# Patient Record
Sex: Male | Born: 1935 | Race: Black or African American | Hispanic: No | Smoking: Never smoker
Health system: Southern US, Community
[De-identification: ages and names within clinical notes are randomized; demographics above are authoritative.]

## PROBLEM LIST (undated history)

## (undated) DIAGNOSIS — Z973 Presence of spectacles and contact lenses: Secondary | ICD-10-CM

## (undated) DIAGNOSIS — G8929 Other chronic pain: Secondary | ICD-10-CM

## (undated) DIAGNOSIS — E785 Hyperlipidemia, unspecified: Secondary | ICD-10-CM

## (undated) DIAGNOSIS — K625 Hemorrhage of anus and rectum: Secondary | ICD-10-CM

## (undated) DIAGNOSIS — M549 Dorsalgia, unspecified: Secondary | ICD-10-CM

## (undated) DIAGNOSIS — E291 Testicular hypofunction: Secondary | ICD-10-CM

## (undated) DIAGNOSIS — G4733 Obstructive sleep apnea (adult) (pediatric): Secondary | ICD-10-CM

## (undated) DIAGNOSIS — Z9989 Dependence on other enabling machines and devices: Secondary | ICD-10-CM

## (undated) DIAGNOSIS — N529 Male erectile dysfunction, unspecified: Secondary | ICD-10-CM

## (undated) DIAGNOSIS — N183 Chronic kidney disease, stage 3 unspecified: Secondary | ICD-10-CM

## (undated) DIAGNOSIS — K649 Unspecified hemorrhoids: Secondary | ICD-10-CM

## (undated) DIAGNOSIS — M109 Gout, unspecified: Secondary | ICD-10-CM

## (undated) DIAGNOSIS — K219 Gastro-esophageal reflux disease without esophagitis: Secondary | ICD-10-CM

## (undated) DIAGNOSIS — M1990 Unspecified osteoarthritis, unspecified site: Secondary | ICD-10-CM

## (undated) DIAGNOSIS — N401 Enlarged prostate with lower urinary tract symptoms: Secondary | ICD-10-CM

## (undated) DIAGNOSIS — I10 Essential (primary) hypertension: Secondary | ICD-10-CM

## (undated) DIAGNOSIS — K5790 Diverticulosis of intestine, part unspecified, without perforation or abscess without bleeding: Secondary | ICD-10-CM

## (undated) HISTORY — PX: INCISION AND DRAINAGE PERIRECTAL ABSCESS: SHX1804

## (undated) HISTORY — PX: ANAL FISTULOTOMY: SHX6423

## (undated) HISTORY — DX: Male erectile dysfunction, unspecified: N52.9

## (undated) HISTORY — PX: CATARACT EXTRACTION W/ INTRAOCULAR LENS  IMPLANT, BILATERAL: SHX1307

## (undated) HISTORY — PX: TOE SURGERY: SHX1073

## (undated) HISTORY — DX: Testicular hypofunction: E29.1

## (undated) HISTORY — DX: Unspecified osteoarthritis, unspecified site: M19.90

## (undated) HISTORY — DX: Dorsalgia, unspecified: M54.9

## (undated) HISTORY — DX: Hyperlipidemia, unspecified: E78.5

## (undated) HISTORY — PX: OTHER SURGICAL HISTORY: SHX169

## (undated) HISTORY — DX: Other chronic pain: G89.29

## (undated) HISTORY — PX: INGUINAL HERNIA REPAIR: SUR1180

---

## 1981-12-23 HISTORY — PX: TRANSURETHRAL RESECTION OF PROSTATE: SHX73

## 1991-12-24 HISTORY — PX: UVULOPALATOPHARYNGOPLASTY: SHX827

## 1999-07-05 ENCOUNTER — Ambulatory Visit (HOSPITAL_COMMUNITY): Admission: RE | Admit: 1999-07-05 | Discharge: 1999-07-05 | Payer: Self-pay | Admitting: Internal Medicine

## 1999-07-05 ENCOUNTER — Encounter: Payer: Self-pay | Admitting: Internal Medicine

## 2001-04-10 ENCOUNTER — Ambulatory Visit (HOSPITAL_COMMUNITY): Admission: RE | Admit: 2001-04-10 | Discharge: 2001-04-10 | Payer: Self-pay | Admitting: Dentistry

## 2001-04-10 ENCOUNTER — Encounter: Payer: Self-pay | Admitting: Dentistry

## 2001-07-29 ENCOUNTER — Encounter (INDEPENDENT_AMBULATORY_CARE_PROVIDER_SITE_OTHER): Payer: Self-pay | Admitting: *Deleted

## 2001-07-29 ENCOUNTER — Ambulatory Visit (HOSPITAL_COMMUNITY): Admission: RE | Admit: 2001-07-29 | Discharge: 2001-07-30 | Payer: Self-pay | Admitting: Otolaryngology

## 2001-07-29 ENCOUNTER — Encounter: Payer: Self-pay | Admitting: Otolaryngology

## 2001-07-29 HISTORY — PX: NASAL SINUS SURGERY: SHX719

## 2003-02-15 ENCOUNTER — Ambulatory Visit (HOSPITAL_BASED_OUTPATIENT_CLINIC_OR_DEPARTMENT_OTHER): Admission: RE | Admit: 2003-02-15 | Discharge: 2003-02-15 | Payer: Self-pay | Admitting: Internal Medicine

## 2003-03-22 ENCOUNTER — Inpatient Hospital Stay (HOSPITAL_COMMUNITY): Admission: EM | Admit: 2003-03-22 | Discharge: 2003-03-24 | Payer: Self-pay | Admitting: Emergency Medicine

## 2003-03-22 ENCOUNTER — Encounter: Payer: Self-pay | Admitting: General Surgery

## 2003-05-30 ENCOUNTER — Ambulatory Visit (HOSPITAL_BASED_OUTPATIENT_CLINIC_OR_DEPARTMENT_OTHER): Admission: RE | Admit: 2003-05-30 | Discharge: 2003-05-30 | Payer: Self-pay | Admitting: Internal Medicine

## 2003-07-04 ENCOUNTER — Ambulatory Visit (HOSPITAL_COMMUNITY): Admission: RE | Admit: 2003-07-04 | Discharge: 2003-07-04 | Payer: Self-pay | Admitting: General Surgery

## 2003-07-04 ENCOUNTER — Encounter (INDEPENDENT_AMBULATORY_CARE_PROVIDER_SITE_OTHER): Payer: Self-pay | Admitting: Specialist

## 2003-12-14 ENCOUNTER — Encounter: Payer: Self-pay | Admitting: Gastroenterology

## 2004-05-22 ENCOUNTER — Encounter: Admission: RE | Admit: 2004-05-22 | Discharge: 2004-05-22 | Payer: Self-pay | Admitting: Internal Medicine

## 2004-06-12 ENCOUNTER — Ambulatory Visit (HOSPITAL_COMMUNITY): Admission: RE | Admit: 2004-06-12 | Discharge: 2004-06-12 | Payer: Self-pay | Admitting: Internal Medicine

## 2004-08-14 ENCOUNTER — Ambulatory Visit (HOSPITAL_BASED_OUTPATIENT_CLINIC_OR_DEPARTMENT_OTHER): Admission: RE | Admit: 2004-08-14 | Discharge: 2004-08-14 | Payer: Self-pay | Admitting: Surgery

## 2004-08-14 ENCOUNTER — Ambulatory Visit (HOSPITAL_COMMUNITY): Admission: RE | Admit: 2004-08-14 | Discharge: 2004-08-14 | Payer: Self-pay | Admitting: Surgery

## 2004-08-17 ENCOUNTER — Encounter: Admission: RE | Admit: 2004-08-17 | Discharge: 2004-08-17 | Payer: Self-pay | Admitting: Internal Medicine

## 2004-09-27 ENCOUNTER — Ambulatory Visit (HOSPITAL_COMMUNITY): Admission: RE | Admit: 2004-09-27 | Discharge: 2004-09-27 | Payer: Self-pay | Admitting: Internal Medicine

## 2006-09-03 ENCOUNTER — Emergency Department (HOSPITAL_COMMUNITY): Admission: EM | Admit: 2006-09-03 | Discharge: 2006-09-04 | Payer: Self-pay | Admitting: Emergency Medicine

## 2007-01-17 ENCOUNTER — Emergency Department (HOSPITAL_COMMUNITY): Admission: EM | Admit: 2007-01-17 | Discharge: 2007-01-18 | Payer: Self-pay | Admitting: Emergency Medicine

## 2008-04-04 ENCOUNTER — Ambulatory Visit: Payer: Self-pay | Admitting: Gastroenterology

## 2008-05-05 DIAGNOSIS — I10 Essential (primary) hypertension: Secondary | ICD-10-CM | POA: Insufficient documentation

## 2008-05-05 DIAGNOSIS — Z9889 Other specified postprocedural states: Secondary | ICD-10-CM | POA: Insufficient documentation

## 2008-05-05 DIAGNOSIS — M129 Arthropathy, unspecified: Secondary | ICD-10-CM | POA: Insufficient documentation

## 2008-05-05 DIAGNOSIS — K5900 Constipation, unspecified: Secondary | ICD-10-CM | POA: Insufficient documentation

## 2008-05-05 DIAGNOSIS — G4730 Sleep apnea, unspecified: Secondary | ICD-10-CM | POA: Insufficient documentation

## 2008-05-09 ENCOUNTER — Ambulatory Visit: Payer: Self-pay | Admitting: Gastroenterology

## 2008-05-09 DIAGNOSIS — R12 Heartburn: Secondary | ICD-10-CM

## 2008-05-09 DIAGNOSIS — R142 Eructation: Secondary | ICD-10-CM

## 2008-05-09 DIAGNOSIS — R141 Gas pain: Secondary | ICD-10-CM

## 2008-05-09 DIAGNOSIS — R143 Flatulence: Secondary | ICD-10-CM

## 2010-01-16 ENCOUNTER — Ambulatory Visit (HOSPITAL_BASED_OUTPATIENT_CLINIC_OR_DEPARTMENT_OTHER): Admission: RE | Admit: 2010-01-16 | Discharge: 2010-01-16 | Payer: Self-pay | Admitting: Orthopedic Surgery

## 2011-05-07 NOTE — Letter (Signed)
April 04, 2008    Mr. Marvin West   RE:  Marvin West, Marvin West  MRN:  [id number]  /  DOB:  06/04/1934   Dear Mr. Kyllonen:   It is my pleasure to have treated you recently as a new patient in my  office.  I appreciate your confidence and the opportunity to participate  in your care.   Since I do have a busy inpatient endoscopy schedule and office schedule,  my office hours vary weekly.  I am, however, available for emergency  calls every day through my office.  If I cannot promptly meet an urgent  office appointment, another one of our gastroenterologists will be able  to assist you.   My well-trained staff are prepared to help you at all times.  For  emergencies after office hours, a physician from our gastroenterology  section is always available through my 24-hour answering service.   While you are under my care, I encourage discussion of your questions  and concerns, and I will be happy to return your calls as soon as I am  available.   Once again, I welcome you as a new patient and I look forward to a happy  and healthy relationship.    Sincerely,      Barbette Hair. Arlyce Dice, MD,FACG  Electronically Signed   RDK/MedQ  DD: 04/04/2008  DT: 04/04/2008  Job #: [id number]

## 2011-05-07 NOTE — Assessment & Plan Note (Signed)
[facility]                         GASTROENTEROLOGY OFFICE NOTE   NAME:West, Marvin SKEENS                        MRN:          [id number]  DATE:04/04/2008                            DOB:          Jun 18, 1934    PROBLEM:  Abdominal bloating and constipation.   Marvin West is a 77 year old African American man here for evaluation of  abdominal bloating.  Marvin West suffers from mild chronic constipation.  Marvin West has  significant bloating daily which is only relieved after a good bowel  movement.  Marvin West has taken various over-the-counter remedies for his  constipation.  This has been on going problem for years.  If Marvin West gets  severely constipated then Marvin West develops pyrosis.  Marvin West apparently has had  colonoscopy within the last 3-4 years, though I have no documentation of  this.  Marvin West rarely sees blood in the toilet after straining.  Marvin West is status  post hemorrhoidectomy and fissurectomy in 2004, 2005.   PAST MEDICAL HISTORY:  1. Pertinent for hypertension.  2. Arthritis.  3. Sleep apnea.  4. Status post herniorrhaphy.   FAMILY HISTORY:  Noncontributory.   MEDICATIONS:  Include hydrochlorothiazide, Avapro, nabumetone, Caduet,  fexofenadine, metoprolol, and steroid nasal spray.   Marvin West is allergic to SULFA.   Marvin West does not smoke cigarettes.  Marvin West has about a drink a week.  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   REVIEW OF SYSTEMS:  Positive for joint pains and arthritis.  Otherwise,  other systems are negative.   PHYSICAL EXAMINATION:  Marvin West is a healthy-appearing male.  Pulse 64.  Blood pressure 104/70.  Weight 188.  HEENT:  EOMI.  PERRLA.  Sclerae are anicteric.  Conjunctivae are pink.  NECK:  Supple without thyromegaly, adenopathy or carotid bruits.  CHEST:  Clear to auscultation and percussion without adventitious  sounds.  CARDIAC:  Regular rhythm; normal S1 S2.  There are no murmurs, gallops  or rubs.  ABDOMEN:  Marvin West has mildly tympanotic and protuberant abdomen.  There are  no abdominal masses or organomegaly.  EXTREMITIES:  Full range of motion.  No cyanosis, clubbing or edema.  RECTAL:  Deferred.  MUSCULOSKELETAL:  There are no deformities.  NEUROLOGIC:  Marvin West is alert and without focal abnormalities.   IMPRESSION:  Abdominal bloating.  This is probably related to  constipation.  Marvin West defines constipation is having an incomplete bowel  movement, although Marvin West does move daily.  Structural lesion in  the colon  is much less likely in view of his colonoscopy if, in fact, Marvin West has had  one the last 2-3 years.   RECOMMENDATION:  1. Fiber supplementation.  2. Amitiza 24 mcg twice a day as needed.     Barbette Hair. Arlyce Dice, MD,FACG  Electronically Signed    RDK/MedQ  DD: 04/04/2008  DT: 04/04/2008  Job #: [id number]   cc:   Thora Lance, M.D.

## 2011-05-07 NOTE — Letter (Signed)
April 04, 2008    Thora Lance, M.D.  [address]  [place], [place] [address]   RE:  Marvin West, Marvin West  MRN:  [id number]  /  DOB:  Nov 14, 1934   Dear Dr. Valentina Lucks:   Upon your kind referral, I had the pleasure of evaluating your patient  and I am pleased to offer my findings.  I saw Marvin West in the office  today.  Enclosed is a copy of my progress note that details my findings  and recommendations.   Thank you for the opportunity to participate in your patient's care.    Sincerely,      Barbette Hair. Arlyce Dice, MD,FACG  Electronically Signed    RDK/MedQ  DD: 04/04/2008  DT: 04/04/2008  Job #: [id number]

## 2011-05-10 NOTE — Op Note (Signed)
NAME:  Marvin West, Marvin West                             ACCOUNT NO.:  [id number]   MEDICAL RECORD NO.:  [id number]                   PATIENT TYPE:  INP   LOCATION:  5727                                 FACILITY:  [facility]   PHYSICIAN:  Gabrielle Dare. Janee Morn, M.D.             DATE OF BIRTH:  01-07-1934   DATE OF PROCEDURE:  03/22/2003  DATE OF DISCHARGE:                                 OPERATIVE REPORT   PREOPERATIVE DIAGNOSIS:  Perirectal abscess.   POSTOPERATIVE DIAGNOSES:  1. Perirectal abscess.  2. Internal hemorrhoids.   PROCEDURE:  Examination under anesthesia and I & D of perirectal abscess.   SURGEON:  Gabrielle Dare. Janee Morn, M.D.   ANESTHESIA:  General.   HISTORY OF PRESENT ILLNESS:  The patient is a 77 year old African-American  male who presented to our urgent office and saw Dr. Jerelene Redden with a 5-6  day history of perirectal swelling and pain, which has been increasing.  He  was diagnosed there with a perirectal abscess and referred to myself for  incision and drainage in the operating room.   PROCEDURE IN DETAIL:  The patient was brought to the operating room. He had  received intravenous antibiotics.  General anesthesia was administered.  He  was placed in the lithotomy position, and his perirectal area was prepped  and draped in a sterile fashion.  Initially, a digital rectal exam revealed  no abnormalities.  On anoscopy, he was noted to have some small internal  hemorrhoids that were not significantly inflamed.  No internal fistula  opening was noted.  Local anesthetic in the form of 0.5% Marcaine with  epinephrine was injected into the abscess area, and no effluent returned  from any portion of the rectal mucosa.  The anoscope was removed, and  subsequently, the most fluctuant area of this perirectal abscess was incised  sharply.  At this point, a 2 cm incision was made.  Copious purulent  material was returned, which was sent for culture.  Using blunt dissection,  the  abscess cavity was opened completely.  It did track around it in a  horseshoe abscess fashion across the midline, only for a very small length,  approximately 0.5 cm, going over to the left side.  All cavities were  connected.  The area was copiously irrigated with saline.  Meticulous  hemostasis was obtained with Bovie cautery.  The area was again injected  with 0.5% Marcaine with epinephrine for postoperative pain relief and then  packed with Iodoform packing and gauze. A sterile dressing was then applied.  Instrument, sponge, and needle counts were correct.  The patient was taken  to the recovery room in stable condition.  Gabrielle Dare Janee Morn, M.D.    BET/MEDQ  D:  03/22/2003  T:  03/23/2003  Job:  [id number]

## 2011-05-10 NOTE — Op Note (Signed)
[place]  Patient:    Marvin West, Marvin West                          MRN: [id number] Adm. Date:  [id number] Attending:  Waldon Merl CC:         Thora Lance, M.D., [facility] at [facility], [address], [place] [address]   Operative Report  PREOPERATIVE DIAGNOSIS:  Right ethmoid and maxillary sinusitis with nasal and maxillary and ethmoid polyposis, rule out inverted papilloma, rule out _____ rule out malignancy.  POSTOPERATIVE DIAGNOSIS:  Right ethmoid and maxillary sinusitis with nasal polyposis and no evidence of malignancy or inverted papilloma on frozen section.  PROCEDURES:  Functional endoscopic sinus surgery, right ethmoidectomy, and a right maxillary sinus ostial enlargement with antrostomy, with polypectomy, with partial removal of medial sinus wall and reduction of turbinates.  SURGEON:  Keturah Barre, M.D.  ANESTHESIA:  General with Marvin West, M.D.  DESCRIPTION OF PROCEDURE:  Patient placed in a supine position.  Under general anesthesia, the nose was anesthetized using 1% Xylocaine with epinephrine and topical cocaine 200 mg.  The polyp was extremely large, pushing the middle turbinate and the septum to the left, and this was in direct contact and had eroded the medial wall of the maxillary sinus near the natural ostium of the maxillary sinus and up into the ethmoid sinus.  This polypoid debris was removed, and this was also removed into the inferior ethmoid sinus near the ethmoid bulla, and considerable tissue was sent for frozen section.  Our dissection of the ethmoid was continued using the 0 degree scope and the 70 degree scope with the upbiting and straight Blakesley-Wildes, and the ethmoid sinus _____ was taken down so we could see into the frontal sinus, and we could see posteriorly and superiorly.  All tissues left behind were completely normal, and during this  procedure and during the time we were using the scope, we had analyzed and evaluated our margins so we know exactly where we can remove this if we have a malignancy or inverted papilloma result.  Once we had completed the ethmoid plane of resection of this polypoid debris, we also saw a considerable of purulent drainage coming from the maxillary sinus, suctioned it, and cultured it for fungal, anaerobic, and aerobic.  Once we suctioned the sinus, then we could work our way down to this natural ostium in the maxillary sinus where the considerable portion of the bony maxillary sinus medial wall was eroded, and this was taken along with the soft tissue, as this was all severely diseased, thickened, and irregular-appearing polypoid tissue.  Before we got the frozen section, I felt that this most likely was inverted papilloma but when the frozen section was called back, it was found to be inflammatory polyposis only.  We therefore removed the remainder of the nasal and maxillary sinus polyps.  We did an antrostomy on the sinus.  We irrigated the maxillary sinus gently and then suctioned it out clear.  As we looked into the maxillary sinus mucous membrane, it really looked quite reasonable at its more inferior and lateral wall, though there was considerable thickening as it was sitting right in contact with purulent material.  Once this was cleared and the natural ostium was made approximately 10 times larger than its normal size because of the extensive bony erosion and the tissues that were so irreversibly changed  that were removed with the specimen, then we packed using Gelfoam.  We also used iodoform packing in the natural ostium as well as a Merocel pack.  The patient was to be kept overnight for observation and did extremely well during the anesthesia.  The follow-up will be tomorrow, and then he will be followed next week and then in two weeks, four weeks, six weeks, three months, six  months, and a year. DD:  07/29/01 TD:  07/29/01 Job: [id number]

## 2011-05-10 NOTE — Op Note (Signed)
NAME:  Marvin West, Marvin West                           ACCOUNT NO.:  [id number]   MEDICAL RECORD NO.:  [id number]                   PATIENT TYPE:  AMB   LOCATION:  DAY                                  FACILITY:  [facility]   PHYSICIAN:  Gabrielle Dare. Janee Morn, M.D.             DATE OF BIRTH:  12/02/1934   DATE OF PROCEDURE:  07/04/2003  DATE OF DISCHARGE:                                 OPERATIVE REPORT   PREOPERATIVE DIAGNOSES:  1. Anal fistula.  2. Anal skin tags.  3. Internal hemorrhoids.   POSTOPERATIVE DIAGNOSES:  1. Anal fistula.  2. Anal skin tags.  3. Internal hemorrhoids.   PROCEDURES:  1. Endorectal ultrasound.  2. Examination under anesthesia.  3. Anal fistulotomy.  4. Banding of internal hemorrhoids.  5. Excision of anal skin tags.   SURGEONS:  1. Gabrielle Dare. Janee Morn, M.D.  2. Marcial Pacas E. Earlene Plater, M.D.   ANESTHESIA:  General.   HISTORY OF PRESENT ILLNESS:  The patient is a 77 year old, African-American  male, whom I initially met when he presented with a very large, horseshoe  perirectal abscess which was drained operatively when he was in the hospital  for a couple of days.  On follow-up, once the inflammation cooled down, we  were able to find an anal fistula as the likely cause.  He initially just  wanted to do some local wound care and not have it treated operatively, but  it has persisted and is bothering him.  He is also troubled by some anal  skin tags from hemorrhoids in the past as well as one internal hemorrhoid.  He presents today for anorectal ultrasound examination under anesthesia,  anal fistulotomy, treatment of his internal hemorrhoid, and excision of his  anal skin tags.   PROCEDURE IN DETAIL:  Informed consent was obtained.  He received  intravenous antibiotics and was taken to the operating room.  General  anesthesia was administered.  He was placed in lithotomy and first Timothy  E. Earlene Plater, M.D. proceeded with indirect ultrasound.  There was a small tract  visualized posteriorly, likely the fistula tract.  There was no surrounding  collection of fluid or other evidence of abscess.  No other abnormalities  were noted.  That will be dictated by Dr. Earlene Plater under a separate note.  Subsequently, the area was prepped and draped in a sterile fashion.  He was  noted to have a large amount of anal skin tags, especially around the left  side from the anterior to posterior.  Two large flaps were noted.  Subsequently, I made endoscopic exam.  Laterally and anteriorly, things  looked pretty normal.  Posteriorly, he had one internal hemorrhoid which was  pretty large.  He also had the opening of his anal fistula.  The fistula  also had an opening posterolaterally with some granulation tissue heaped up  around there.  A probe was inserted through this area and  exited into the  rectum at the opening internally.  We then proceeded to fillet open this  fistula with the Bovie cautery.  Once that was accomplished, the very heaped  up granulation tissue was excised, and the whole fistula tract was  cauterized to fulgurate it nicely and obtained good hemostasis.  Once that  was accomplished, we directed our attention to the internal hemorrhoid.  This was grasped with forceps and the hemorrhoid banding tool was used to  apply one rubber band and hemorrhoid was nicely secured and was about 1 cm  in size.  The band was in good position around its base.  Subsequently, I  directed my attention to excision of the anal skin tags.  This was done  sharply, and there were two large tags, starting anteriorly along the left  side and then extending posteriorly.  Both of these were removed and sent to  pathology.  Hemostasis was obtained with Bovie cautery and then the anoderm  was reapproximated with a series of interrupted 3-0 Vicryl sutures.  The  area was copiously irrigated.  Meticulous hemostasis was ensured, and two  Gelfoam sponges were inserted for postoperative  dressing.  Please note that  0.5% Marcaine mixed with 1% lidocaine with epinephrine was noted for local  anesthesia, and the area was infiltrated before the initial anoscopy.  Sterile dressings were applied.  Sponge, needle, and instrument counts were  correct, and he was taken to the recovery room in stable condition.                                               Gabrielle Dare Janee Morn, M.D.    BET/MEDQ  D:  07/04/2003  T:  07/04/2003  Job:  [id number]

## 2011-05-10 NOTE — Op Note (Signed)
   NAME:  Marvin West, Marvin West                           ACCOUNT NO.:  [id number]   MEDICAL RECORD NO.:  [id number]                   PATIENT TYPE:  AMB   LOCATION:  DAY                                  [facility]ACILITY[facility]   PHYSICIAN:  Timothy E. Earlene Plater, M.D.              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  2. Hemorrhoids.   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  2. Hemorrhoids.   PROCEDURE:  Anorectal ultrasonography.   SURGEON:  Timothy E. Earlene Plater, M.D.   ASSISTANT:  Gabrielle Dare. Janee Morn, M.D.   ANESTHESIA:  General.   INDICATIONS FOR PROCEDURE:  See the details on Mr. Schubring from Dr. Carollee Massed  report.  He is known to have a remote perirectal abscess and a current  fistula-in-ano as well hemorrhoids.  I have been asked to accomplish a  rectal ultrasonography.   DESCRIPTION OF PROCEDURE:  The patient is asleep in lithotomy position.  The  perianal area is carefully inspected.  Gentle digital exam of the rectum.  To the right posterior of the posterior midline, approximately 1.5 cm  outside the anal verge, is an obvious fistula-in-ano.  Digital exam reveals  no intrarectal abnormalities.  The solid anal probe model 2102 BNK  ultrasonography is lubricated and inserted gently to the anus, and multiple  images are made.  I think that a right posterior to the posterior midline  fistula is demonstrated.  There is no apparent accumulation or abscess  cavity.  This is an irregular, hyperechoic serpiginous apparent fistula.  There are no other abnormalities noted on this exam.                                               Timothy E. Earlene Plater, M.D.    TED/MEDQ  D:  07/04/2003  T:  07/04/2003  Job:  [id number]

## 2011-05-10 NOTE — Op Note (Signed)
NAME:  Marvin West, Marvin West                           ACCOUNT NO.:  [id number]   MEDICAL RECORD NO.:  [id number]                   PATIENT TYPE:  AMB   LOCATION:  DSC                                  FACILITY:  [facility]   PHYSICIAN:  Abigail Miyamoto, M.D.              DATE OF BIRTH:  1934/10/16   DATE OF PROCEDURE:  08/14/2004  DATE OF DISCHARGE:                                 OPERATIVE REPORT   PREOPERATIVE DIAGNOSIS:  Right inguinal hernia.   POSTOPERATIVE DIAGNOSIS:  Right inguinal hernia.   PROCEDURE:  Right inguinal hernia repair with mesh.   SURGEON:  Abigail Miyamoto, M.D.   ANESTHESIA:  Marcaine 0.25% and LMA.   ESTIMATED BLOOD LOSS:  Minimal.   INDICATIONS FOR PROCEDURE:  Marvin West is a 77 year old gentleman who  presents with a right inguinal hernia.  Preoperatively he was found to have  a creatinine of 2.3. I discussed this with his primary care physician, Dr.  Thora Lance, and the patient does have some chronic renal insufficiency.  At this point, we discussed this with anesthesia as well and all agreed that  we could proceed with hernia repair.   FINDINGS:  The patient was found to have a moderate sized indirect inguinal  hernia.   DESCRIPTION OF PROCEDURE:  The patient was brought to the operating room and  identified as Marvin West.  He is placed supine on the operating table and  anesthesia was induced.  His abdomen was then prepped and draped in the  usual sterile fashion.  Marcaine 0.5% was then used to perform the  ilioinguinal nerve block as well as anesthetize the skin and the right  groin.  Next, a longitudinal incision was made in the right groin with #15  blade.  Incision was carried down through Scarpa's fascia with the  electrocautery. The external oblique fascia was then identified and opened  with cautery, then further with the Metzenbaum scissors toward the internal  and external rings.  The testicular cord and its structures were then easily  identified and dissected free and controlled with a Penrose drain.  The  inguinal floor was found to be strong and intact.  The patient had a  moderate sized indirect hernia sac in the cord structures.  The hernia sac  was identified and controlled with hemostats and then separated from the  surrounding cord structures with blunt dissection as well as with the  electrocautery.  The sac was opened up and no contents were found to be  contained from the peritoneal cavity.  The base of the hernia sac was then  dissected free and closed with a 2-0 silk suture. The redundant sac was then  excised with electrocautery.  The preperitoneal space was then dissected out  with finger dissection.  A piece of large Prolene mesh from the [employer]  Prolene Hernia System was brought onto the field.  The preperitoneal portion  was then placed at the internal ring and opened up at the preperitoneal  space.  The overlay portion was then opened up into the groin.  The mesh was  then sewn in place into the right inguinal floor by first sewing it to the  pubic tubercle with a 2-0 Vicryl suture.  It was then sewn to the  transversalis fascia with 2-0 Vicryl sutures and the shelving edge of the  inguinal ligament with 2-0 Vicryl sutures.  A slit was then cut in the mesh  and it was incorporated around the cord structures with the 2-0 Vicryl as  well.  Good coverage of the inguinal floor appeared to be achieved.  The  external oblique fascia was then closed over this with a running 2-0 Vicryl  suture as well.  Scarpa's fascia was closed with interrupted 3-0 Vicryl  sutures and skin was closed with running 3-0 Vicryl suture as well.  Steri-  Strips, gauze and tape were then applied.  The patient tolerated the procedure well.  All sponge, needle and instrument  counts were correct at the end of the procedure. The patient was then  extubated in the operating room and taken in a stable condition to the  recovery  room.                                               Abigail Miyamoto, M.D.    DB/MEDQ  D:  08/14/2004  T:  08/14/2004  Job:  [id number]

## 2011-05-10 NOTE — H&P (Signed)
NAME:  Marvin West, Marvin West                             ACCOUNT NO.:  [id number]   MEDICAL RECORD NO.:  [id number]                   PATIENT TYPE:  INP   LOCATION:  1844                                 FACILITY:  [facility]   PHYSICIAN:  Gabrielle Dare. Janee Morn, M.D.             DATE OF BIRTH:  11/04/1934   DATE OF ADMISSION:  03/22/2003  DATE OF DISCHARGE:                                HISTORY & PHYSICAL   CHIEF COMPLAINT:  Perirectal abscess.   HISTORY OF PRESENT ILLNESS:  The patient is a 77 year old African American  male who developed some pain and swelling in his perianal area on the right  side approximately five to six days ago.  He has increasing pain and size of  the tender area.  He was evaluated in our urgent office at [facility] by Dr. Gita Kudo, referred for operative intervention.  The  patient has not noted any blood per rectum and he denies any bowel  complaints.  He just complains of pain in the area and he has not had any  drainage.   PAST MEDICAL HISTORY:  This includes hypertension and hypercholesterolemia.   PAST SURGICAL HISTORY:  This includes multiple chest wall mass.   MEDICATIONS:  Lipitor, Allegra, Labetalol, hydrochlorothiazide, Norvasc, and  aspirin.   SOCIAL HISTORY:  He does not smoke or drink alcohol.  He is married.   ALLERGIES:  SULFA.   REVIEW OF SYSTEMS:  In general, no complaints.  CARDIOVASCULAR:  No  complaints.  PULMONARY:  No complaints.  GI:  No complaints, please refer to  the history of present illness.  MUSCULOSKELETAL:  He has no complaints.  The rest of the review of systems is negative.   PHYSICAL EXAMINATION:  GENERAL APPEARANCE:  He is awake, alert and in mild  discomfort.  VITAL SIGNS:  Temperature 98.2, blood pressure 155/78, pulse 116,  respiratory rate 20.  HEENT:  His extraocular muscles are intact.  Pupils are equal and reactive.  NECK:  Supple.  CHEST:  Clear to auscultation bilaterally.  CARDIOVASCULAR:   Regular rate and rhythm.  ABDOMEN:  Soft, nontender and nondistended.  RECTAL:  The patient is refusing internal rectal examination secondary to  his pain.  He does have a very tender, warm fluctuant area in his right  perianal region.  There is no active discharge and rectal examination is  deferred until the operating room.  This area is about 5 cm in size.   LABORATORY DATA:  The laboratory studies reviewed show his white blood cell  count is 16,700, hemoglobin 13, platelets 213.  A CMP and urinalysis are  pending.   ASSESSMENT:  Perirectal abscess.    PLAN:  Take him to the operating room for an emergent incision and drainage.  We will give him intravenous antibiotics and plan on admitting him at least  over night.  The procedure risks and  benefits were discussed with the  patient and he is eager to proceed.                                               Gabrielle Dare Janee Morn, M.D.    BET/MEDQ  D:  03/22/2003  T:  03/22/2003  Job:  [id number]

## 2011-05-10 NOTE — Discharge Summary (Signed)
   NAME:  Marvin West, Marvin West                             ACCOUNT NO.:  [id number]   MEDICAL RECORD NO.:  [id number]                   PATIENT TYPE:  INP   LOCATION:  5727                                 FACILITY:  [facility]   PHYSICIAN:  Gabrielle Dare. Janee Morn, M.D.             DATE OF BIRTH:  May 05, 1934   DATE OF ADMISSION:  03/22/2003  DATE OF DISCHARGE:  03/24/2003                                 DISCHARGE SUMMARY   DISCHARGE DIAGNOSIS:  Status post incision and drainage of perirectal  abscess.   HISTORY OF PRESENT ILLNESS:  The patient is a 77 year old African-American  male who had increasing pain and swelling for five to six days in his  perirectal area.  He was evaluated by Dr. Gita Kudo in our urgent  office and he referred him to myself at [facility] for an urgent operative  incision and drainage of this perirectal abscess.   HOSPITAL COURSE:  The patient was admitted to hospital through the emergency  department and brought to the operating where he underwent examination under  anesthesia and incision of drainage of a perirectal abscess.  He tolerated  procedure well.  Postoperatively, his packing was changed and he remained  hemodynamically stable.  He is initially had a fever of 102 the night of  surgery. He gradually defervesced. He was maintained on intravenous Cipro  and Flagyl.  He continued to tolerate his diet.  He is placed on his home  medications by Dr. Valentina Lucks who was kind enough to come by and see the  patient while he was here and otherwise had some initial difficulty passing  his urine, but that resolved as well.  Home health services was consulted  and they set up home dressing changes for packing of the wound and he was  switched to p.o. antibiotics, remained afebrile and is discharged home in  stable condition.   DISCHARGE DIET:  As tolerated.   DISCHARGE ACTIVITY:  As tolerated with home  health for wound care, packing  his small wound opening daily.  The  patient is also going to take sitz bath  daily prior to the daily change.   FOLLOW UP:  The patient will follow up with me in the office next Wednesday  morning.                                               Gabrielle Dare Janee Morn, M.D.    BET/MEDQ  D:  03/24/2003  T:  03/25/2003  Job:  [id number]

## 2011-05-10 NOTE — H&P (Signed)
[place]  Patient:    Marvin West, Marvin West                          MRN: [id number] Adm. Date:  [id number] Attending:  Waldon Merl CC:         Thora Lance, M.D.   History and Physical  This patient is a 77 year old male who enters with a history of sinusitis problems.  He has been treated with antibiotics on several occasions for the sinus difficulties and a CAT scan was done after he did not resolve this very well and he had continuing sinus purulent drainage.  It showed soft tissue opacification of the right maxillary antrum with soft tissue extending through the medial antral wall.  They also showed involvement in the ethmoid sinus inferiorly and some slight changes even in the right frontal sinus.  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 He has had a history of deviated nasal septum and obstructive sleep apnea.  He has also had a history of allergic rhinitis and smokes tobacco occasionally. ALLERGIES:  SULFA.  MEDICATIONS: 1. Avapro 300 mg. 2. Lipitor 20 mg h.s. 3. Labetalol 200 mg b.i.d. 4. Hydrochlorothiazide 25 mg q.a.m. 5. Allegra 60 mg. 6. Androgen 1%. 7. ______ 0.5% q.a.m.  PAST MEDICAL HISTORY:  Blood pressure elevation.  He has had no evidence of any myocardial infarction.  His EKG was normal sinus rhythm.  His blood work shows a hemoglobin of 11.9 and a hematocrit of 35.4 with normal platelets and a normal electrolyte status.  PAST SURGICAL HISTORY:  Prostate surgery in 1980, surgery for sleep apnea in 1993, toe surgery right and left in 1995.  He had a chest lumpectomy done in 1970s which was benign.  REVIEW OF SYSTEMS:  His respiratory status is really quite excellent. UROLOGIC:  Excellent.  ENDOCRINE:  Stable.  PHYSICAL EXAMINATION  VITAL SIGNS:  Blood pressure 130/70, heart rate 88, weight  195 pounds, height 70 inches.  GENERAL:  He is a well-nourished, well-developed black male in no acute distress.  HEENT:  Ears are clear.  Tympanic membranes look excellent.  His nose shows swelling on the right side along the medial wall of the maxillary sinus and lateral nose.  His inferior turbinate appears to be in good condition and the middle turbinate appears to be quite medially pushed within the nose because of this fullness.  His left nose looks totally normal.  The septum is slightly pushed to the left also.  Nasopharynx is clear.  Oral cavity is also clear. No ulceration or mass.  True cords, false cords, epiglottis, face, and tongue are clear.  There is no evidence of any cranial nerve abnormalities.  True cord mobility is excellent.  EOMs are normal.  Tongue mobility, gag reflex, shoulder strength are all within normal limits.  NECK:  Free of any thyromegaly, cervical adenopathy, or mass.  CHEST:  Clear.  No rales, rhonchi, or wheezes.  CARDIOVASCULAR:  No ______ or gallops.  ABDOMEN:  Free of organomegaly, tenderness, or mass.  EXTREMITIES:  Unremarkable except for his previous toe surgery.  DIAGNOSES: 1. 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  Rule out malignancy.  Rule out    ______ mycosis, right side. 2. Hypertension. 3. Chronic constipation. 4. Throat tightness  symptom. 5. Sinusitis. DD:  07/29/01 TD:  07/29/01 Job: [id number]

## 2011-08-20 ENCOUNTER — Ambulatory Visit (INDEPENDENT_AMBULATORY_CARE_PROVIDER_SITE_OTHER): Payer: Medicare Other | Admitting: Psychology

## 2011-08-20 DIAGNOSIS — F4321 Adjustment disorder with depressed mood: Secondary | ICD-10-CM

## 2011-09-03 ENCOUNTER — Ambulatory Visit (INDEPENDENT_AMBULATORY_CARE_PROVIDER_SITE_OTHER): Payer: Medicare Other | Admitting: Psychology

## 2011-09-03 DIAGNOSIS — F4321 Adjustment disorder with depressed mood: Secondary | ICD-10-CM

## 2011-09-12 ENCOUNTER — Ambulatory Visit (INDEPENDENT_AMBULATORY_CARE_PROVIDER_SITE_OTHER): Payer: Medicare Other | Admitting: Psychology

## 2011-09-12 DIAGNOSIS — F4321 Adjustment disorder with depressed mood: Secondary | ICD-10-CM

## 2011-09-19 ENCOUNTER — Ambulatory Visit (INDEPENDENT_AMBULATORY_CARE_PROVIDER_SITE_OTHER): Payer: Medicare Other | Admitting: Psychology

## 2011-09-19 DIAGNOSIS — F4321 Adjustment disorder with depressed mood: Secondary | ICD-10-CM

## 2011-09-26 ENCOUNTER — Ambulatory Visit (INDEPENDENT_AMBULATORY_CARE_PROVIDER_SITE_OTHER): Payer: Medicare Other | Admitting: Psychology

## 2011-09-26 DIAGNOSIS — F4323 Adjustment disorder with mixed anxiety and depressed mood: Secondary | ICD-10-CM

## 2011-10-15 ENCOUNTER — Ambulatory Visit (INDEPENDENT_AMBULATORY_CARE_PROVIDER_SITE_OTHER): Payer: Medicare Other | Admitting: Psychology

## 2011-10-15 DIAGNOSIS — F4323 Adjustment disorder with mixed anxiety and depressed mood: Secondary | ICD-10-CM

## 2011-10-17 ENCOUNTER — Ambulatory Visit: Payer: Medicare Other | Admitting: Psychology

## 2011-10-22 ENCOUNTER — Ambulatory Visit (INDEPENDENT_AMBULATORY_CARE_PROVIDER_SITE_OTHER): Payer: Medicare Other | Admitting: Psychology

## 2011-10-22 DIAGNOSIS — F4323 Adjustment disorder with mixed anxiety and depressed mood: Secondary | ICD-10-CM

## 2011-10-31 ENCOUNTER — Ambulatory Visit (INDEPENDENT_AMBULATORY_CARE_PROVIDER_SITE_OTHER): Payer: Medicare Other | Admitting: Psychology

## 2011-10-31 DIAGNOSIS — F4323 Adjustment disorder with mixed anxiety and depressed mood: Secondary | ICD-10-CM

## 2011-11-07 ENCOUNTER — Ambulatory Visit: Payer: Medicare Other | Admitting: Psychology

## 2013-06-03 ENCOUNTER — Other Ambulatory Visit: Payer: Self-pay | Admitting: Gastroenterology

## 2013-07-06 ENCOUNTER — Encounter (HOSPITAL_COMMUNITY): Payer: Self-pay | Admitting: Pharmacy Technician

## 2013-07-07 ENCOUNTER — Encounter (HOSPITAL_COMMUNITY): Payer: Self-pay | Admitting: *Deleted

## 2013-07-27 ENCOUNTER — Ambulatory Visit (HOSPITAL_COMMUNITY): Payer: Medicare Other | Admitting: Anesthesiology

## 2013-07-27 ENCOUNTER — Encounter (HOSPITAL_COMMUNITY)
Admission: RE | Disposition: A | Discharge status: Home / Self Care | Payer: Self-pay | Source: Ambulatory Visit | Attending: Gastroenterology

## 2013-07-27 ENCOUNTER — Encounter (HOSPITAL_COMMUNITY): Payer: Self-pay | Admitting: *Deleted

## 2013-07-27 ENCOUNTER — Encounter (HOSPITAL_COMMUNITY): Payer: Self-pay | Admitting: Anesthesiology

## 2013-07-27 ENCOUNTER — Ambulatory Visit (HOSPITAL_COMMUNITY)
Admission: RE | Admit: 2013-07-27 | Discharge: 2013-07-27 | Disposition: A | Discharge status: Home / Self Care | Payer: Medicare Other | Source: Ambulatory Visit | Attending: Gastroenterology | Admitting: Gastroenterology

## 2013-07-27 DIAGNOSIS — G4733 Obstructive sleep apnea (adult) (pediatric): Secondary | ICD-10-CM | POA: Insufficient documentation

## 2013-07-27 DIAGNOSIS — K219 Gastro-esophageal reflux disease without esophagitis: Secondary | ICD-10-CM | POA: Insufficient documentation

## 2013-07-27 DIAGNOSIS — E785 Hyperlipidemia, unspecified: Secondary | ICD-10-CM | POA: Insufficient documentation

## 2013-07-27 DIAGNOSIS — N189 Chronic kidney disease, unspecified: Secondary | ICD-10-CM | POA: Insufficient documentation

## 2013-07-27 DIAGNOSIS — K5730 Diverticulosis of large intestine without perforation or abscess without bleeding: Secondary | ICD-10-CM | POA: Insufficient documentation

## 2013-07-27 DIAGNOSIS — I129 Hypertensive chronic kidney disease with stage 1 through stage 4 chronic kidney disease, or unspecified chronic kidney disease: Secondary | ICD-10-CM | POA: Insufficient documentation

## 2013-07-27 DIAGNOSIS — Z1211 Encounter for screening for malignant neoplasm of colon: Secondary | ICD-10-CM | POA: Insufficient documentation

## 2013-07-27 HISTORY — DX: Essential (primary) hypertension: I10

## 2013-07-27 HISTORY — PX: COLONOSCOPY WITH PROPOFOL: SHX5780

## 2013-07-27 HISTORY — DX: Unspecified osteoarthritis, unspecified site: M19.90

## 2013-07-27 HISTORY — DX: Gastro-esophageal reflux disease without esophagitis: K21.9

## 2013-07-27 SURGERY — COLONOSCOPY WITH PROPOFOL
Anesthesia: Monitor Anesthesia Care

## 2013-07-27 MED ORDER — PROPOFOL INFUSION 10 MG/ML OPTIME
INTRAVENOUS | Status: DC | PRN
  Administered 2013-07-27: 100 ug/kg/min via INTRAVENOUS

## 2013-07-27 MED ORDER — KETAMINE HCL 10 MG/ML IJ SOLN
INTRAMUSCULAR | Status: DC | PRN
  Administered 2013-07-27: 10 mg via INTRAVENOUS

## 2013-07-27 MED ORDER — LACTATED RINGERS IV SOLN
INTRAVENOUS | Status: DC
  Administered 2013-07-27: 1000 mL via INTRAVENOUS

## 2013-07-27 MED ORDER — ONDANSETRON HCL 4 MG/2ML IJ SOLN
INTRAMUSCULAR | Status: DC | PRN
  Administered 2013-07-27 (×2): 2 mg via INTRAVENOUS

## 2013-07-27 MED ORDER — MIDAZOLAM HCL 5 MG/5ML IJ SOLN
INTRAMUSCULAR | Status: DC | PRN
  Administered 2013-07-27 (×2): 0.5 mg via INTRAVENOUS

## 2013-07-27 MED ORDER — FENTANYL CITRATE 0.05 MG/ML IJ SOLN
INTRAMUSCULAR | Status: DC | PRN
  Administered 2013-07-27: 25 ug via INTRAVENOUS
  Administered 2013-07-27: 50 ug via INTRAVENOUS

## 2013-07-27 MED ORDER — SODIUM CHLORIDE 0.9 % IV SOLN: INTRAVENOUS | Status: DC

## 2013-07-27 MED ORDER — LACTATED RINGERS IV SOLN
INTRAVENOUS | Status: DC | PRN
  Administered 2013-07-27: 08:00:00 via INTRAVENOUS

## 2013-07-27 SURGICAL SUPPLY — 22 items

## 2013-07-27 NOTE — Transfer of Care (Signed)
Immediate Anesthesia Transfer of Care Note  Patient: Marvin West  Procedure(s) Performed: Procedure(s): COLONOSCOPY WITH PROPOFOL (N/A)  Patient Location: PACU  Anesthesia Type:MAC combined with regional for post-op pain  Level of Consciousness: awake, alert  and oriented  Airway & Oxygen Therapy: Patient Spontanous Breathing and Patient connected to nasal cannula oxygen  Post-op Assessment: Report given to PACU RN and Post -op Vital signs reviewed and stable  Post vital signs: Reviewed and stable  Complications: No apparent anesthesia complications

## 2013-07-27 NOTE — H&P (Signed)
  Procedure: Repeat screening colonoscopy  History: The patient is a 79 year old male born Jan 29, 1934. He underwent a normal screening colonoscopy on 12/14/2003.   The patient is scheduled to undergo a repeat screening colonoscopy today.  Past medical history: Hypertension. Obstructive sleep apnea. Benign prostatic hypertrophy. Degenerative joint disease. Stage III chronic kidney disease. Allergic rhinitis. Chronic back pain. Hyperlipidemia. Glaucoma. Universal colonic diverticulosis. Vasectomy. TURP. Perirectal abscess drainage. Right inguinal herniorrhaphy  Medication allergies: Sulfa. Lisinopril.  Exam: The patient is alert and lying comfortably on the endoscopy stretcher. Cardiac exam reveals a regular rhythm. Abdomen is soft and nontender to palpation. Lungs are clear to auscultation.  Plan: Proceed with repeat screening colonoscopy.

## 2013-07-27 NOTE — Anesthesia Postprocedure Evaluation (Signed)
  Anesthesia Post-op Note  Patient: Marvin West  Procedure(s) Performed: Procedure(s) (LRB): COLONOSCOPY WITH PROPOFOL (N/A)  Patient Location: PACU  Anesthesia Type: MAC  Level of Consciousness: awake and alert   Airway and Oxygen Therapy: Patient Spontanous Breathing  Post-op Pain: mild  Post-op Assessment: Post-op Vital signs reviewed, Patient's Cardiovascular Status Stable, Respiratory Function Stable, Patent Airway and No signs of Nausea or vomiting  Last Vitals:  Filed Vitals:   07/27/13 0950  BP: 161/68  Temp:   Resp: 17    Post-op Vital Signs: stable   Complications: No apparent anesthesia complications

## 2013-07-27 NOTE — Anesthesia Preprocedure Evaluation (Signed)
Anesthesia Evaluation  Patient identified by MRN, date of birth, ID band Patient awake    Reviewed: Allergy & Precautions, H&P , NPO status , Patient's Chart, lab work & pertinent test results  Airway Mallampati: II TM Distance: >3 FB Neck ROM: Full    Dental no notable dental hx.    Pulmonary sleep apnea ,  breath sounds clear to auscultation  Pulmonary exam normal       Cardiovascular Exercise Tolerance: Good hypertension, Pt. on medications and Pt. on home beta blockers negative cardio ROS  + dysrhythmias Rhythm:Regular Rate:Normal     Neuro/Psych negative neurological ROS  negative psych ROS   GI/Hepatic Neg liver ROS, GERD-  Medicated,  Endo/Other  negative endocrine ROS  Renal/GU negative Renal ROS  negative genitourinary   Musculoskeletal negative musculoskeletal ROS (+)   Abdominal   Peds negative pediatric ROS (+)  Hematology negative hematology ROS (+)   Anesthesia Other Findings   Reproductive/Obstetrics negative OB ROS                           Anesthesia Physical Anesthesia Plan  ASA: III  Anesthesia Plan: MAC   Post-op Pain Management:    Induction: Intravenous  Airway Management Planned:   Additional Equipment:   Intra-op Plan:   Post-operative Plan:   Informed Consent: I have reviewed the patients History and Physical, chart, labs and discussed the procedure including the risks, benefits and alternatives for the proposed anesthesia with the patient or authorized representative who has indicated his/her understanding and acceptance.   Dental advisory given  Plan Discussed with: CRNA  Anesthesia Plan Comments:         Anesthesia Quick Evaluation

## 2013-07-27 NOTE — Op Note (Signed)
Procedure: Screening colonoscopy  Endoscopist: Danise Edge  Premedication: Propofol administered by anesthesia  Procedure: The patient was placed in the left lateral decubitus position. Anal inspection was normal. The Pentax pediatric colonoscope was introduced into the rectum and advanced to the cecum. A normal-appearing ileocecal valve and appendiceal orifice were identified. Colonic preparation for the exam today was good.  Universal colonic diverticulosis was present.  Rectum. Normal. Retroflexed view of the distal rectum normal.  Sigmoid colon and descending colon. Normal.  Splenic flexure. Normal.  Transverse colon. Normal.  Hepatic flexure. Normal.  Ascending colon. Normal.  Cecum and ileocecal valve. Normal.  Assessment: Normal screening proctocolonoscopy to the cecum.

## 2013-07-28 ENCOUNTER — Encounter (HOSPITAL_COMMUNITY): Payer: Self-pay | Admitting: Gastroenterology

## 2013-10-07 ENCOUNTER — Telehealth: Payer: Self-pay | Admitting: *Deleted

## 2013-10-07 MED ORDER — COLCHICINE 0.6 MG PO TABS: 0.6000 mg | ORAL_TABLET | Freq: Every day | ORAL | Status: DC | PRN

## 2013-10-07 NOTE — Telephone Encounter (Signed)
Refill request Colcrys 0.6mg  electronically prescribed.

## 2013-11-03 ENCOUNTER — Other Ambulatory Visit: Payer: Self-pay | Admitting: Dermatology

## 2014-10-31 ENCOUNTER — Telehealth: Payer: Self-pay | Admitting: *Deleted

## 2014-10-31 NOTE — Telephone Encounter (Signed)
Pt called and stated he is living in [place] right now and is wanting to know if you would write him a rx for Naot shoes. Pt [id number]

## 2014-11-04 NOTE — Telephone Encounter (Signed)
no

## 2014-11-07 ENCOUNTER — Telehealth: Payer: Self-pay | Admitting: *Deleted

## 2014-11-07 NOTE — Telephone Encounter (Signed)
Called and spoke with pt letting him know per dr regal : no, he would not write for a rx for naot shoes. Pt understood.

## 2015-12-28 DIAGNOSIS — L74510 Primary focal hyperhidrosis, axilla: Secondary | ICD-10-CM | POA: Diagnosis not present

## 2015-12-28 DIAGNOSIS — L738 Other specified follicular disorders: Secondary | ICD-10-CM | POA: Diagnosis not present

## 2015-12-28 DIAGNOSIS — L750 Bromhidrosis: Secondary | ICD-10-CM | POA: Diagnosis not present

## 2016-01-01 DIAGNOSIS — E291 Testicular hypofunction: Secondary | ICD-10-CM | POA: Diagnosis not present

## 2016-02-06 DIAGNOSIS — E291 Testicular hypofunction: Secondary | ICD-10-CM | POA: Diagnosis not present

## 2016-02-06 DIAGNOSIS — E7800 Pure hypercholesterolemia, unspecified: Secondary | ICD-10-CM | POA: Diagnosis not present

## 2016-02-06 DIAGNOSIS — M109 Gout, unspecified: Secondary | ICD-10-CM | POA: Diagnosis not present

## 2016-02-06 DIAGNOSIS — N183 Chronic kidney disease, stage 3 (moderate): Secondary | ICD-10-CM | POA: Diagnosis not present

## 2016-02-06 DIAGNOSIS — I10 Essential (primary) hypertension: Secondary | ICD-10-CM | POA: Diagnosis not present

## 2016-03-05 DIAGNOSIS — E291 Testicular hypofunction: Secondary | ICD-10-CM | POA: Diagnosis not present

## 2016-05-24 DIAGNOSIS — G4730 Sleep apnea, unspecified: Secondary | ICD-10-CM | POA: Diagnosis not present

## 2016-05-24 DIAGNOSIS — I10 Essential (primary) hypertension: Secondary | ICD-10-CM | POA: Diagnosis not present

## 2016-05-24 DIAGNOSIS — E291 Testicular hypofunction: Secondary | ICD-10-CM | POA: Diagnosis not present

## 2016-05-24 DIAGNOSIS — Z0001 Encounter for general adult medical examination with abnormal findings: Secondary | ICD-10-CM | POA: Diagnosis not present

## 2016-05-29 DIAGNOSIS — Z0001 Encounter for general adult medical examination with abnormal findings: Secondary | ICD-10-CM | POA: Diagnosis not present

## 2016-05-29 DIAGNOSIS — E291 Testicular hypofunction: Secondary | ICD-10-CM | POA: Diagnosis not present

## 2016-05-29 DIAGNOSIS — R51 Headache: Secondary | ICD-10-CM | POA: Diagnosis not present

## 2016-05-29 DIAGNOSIS — G4762 Sleep related leg cramps: Secondary | ICD-10-CM | POA: Diagnosis not present

## 2016-05-29 DIAGNOSIS — R7303 Prediabetes: Secondary | ICD-10-CM | POA: Diagnosis not present

## 2016-05-29 DIAGNOSIS — Z1329 Encounter for screening for other suspected endocrine disorder: Secondary | ICD-10-CM | POA: Diagnosis not present

## 2016-05-29 DIAGNOSIS — E782 Mixed hyperlipidemia: Secondary | ICD-10-CM | POA: Diagnosis not present

## 2016-05-29 DIAGNOSIS — M2550 Pain in unspecified joint: Secondary | ICD-10-CM | POA: Diagnosis not present

## 2016-05-29 DIAGNOSIS — E559 Vitamin D deficiency, unspecified: Secondary | ICD-10-CM | POA: Diagnosis not present

## 2016-05-29 DIAGNOSIS — E538 Deficiency of other specified B group vitamins: Secondary | ICD-10-CM | POA: Diagnosis not present

## 2016-05-29 DIAGNOSIS — R944 Abnormal results of kidney function studies: Secondary | ICD-10-CM | POA: Diagnosis not present

## 2016-05-29 DIAGNOSIS — R972 Elevated prostate specific antigen [PSA]: Secondary | ICD-10-CM | POA: Diagnosis not present

## 2016-05-29 DIAGNOSIS — N181 Chronic kidney disease, stage 1: Secondary | ICD-10-CM | POA: Diagnosis not present

## 2016-06-10 DIAGNOSIS — R972 Elevated prostate specific antigen [PSA]: Secondary | ICD-10-CM | POA: Diagnosis not present

## 2016-06-10 DIAGNOSIS — N181 Chronic kidney disease, stage 1: Secondary | ICD-10-CM | POA: Diagnosis not present

## 2016-06-10 DIAGNOSIS — R7303 Prediabetes: Secondary | ICD-10-CM | POA: Diagnosis not present

## 2016-06-10 DIAGNOSIS — E782 Mixed hyperlipidemia: Secondary | ICD-10-CM | POA: Diagnosis not present

## 2016-06-10 DIAGNOSIS — E291 Testicular hypofunction: Secondary | ICD-10-CM | POA: Diagnosis not present

## 2016-06-10 DIAGNOSIS — R944 Abnormal results of kidney function studies: Secondary | ICD-10-CM | POA: Diagnosis not present

## 2016-06-10 DIAGNOSIS — I10 Essential (primary) hypertension: Secondary | ICD-10-CM | POA: Diagnosis not present

## 2016-06-13 DIAGNOSIS — M62830 Muscle spasm of back: Secondary | ICD-10-CM | POA: Diagnosis not present

## 2016-06-13 DIAGNOSIS — R531 Weakness: Secondary | ICD-10-CM | POA: Diagnosis not present

## 2016-06-13 DIAGNOSIS — M545 Low back pain: Secondary | ICD-10-CM | POA: Diagnosis not present

## 2016-06-18 DIAGNOSIS — M545 Low back pain: Secondary | ICD-10-CM | POA: Diagnosis not present

## 2016-06-18 DIAGNOSIS — M62830 Muscle spasm of back: Secondary | ICD-10-CM | POA: Diagnosis not present

## 2016-06-18 DIAGNOSIS — R531 Weakness: Secondary | ICD-10-CM | POA: Diagnosis not present

## 2016-06-20 DIAGNOSIS — M62830 Muscle spasm of back: Secondary | ICD-10-CM | POA: Diagnosis not present

## 2016-06-20 DIAGNOSIS — R531 Weakness: Secondary | ICD-10-CM | POA: Diagnosis not present

## 2016-06-20 DIAGNOSIS — M545 Low back pain: Secondary | ICD-10-CM | POA: Diagnosis not present

## 2016-06-27 DIAGNOSIS — E291 Testicular hypofunction: Secondary | ICD-10-CM | POA: Diagnosis not present

## 2016-06-27 DIAGNOSIS — B351 Tinea unguium: Secondary | ICD-10-CM | POA: Diagnosis not present

## 2016-06-27 DIAGNOSIS — M545 Low back pain: Secondary | ICD-10-CM | POA: Diagnosis not present

## 2016-06-27 DIAGNOSIS — R531 Weakness: Secondary | ICD-10-CM | POA: Diagnosis not present

## 2016-06-27 DIAGNOSIS — M62830 Muscle spasm of back: Secondary | ICD-10-CM | POA: Diagnosis not present

## 2016-06-27 DIAGNOSIS — B356 Tinea cruris: Secondary | ICD-10-CM | POA: Diagnosis not present

## 2016-07-02 DIAGNOSIS — M62830 Muscle spasm of back: Secondary | ICD-10-CM | POA: Diagnosis not present

## 2016-07-02 DIAGNOSIS — R531 Weakness: Secondary | ICD-10-CM | POA: Diagnosis not present

## 2016-07-02 DIAGNOSIS — M545 Low back pain: Secondary | ICD-10-CM | POA: Diagnosis not present

## 2016-07-09 DIAGNOSIS — R531 Weakness: Secondary | ICD-10-CM | POA: Diagnosis not present

## 2016-07-09 DIAGNOSIS — B356 Tinea cruris: Secondary | ICD-10-CM | POA: Diagnosis not present

## 2016-07-09 DIAGNOSIS — D485 Neoplasm of uncertain behavior of skin: Secondary | ICD-10-CM | POA: Diagnosis not present

## 2016-07-09 DIAGNOSIS — L821 Other seborrheic keratosis: Secondary | ICD-10-CM | POA: Diagnosis not present

## 2016-07-09 DIAGNOSIS — M62830 Muscle spasm of back: Secondary | ICD-10-CM | POA: Diagnosis not present

## 2016-07-09 DIAGNOSIS — M545 Low back pain: Secondary | ICD-10-CM | POA: Diagnosis not present

## 2016-07-11 DIAGNOSIS — R531 Weakness: Secondary | ICD-10-CM | POA: Diagnosis not present

## 2016-07-11 DIAGNOSIS — M545 Low back pain: Secondary | ICD-10-CM | POA: Diagnosis not present

## 2016-07-11 DIAGNOSIS — M62830 Muscle spasm of back: Secondary | ICD-10-CM | POA: Diagnosis not present

## 2016-07-16 DIAGNOSIS — R531 Weakness: Secondary | ICD-10-CM | POA: Diagnosis not present

## 2016-07-16 DIAGNOSIS — M62830 Muscle spasm of back: Secondary | ICD-10-CM | POA: Diagnosis not present

## 2016-07-16 DIAGNOSIS — M545 Low back pain: Secondary | ICD-10-CM | POA: Diagnosis not present

## 2016-07-18 DIAGNOSIS — M62830 Muscle spasm of back: Secondary | ICD-10-CM | POA: Diagnosis not present

## 2016-07-18 DIAGNOSIS — R531 Weakness: Secondary | ICD-10-CM | POA: Diagnosis not present

## 2016-07-18 DIAGNOSIS — M545 Low back pain: Secondary | ICD-10-CM | POA: Diagnosis not present

## 2016-07-25 DIAGNOSIS — M545 Low back pain: Secondary | ICD-10-CM | POA: Diagnosis not present

## 2016-07-25 DIAGNOSIS — M62830 Muscle spasm of back: Secondary | ICD-10-CM | POA: Diagnosis not present

## 2016-07-25 DIAGNOSIS — R531 Weakness: Secondary | ICD-10-CM | POA: Diagnosis not present

## 2016-07-30 DIAGNOSIS — M62830 Muscle spasm of back: Secondary | ICD-10-CM | POA: Diagnosis not present

## 2016-07-30 DIAGNOSIS — R531 Weakness: Secondary | ICD-10-CM | POA: Diagnosis not present

## 2016-07-30 DIAGNOSIS — M545 Low back pain: Secondary | ICD-10-CM | POA: Diagnosis not present

## 2016-08-01 DIAGNOSIS — M62830 Muscle spasm of back: Secondary | ICD-10-CM | POA: Diagnosis not present

## 2016-08-01 DIAGNOSIS — M19042 Primary osteoarthritis, left hand: Secondary | ICD-10-CM | POA: Diagnosis not present

## 2016-08-01 DIAGNOSIS — Z202 Contact with and (suspected) exposure to infections with a predominantly sexual mode of transmission: Secondary | ICD-10-CM | POA: Diagnosis not present

## 2016-08-01 DIAGNOSIS — R531 Weakness: Secondary | ICD-10-CM | POA: Diagnosis not present

## 2016-08-01 DIAGNOSIS — E291 Testicular hypofunction: Secondary | ICD-10-CM | POA: Diagnosis not present

## 2016-08-01 DIAGNOSIS — M19041 Primary osteoarthritis, right hand: Secondary | ICD-10-CM | POA: Diagnosis not present

## 2016-08-01 DIAGNOSIS — R5383 Other fatigue: Secondary | ICD-10-CM | POA: Diagnosis not present

## 2016-08-01 DIAGNOSIS — M545 Low back pain: Secondary | ICD-10-CM | POA: Diagnosis not present

## 2016-08-01 DIAGNOSIS — R51 Headache: Secondary | ICD-10-CM | POA: Diagnosis not present

## 2016-08-01 DIAGNOSIS — Z7251 High risk heterosexual behavior: Secondary | ICD-10-CM | POA: Diagnosis not present

## 2016-08-06 DIAGNOSIS — M62830 Muscle spasm of back: Secondary | ICD-10-CM | POA: Diagnosis not present

## 2016-08-06 DIAGNOSIS — R531 Weakness: Secondary | ICD-10-CM | POA: Diagnosis not present

## 2016-08-06 DIAGNOSIS — M545 Low back pain: Secondary | ICD-10-CM | POA: Diagnosis not present

## 2016-08-07 DIAGNOSIS — Z961 Presence of intraocular lens: Secondary | ICD-10-CM | POA: Diagnosis not present

## 2016-08-07 DIAGNOSIS — H16223 Keratoconjunctivitis sicca, not specified as Sjogren's, bilateral: Secondary | ICD-10-CM | POA: Diagnosis not present

## 2016-08-13 DIAGNOSIS — M62830 Muscle spasm of back: Secondary | ICD-10-CM | POA: Diagnosis not present

## 2016-08-13 DIAGNOSIS — R531 Weakness: Secondary | ICD-10-CM | POA: Diagnosis not present

## 2016-08-13 DIAGNOSIS — M545 Low back pain: Secondary | ICD-10-CM | POA: Diagnosis not present

## 2016-08-15 DIAGNOSIS — I10 Essential (primary) hypertension: Secondary | ICD-10-CM | POA: Diagnosis not present

## 2016-08-15 DIAGNOSIS — E782 Mixed hyperlipidemia: Secondary | ICD-10-CM | POA: Diagnosis not present

## 2016-09-03 DIAGNOSIS — M62830 Muscle spasm of back: Secondary | ICD-10-CM | POA: Diagnosis not present

## 2016-09-03 DIAGNOSIS — R531 Weakness: Secondary | ICD-10-CM | POA: Diagnosis not present

## 2016-09-03 DIAGNOSIS — M545 Low back pain: Secondary | ICD-10-CM | POA: Diagnosis not present

## 2016-09-04 DIAGNOSIS — T22219A Burn of second degree of unspecified forearm, initial encounter: Secondary | ICD-10-CM | POA: Diagnosis not present

## 2016-09-04 DIAGNOSIS — N3281 Overactive bladder: Secondary | ICD-10-CM | POA: Diagnosis not present

## 2016-09-04 DIAGNOSIS — L259 Unspecified contact dermatitis, unspecified cause: Secondary | ICD-10-CM | POA: Diagnosis not present

## 2016-09-05 DIAGNOSIS — M62830 Muscle spasm of back: Secondary | ICD-10-CM | POA: Diagnosis not present

## 2016-09-05 DIAGNOSIS — M545 Low back pain: Secondary | ICD-10-CM | POA: Diagnosis not present

## 2016-09-05 DIAGNOSIS — R531 Weakness: Secondary | ICD-10-CM | POA: Diagnosis not present

## 2016-09-10 DIAGNOSIS — M545 Low back pain: Secondary | ICD-10-CM | POA: Diagnosis not present

## 2016-09-10 DIAGNOSIS — M62830 Muscle spasm of back: Secondary | ICD-10-CM | POA: Diagnosis not present

## 2016-09-10 DIAGNOSIS — R531 Weakness: Secondary | ICD-10-CM | POA: Diagnosis not present

## 2016-09-17 DIAGNOSIS — R531 Weakness: Secondary | ICD-10-CM | POA: Diagnosis not present

## 2016-09-17 DIAGNOSIS — M62830 Muscle spasm of back: Secondary | ICD-10-CM | POA: Diagnosis not present

## 2016-09-17 DIAGNOSIS — M545 Low back pain: Secondary | ICD-10-CM | POA: Diagnosis not present

## 2016-10-22 DIAGNOSIS — Z23 Encounter for immunization: Secondary | ICD-10-CM | POA: Diagnosis not present

## 2016-10-31 DIAGNOSIS — M545 Low back pain: Secondary | ICD-10-CM | POA: Diagnosis not present

## 2016-10-31 DIAGNOSIS — Z5181 Encounter for therapeutic drug level monitoring: Secondary | ICD-10-CM | POA: Diagnosis not present

## 2016-10-31 DIAGNOSIS — E291 Testicular hypofunction: Secondary | ICD-10-CM | POA: Diagnosis not present

## 2017-01-21 DIAGNOSIS — M545 Low back pain: Secondary | ICD-10-CM | POA: Diagnosis not present

## 2017-01-21 DIAGNOSIS — R21 Rash and other nonspecific skin eruption: Secondary | ICD-10-CM | POA: Diagnosis not present

## 2017-01-21 DIAGNOSIS — E291 Testicular hypofunction: Secondary | ICD-10-CM | POA: Diagnosis not present

## 2017-01-21 DIAGNOSIS — K921 Melena: Secondary | ICD-10-CM | POA: Diagnosis not present

## 2017-02-03 DIAGNOSIS — M545 Low back pain: Secondary | ICD-10-CM | POA: Diagnosis not present

## 2017-02-05 DIAGNOSIS — L299 Pruritus, unspecified: Secondary | ICD-10-CM | POA: Diagnosis not present

## 2017-02-05 DIAGNOSIS — L235 Allergic contact dermatitis due to other chemical products: Secondary | ICD-10-CM | POA: Diagnosis not present

## 2017-02-12 DIAGNOSIS — I10 Essential (primary) hypertension: Secondary | ICD-10-CM | POA: Diagnosis not present

## 2017-02-12 DIAGNOSIS — M545 Low back pain: Secondary | ICD-10-CM | POA: Diagnosis not present

## 2017-02-12 DIAGNOSIS — K921 Melena: Secondary | ICD-10-CM | POA: Diagnosis not present

## 2017-02-12 DIAGNOSIS — R0602 Shortness of breath: Secondary | ICD-10-CM | POA: Diagnosis not present

## 2017-02-18 DIAGNOSIS — K625 Hemorrhage of anus and rectum: Secondary | ICD-10-CM | POA: Diagnosis not present

## 2017-02-18 DIAGNOSIS — R194 Change in bowel habit: Secondary | ICD-10-CM | POA: Diagnosis not present

## 2017-02-18 DIAGNOSIS — K6289 Other specified diseases of anus and rectum: Secondary | ICD-10-CM | POA: Diagnosis not present

## 2017-02-18 DIAGNOSIS — K921 Melena: Secondary | ICD-10-CM | POA: Diagnosis not present

## 2017-02-18 DIAGNOSIS — R12 Heartburn: Secondary | ICD-10-CM | POA: Diagnosis not present

## 2017-02-19 DIAGNOSIS — M5126 Other intervertebral disc displacement, lumbar region: Secondary | ICD-10-CM | POA: Diagnosis not present

## 2017-02-19 DIAGNOSIS — M5416 Radiculopathy, lumbar region: Secondary | ICD-10-CM | POA: Diagnosis not present

## 2017-02-19 DIAGNOSIS — M961 Postlaminectomy syndrome, not elsewhere classified: Secondary | ICD-10-CM | POA: Diagnosis not present

## 2017-02-19 DIAGNOSIS — M47896 Other spondylosis, lumbar region: Secondary | ICD-10-CM | POA: Diagnosis not present

## 2017-02-24 DIAGNOSIS — M5416 Radiculopathy, lumbar region: Secondary | ICD-10-CM | POA: Diagnosis not present

## 2017-02-26 DIAGNOSIS — M1990 Unspecified osteoarthritis, unspecified site: Secondary | ICD-10-CM | POA: Diagnosis not present

## 2017-02-26 DIAGNOSIS — G4730 Sleep apnea, unspecified: Secondary | ICD-10-CM | POA: Diagnosis not present

## 2017-02-26 DIAGNOSIS — M47896 Other spondylosis, lumbar region: Secondary | ICD-10-CM | POA: Diagnosis not present

## 2017-02-26 DIAGNOSIS — I10 Essential (primary) hypertension: Secondary | ICD-10-CM | POA: Diagnosis not present

## 2017-02-26 DIAGNOSIS — M47816 Spondylosis without myelopathy or radiculopathy, lumbar region: Secondary | ICD-10-CM | POA: Diagnosis not present

## 2017-02-27 DIAGNOSIS — M47896 Other spondylosis, lumbar region: Secondary | ICD-10-CM | POA: Diagnosis not present

## 2017-02-27 DIAGNOSIS — M545 Low back pain: Secondary | ICD-10-CM | POA: Diagnosis not present

## 2017-02-27 DIAGNOSIS — M961 Postlaminectomy syndrome, not elsewhere classified: Secondary | ICD-10-CM | POA: Diagnosis not present

## 2017-02-27 DIAGNOSIS — M5126 Other intervertebral disc displacement, lumbar region: Secondary | ICD-10-CM | POA: Diagnosis not present

## 2017-03-11 DIAGNOSIS — L249 Irritant contact dermatitis, unspecified cause: Secondary | ICD-10-CM | POA: Diagnosis not present

## 2017-03-28 DIAGNOSIS — R5383 Other fatigue: Secondary | ICD-10-CM | POA: Diagnosis not present

## 2017-03-28 DIAGNOSIS — E291 Testicular hypofunction: Secondary | ICD-10-CM | POA: Diagnosis not present

## 2017-03-28 DIAGNOSIS — R51 Headache: Secondary | ICD-10-CM | POA: Diagnosis not present

## 2017-03-28 DIAGNOSIS — M2550 Pain in unspecified joint: Secondary | ICD-10-CM | POA: Diagnosis not present

## 2017-03-31 DIAGNOSIS — I10 Essential (primary) hypertension: Secondary | ICD-10-CM | POA: Diagnosis not present

## 2017-03-31 DIAGNOSIS — K921 Melena: Secondary | ICD-10-CM | POA: Diagnosis not present

## 2017-03-31 DIAGNOSIS — M545 Low back pain: Secondary | ICD-10-CM | POA: Diagnosis not present

## 2017-03-31 DIAGNOSIS — R55 Syncope and collapse: Secondary | ICD-10-CM | POA: Diagnosis not present

## 2017-04-01 DIAGNOSIS — L249 Irritant contact dermatitis, unspecified cause: Secondary | ICD-10-CM | POA: Diagnosis not present

## 2017-04-04 DIAGNOSIS — R55 Syncope and collapse: Secondary | ICD-10-CM | POA: Diagnosis not present

## 2017-04-04 DIAGNOSIS — R002 Palpitations: Secondary | ICD-10-CM | POA: Diagnosis not present

## 2017-05-01 DIAGNOSIS — E7800 Pure hypercholesterolemia, unspecified: Secondary | ICD-10-CM | POA: Diagnosis not present

## 2017-05-01 DIAGNOSIS — R7989 Other specified abnormal findings of blood chemistry: Secondary | ICD-10-CM | POA: Diagnosis not present

## 2017-05-01 DIAGNOSIS — I10 Essential (primary) hypertension: Secondary | ICD-10-CM | POA: Diagnosis not present

## 2017-05-01 DIAGNOSIS — Z125 Encounter for screening for malignant neoplasm of prostate: Secondary | ICD-10-CM | POA: Diagnosis not present

## 2017-05-01 DIAGNOSIS — R634 Abnormal weight loss: Secondary | ICD-10-CM | POA: Diagnosis not present

## 2017-05-08 DIAGNOSIS — K921 Melena: Secondary | ICD-10-CM | POA: Diagnosis not present

## 2017-05-12 DIAGNOSIS — H40013 Open angle with borderline findings, low risk, bilateral: Secondary | ICD-10-CM | POA: Diagnosis not present

## 2017-05-12 DIAGNOSIS — H00011 Hordeolum externum right upper eyelid: Secondary | ICD-10-CM | POA: Diagnosis not present

## 2017-05-15 DIAGNOSIS — I10 Essential (primary) hypertension: Secondary | ICD-10-CM | POA: Diagnosis not present

## 2017-05-15 DIAGNOSIS — R7989 Other specified abnormal findings of blood chemistry: Secondary | ICD-10-CM | POA: Diagnosis not present

## 2017-05-15 DIAGNOSIS — R634 Abnormal weight loss: Secondary | ICD-10-CM | POA: Diagnosis not present

## 2017-06-04 DIAGNOSIS — R7989 Other specified abnormal findings of blood chemistry: Secondary | ICD-10-CM | POA: Diagnosis not present

## 2017-06-04 DIAGNOSIS — Z0000 Encounter for general adult medical examination without abnormal findings: Secondary | ICD-10-CM | POA: Diagnosis not present

## 2017-06-04 DIAGNOSIS — Z23 Encounter for immunization: Secondary | ICD-10-CM | POA: Diagnosis not present

## 2017-06-17 DIAGNOSIS — R7989 Other specified abnormal findings of blood chemistry: Secondary | ICD-10-CM | POA: Diagnosis not present

## 2017-06-17 DIAGNOSIS — N529 Male erectile dysfunction, unspecified: Secondary | ICD-10-CM | POA: Diagnosis not present

## 2017-07-04 DIAGNOSIS — N401 Enlarged prostate with lower urinary tract symptoms: Secondary | ICD-10-CM | POA: Diagnosis not present

## 2017-07-04 DIAGNOSIS — N529 Male erectile dysfunction, unspecified: Secondary | ICD-10-CM | POA: Diagnosis not present

## 2017-07-04 DIAGNOSIS — R7989 Other specified abnormal findings of blood chemistry: Secondary | ICD-10-CM | POA: Diagnosis not present

## 2017-08-01 DIAGNOSIS — N400 Enlarged prostate without lower urinary tract symptoms: Secondary | ICD-10-CM | POA: Diagnosis not present

## 2017-08-01 DIAGNOSIS — E291 Testicular hypofunction: Secondary | ICD-10-CM | POA: Diagnosis not present

## 2017-08-01 DIAGNOSIS — N183 Chronic kidney disease, stage 3 (moderate): Secondary | ICD-10-CM | POA: Diagnosis not present

## 2017-08-01 DIAGNOSIS — I129 Hypertensive chronic kidney disease with stage 1 through stage 4 chronic kidney disease, or unspecified chronic kidney disease: Secondary | ICD-10-CM | POA: Diagnosis not present

## 2017-08-13 DIAGNOSIS — K921 Melena: Secondary | ICD-10-CM | POA: Diagnosis not present

## 2017-08-15 DIAGNOSIS — E291 Testicular hypofunction: Secondary | ICD-10-CM | POA: Diagnosis not present

## 2017-09-15 DIAGNOSIS — L74510 Primary focal hyperhidrosis, axilla: Secondary | ICD-10-CM | POA: Diagnosis not present

## 2017-09-15 DIAGNOSIS — S161XXA Strain of muscle, fascia and tendon at neck level, initial encounter: Secondary | ICD-10-CM | POA: Diagnosis not present

## 2017-09-16 ENCOUNTER — Other Ambulatory Visit: Payer: Self-pay | Admitting: General Surgery

## 2017-09-16 DIAGNOSIS — K642 Third degree hemorrhoids: Secondary | ICD-10-CM | POA: Diagnosis not present

## 2017-09-16 NOTE — H&P (Signed)
History of Present Illness Leighton Ruff MD; 1/82/9937 9:16 AM) The patient is a 83 year old male who presents with hemorrhoids. 83 year old male with previous hemorrhoid procedure in 2004 for who presents to the office for evaluation of rectal bleeding. He is had 2 colonoscopies in the past 5 years which showed no signs of malignancy. He reports intermittent bleeding especially with irregular bowel habits. He notes blood in his stool as well as bright red bleeding on his toilet paper.   Past Surgical History Malachy Moan, [place]; 09/16/2017 8:52 AM) Breast Mass; Local Excision Bilateral. Colon Polyp Removal - Open Foot Surgery Bilateral. Oral Surgery Spinal Surgery - Lower Back TURP  Diagnostic Studies History Malachy Moan, RMA; 09/16/2017 8:52 AM) Colonoscopy 1-5 years ago  Allergies Malachy Moan, RMA; 09/16/2017 8:53 AM) No Known Drug Allergies 09/16/2017  Medication History Malachy Moan, RMA; 09/16/2017 8:55 AM) Allopurinol (300MG  Tablet, Oral) Active. Amlodipine-Atorvastatin (5-40MG  Tablet, Oral) Active. Clindamycin Phosphate (1% Gel, External) Active. HydroCHLOROthiazide (25MG  Tablet, Oral) Active. Irbesartan (300MG  Tablet, Oral) Active. Pennsaid (2% Solution, Transdermal) Active. Metoprolol Succinate ER (100MG  Tablet ER 24HR, Oral) Active. Tamsulosin HCl (0.4MG  Capsule, Oral) Active. Cialis (20MG  Tablet, Oral) Active. Medications Reconciled  Social History Malachy Moan, [place]; 09/16/2017 8:52 AM) Alcohol use Occasional alcohol use. Caffeine use Coffee, Tea. Illicit drug use Prefer to discuss with provider. Tobacco use Current some day smoker.  Family History Malachy Moan, [place]; 09/16/2017 8:52 AM) First Degree Relatives No pertinent family history  Other Problems Malachy Moan, RMA; 09/16/2017 8:52 AM) Arthritis Back Pain Gastroesophageal Reflux Disease High blood pressure     Review of Systems Malachy Moan RMA; 09/16/2017 8:52 AM) General Not Present- Appetite Loss, Chills, Fatigue, Fever, Night Sweats, Weight Gain and Weight Loss. Skin Not Present- Change in Wart/Mole, Dryness, Hives, Jaundice, New Lesions, Non-Healing Wounds, Rash and Ulcer. HEENT Present- Wears glasses/contact lenses. Not Present- Earache, Hearing Loss, Hoarseness, Nose Bleed, Oral Ulcers, Ringing in the Ears, Seasonal Allergies, Sinus Pain, Sore Throat, Visual Disturbances and Yellow Eyes. Breast Not Present- Breast Mass, Breast Pain, Nipple Discharge and Skin Changes. Cardiovascular Not Present- Chest Pain, Difficulty Breathing Lying Down, Leg Cramps, Palpitations, Rapid Heart Rate, Shortness of Breath and Swelling of Extremities. Gastrointestinal Present- Bloody Stool and Hemorrhoids. Not Present- Abdominal Pain, Bloating, Change in Bowel Habits, Chronic diarrhea, Constipation, Difficulty Swallowing, Excessive gas, Gets full quickly at meals, Indigestion, Nausea, Rectal Pain and Vomiting. Male Genitourinary Present- Frequency and Nocturia. Not Present- Blood in Urine, Change in Urinary Stream, Impotence, Painful Urination, Urgency and Urine Leakage. Musculoskeletal Present- Back Pain and Joint Stiffness. Not Present- Joint Pain, Muscle Pain, Muscle Weakness and Swelling of Extremities. Neurological Not Present- Decreased Memory, Fainting, Headaches, Numbness, Seizures, Tingling, Tremor, Trouble walking and Weakness. Psychiatric Present- Frequent crying. Not Present- Anxiety, Bipolar, Change in Sleep Pattern, Depression and Fearful. Endocrine Not Present- Cold Intolerance, Excessive Hunger, Hair Changes, Heat Intolerance, Hot flashes and New Diabetes. Hematology Not Present- Blood Thinners, Easy Bruising, Excessive bleeding, Gland problems, HIV and Persistent Infections.  Vitals Malachy Moan RMA; 09/16/2017 8:55 AM) 09/16/2017 8:55 AM Weight: 172 lb Height: 69.5in Body Surface Area: 1.95 m Body Mass Index: 25.04  kg/m  Temp.: 98.58F  Pulse: 54 (Regular)  BP: 150/86 (Sitting, Left Arm, Standard)      Physical Exam Leighton Ruff MD; 1/69/6789 9:17 AM)  General Mental Status-Alert. General Appearance-Not in acute distress. Build & Nutrition-Well nourished. Posture-Normal posture. Gait-Normal.  Head and Neck Head-normocephalic, atraumatic with no lesions or palpable masses. Trachea-midline.  Chest and Lung  Exam Chest and lung exam reveals -on auscultation, normal breath sounds, no adventitious sounds and normal vocal resonance.  Cardiovascular Cardiovascular examination reveals -normal heart sounds, regular rate and rhythm with no murmurs, femoral artery auscultation bilaterally reveals normal pulses, no bruits, no thrills and no digital clubbing, cyanosis, edema, increased warmth or tenderness.  Abdomen Inspection Inspection of the abdomen reveals - No Hernias. Palpation/Percussion Palpation and Percussion of the abdomen reveal - Soft, Non Tender, No Rigidity (guarding), No hepatosplenomegaly and No Palpable abdominal masses.  Rectal Anorectal Exam External - skin tag. Internal - normal sphincter tone.  Neurologic Neurologic evaluation reveals -alert and oriented x 3 with no impairment of recent or remote memory, normal attention span and ability to concentrate, normal sensation and normal coordination.  Musculoskeletal Normal Exam - Bilateral-Upper Extremity Strength Normal and Lower Extremity Strength Normal.   Results Leighton Ruff MD; 0/38/8828 9:18 AM) Procedures  Name Value Date ANOSCOPY, DIAGNOSTIC (00349) [ Hemorrhoids ] Procedure Other: Procedure: Anoscopy Surgeon: Marcello Moores After the risks and benefits were explained, verbal consent was obtained for above procedure. A medical assistant chaperone was present thoroughout the entire procedure. Anesthesia: none Diagnosis: Rectal bleeding Findings: Grade 3 right posterior and left lateral  internal hemorrhoids with significant inflammation  Performed: 09/16/2017 9:17 AM    Assessment & Plan Leighton Ruff MD; 1/79/1505 9:20 AM)  PROLAPSED INTERNAL HEMORRHOIDS, GRADE 3 (W97.9) Impression: 83 year old male who presents to the office with rectal bleeding. On exam he has 2 rather large grade 3 internal hemorrhoids. I have recommended hemorrhoidectomy. I believe he understands that this has a good chance of resolving his symptoms but will wake wire several weeks of recovery and postoperative pain.

## 2017-09-29 ENCOUNTER — Encounter (HOSPITAL_BASED_OUTPATIENT_CLINIC_OR_DEPARTMENT_OTHER): Payer: Self-pay | Admitting: *Deleted

## 2017-09-29 NOTE — Progress Notes (Addendum)
NPO AFTER MN.  ARRIVE AT 0900.  NEEDS ISTAT 8 AND EKG.  PT DOES NOT HAVE HIS MED. LIST WITH HIM RIGHT NOW.  PT ASKED THAT I CALL BACK TOMORROW (TUESDAY 09-30-2017) AT 1200 AND GIVE MED. INSTRUCTIONS.  ADDENDUM:  SPOKE W/ PT TODAY , 09-30-2017 AT 1600, VIA PHONE.  COMPLETED MEDICATION LIST AND UPDATED IN EPIC.  PT VERBALIZED UNDERSTANDING IF HE TAKES METOPROLOL AND CADUET IN THE MORNINGS NORMALLY TAKES ONLY THESE TWO AM DOS W/ SIPS OF WATER. PT WAS NOT AT HOME BUT HAD HIS MED. LIST WITH HIM AND STATED HE TAKES HIS MEDS. WHEN HE TAKES THEM, NO CERTAIN TIME OF DAY.

## 2017-09-30 ENCOUNTER — Ambulatory Visit
Admission: RE | Admit: 2017-09-30 | Discharge: 2017-09-30 | Disposition: A | Discharge status: Home / Self Care | Payer: Medicare Other | Source: Ambulatory Visit | Attending: Internal Medicine | Admitting: Internal Medicine

## 2017-09-30 ENCOUNTER — Other Ambulatory Visit: Payer: Self-pay | Admitting: Internal Medicine

## 2017-09-30 DIAGNOSIS — Z23 Encounter for immunization: Secondary | ICD-10-CM | POA: Diagnosis not present

## 2017-09-30 DIAGNOSIS — M542 Cervicalgia: Secondary | ICD-10-CM

## 2017-09-30 DIAGNOSIS — E291 Testicular hypofunction: Secondary | ICD-10-CM | POA: Diagnosis not present

## 2017-09-30 DIAGNOSIS — M47812 Spondylosis without myelopathy or radiculopathy, cervical region: Secondary | ICD-10-CM | POA: Diagnosis not present

## 2017-10-02 ENCOUNTER — Ambulatory Visit (HOSPITAL_BASED_OUTPATIENT_CLINIC_OR_DEPARTMENT_OTHER): Payer: Medicare Other | Admitting: Anesthesiology

## 2017-10-02 ENCOUNTER — Encounter (HOSPITAL_BASED_OUTPATIENT_CLINIC_OR_DEPARTMENT_OTHER)
Admission: RE | Disposition: A | Discharge status: Home / Self Care | Payer: Self-pay | Source: Ambulatory Visit | Attending: General Surgery

## 2017-10-02 ENCOUNTER — Encounter (HOSPITAL_BASED_OUTPATIENT_CLINIC_OR_DEPARTMENT_OTHER): Payer: Self-pay

## 2017-10-02 ENCOUNTER — Ambulatory Visit (HOSPITAL_BASED_OUTPATIENT_CLINIC_OR_DEPARTMENT_OTHER)
Admission: RE | Admit: 2017-10-02 | Discharge: 2017-10-02 | Disposition: A | Discharge status: Home / Self Care | Payer: Medicare Other | Source: Ambulatory Visit | Attending: General Surgery | Admitting: General Surgery

## 2017-10-02 DIAGNOSIS — K5900 Constipation, unspecified: Secondary | ICD-10-CM | POA: Diagnosis not present

## 2017-10-02 DIAGNOSIS — F17200 Nicotine dependence, unspecified, uncomplicated: Secondary | ICD-10-CM | POA: Insufficient documentation

## 2017-10-02 DIAGNOSIS — K642 Third degree hemorrhoids: Secondary | ICD-10-CM | POA: Insufficient documentation

## 2017-10-02 DIAGNOSIS — K649 Unspecified hemorrhoids: Secondary | ICD-10-CM | POA: Diagnosis not present

## 2017-10-02 DIAGNOSIS — R12 Heartburn: Secondary | ICD-10-CM | POA: Diagnosis not present

## 2017-10-02 DIAGNOSIS — I10 Essential (primary) hypertension: Secondary | ICD-10-CM | POA: Insufficient documentation

## 2017-10-02 DIAGNOSIS — Z79899 Other long term (current) drug therapy: Secondary | ICD-10-CM | POA: Insufficient documentation

## 2017-10-02 HISTORY — DX: Gout, unspecified: M10.9

## 2017-10-02 HISTORY — DX: Chronic kidney disease, stage 3 (moderate): N18.3

## 2017-10-02 HISTORY — DX: Diverticulosis of intestine, part unspecified, without perforation or abscess without bleeding: K57.90

## 2017-10-02 HISTORY — DX: Presence of spectacles and contact lenses: Z97.3

## 2017-10-02 HISTORY — DX: Chronic kidney disease, stage 3 unspecified: N18.30

## 2017-10-02 HISTORY — PX: HEMORRHOID SURGERY: SHX153

## 2017-10-02 HISTORY — DX: Dependence on other enabling machines and devices: Z99.89

## 2017-10-02 HISTORY — DX: Hemorrhage of anus and rectum: K62.5

## 2017-10-02 HISTORY — DX: Benign prostatic hyperplasia with lower urinary tract symptoms: N40.1

## 2017-10-02 HISTORY — DX: Obstructive sleep apnea (adult) (pediatric): G47.33

## 2017-10-02 HISTORY — DX: Unspecified hemorrhoids: K64.9

## 2017-10-02 LAB — POCT I-STAT, CHEM 8
BUN: 24 mg/dL — AB (ref 6–20)
CALCIUM ION: 1.22 mmol/L (ref 1.15–1.40)
CHLORIDE: 103 mmol/L (ref 101–111)
CREATININE: 1.5 mg/dL — AB (ref 0.61–1.24)
GLUCOSE: 101 mg/dL — AB (ref 65–99)
HCT: 42 % (ref 39.0–52.0)
Hemoglobin: 14.3 g/dL (ref 13.0–17.0)
Potassium: 4.3 mmol/L (ref 3.5–5.1)
Sodium: 139 mmol/L (ref 135–145)
TCO2: 25 mmol/L (ref 22–32)

## 2017-10-02 SURGERY — HEMORRHOIDECTOMY
Anesthesia: Monitor Anesthesia Care | Site: Rectum

## 2017-10-02 MED ORDER — TAMSULOSIN HCL 0.4 MG PO CAPS
0.4000 mg | ORAL_CAPSULE | Freq: Once | ORAL | Status: AC
  Administered 2017-10-02: 0.4 mg via ORAL
  Filled 2017-10-02: qty 1

## 2017-10-02 MED ORDER — DIAZEPAM 5 MG PO TABS: 5.0000 mg | ORAL_TABLET | Freq: Three times a day (TID) | ORAL | 0 refills | Status: AC | PRN

## 2017-10-02 MED ORDER — BUPIVACAINE LIPOSOME 1.3 % IJ SUSP
INTRAMUSCULAR | Status: DC | PRN
  Administered 2017-10-02: 20 mL

## 2017-10-02 MED ORDER — OXYCODONE HCL 5 MG PO TABS
5.0000 mg | ORAL_TABLET | Freq: Once | ORAL | Status: DC | PRN
  Filled 2017-10-02: qty 1

## 2017-10-02 MED ORDER — 0.9 % SODIUM CHLORIDE (POUR BTL) OPTIME
TOPICAL | Status: DC | PRN
  Administered 2017-10-02: 500 mL

## 2017-10-02 MED ORDER — PROPOFOL 500 MG/50ML IV EMUL
INTRAVENOUS | Status: DC | PRN
  Administered 2017-10-02: 50 ug/kg/min via INTRAVENOUS

## 2017-10-02 MED ORDER — ONDANSETRON HCL 4 MG/2ML IJ SOLN
4.0000 mg | Freq: Once | INTRAMUSCULAR | Status: DC | PRN
  Filled 2017-10-02: qty 2

## 2017-10-02 MED ORDER — PROPOFOL 500 MG/50ML IV EMUL
INTRAVENOUS | Status: AC
  Filled 2017-10-02: qty 50

## 2017-10-02 MED ORDER — SODIUM CHLORIDE 0.9 % IV SOLN
INTRAVENOUS | Status: DC
  Administered 2017-10-02: 08:00:00 via INTRAVENOUS
  Filled 2017-10-02: qty 1000

## 2017-10-02 MED ORDER — FENTANYL CITRATE (PF) 100 MCG/2ML IJ SOLN
25.0000 ug | INTRAMUSCULAR | Status: DC | PRN
  Filled 2017-10-02: qty 1

## 2017-10-02 MED ORDER — FENTANYL CITRATE (PF) 100 MCG/2ML IJ SOLN
INTRAMUSCULAR | Status: AC
  Filled 2017-10-02: qty 2

## 2017-10-02 MED ORDER — LIDOCAINE 2% (20 MG/ML) 5 ML SYRINGE
INTRAMUSCULAR | Status: DC | PRN
  Administered 2017-10-02: 50 mg via INTRAVENOUS

## 2017-10-02 MED ORDER — OXYCODONE HCL 5 MG/5ML PO SOLN
5.0000 mg | Freq: Once | ORAL | Status: DC | PRN
  Filled 2017-10-02: qty 5

## 2017-10-02 MED ORDER — TAMSULOSIN HCL 0.4 MG PO CAPS: 0.4000 mg | ORAL_CAPSULE | Freq: Every day | ORAL | 0 refills | Status: DC

## 2017-10-02 MED ORDER — PROPOFOL 10 MG/ML IV BOLUS
INTRAVENOUS | Status: DC | PRN
  Administered 2017-10-02: 40 mg via INTRAVENOUS

## 2017-10-02 MED ORDER — LIDOCAINE 2% (20 MG/ML) 5 ML SYRINGE
INTRAMUSCULAR | Status: AC
  Filled 2017-10-02: qty 10

## 2017-10-02 MED ORDER — TAMSULOSIN HCL 0.4 MG PO CAPS
ORAL_CAPSULE | ORAL | Status: AC
  Filled 2017-10-02: qty 1

## 2017-10-02 MED ORDER — BUPIVACAINE-EPINEPHRINE 0.5% -1:200000 IJ SOLN
INTRAMUSCULAR | Status: DC | PRN
Start: 2017-10-02 — End: 2017-10-02
  Administered 2017-10-02: 30 mL

## 2017-10-02 MED ORDER — LACTATED RINGERS IV SOLN
INTRAVENOUS | Status: DC | PRN
  Administered 2017-10-02: 09:00:00 via INTRAVENOUS

## 2017-10-02 MED ORDER — OXYCODONE HCL 5 MG PO TABS: 5.0000 mg | ORAL_TABLET | ORAL | 0 refills | Status: DC | PRN

## 2017-10-02 MED ORDER — LIDOCAINE 5 % EX OINT
TOPICAL_OINTMENT | CUTANEOUS | Status: DC | PRN
  Administered 2017-10-02: 1

## 2017-10-02 SURGICAL SUPPLY — 40 items
BLADE HEX COATED 2.75 (ELECTRODE) ×3 IMPLANT
BLADE SURG 10 STRL SS (BLADE) ×3 IMPLANT
BRIEF STRETCH FOR OB PAD LRG (UNDERPADS AND DIAPERS) IMPLANT
COVER BACK TABLE 60X90IN (DRAPES) ×3 IMPLANT
COVER MAYO STAND STRL (DRAPES) ×3 IMPLANT
DRAPE LAPAROTOMY 100X72 PEDS (DRAPES) ×3 IMPLANT
DRAPE UTILITY XL STRL (DRAPES) ×3 IMPLANT
ELECT BLADE 6.5 .24CM SHAFT (ELECTRODE) IMPLANT
ELECT REM PT RETURN 9FT ADLT (ELECTROSURGICAL) ×3
ELECTRODE REM PT RTRN 9FT ADLT (ELECTROSURGICAL) ×1 IMPLANT
GAUZE SPONGE 4X4 12PLY STRL LF (GAUZE/BANDAGES/DRESSINGS) ×2 IMPLANT
GAUZE SPONGE 4X4 16PLY XRAY LF (GAUZE/BANDAGES/DRESSINGS) IMPLANT
GLOVE BIO SURGEON STRL SZ 6.5 (GLOVE) ×2 IMPLANT
GLOVE BIO SURGEONS STRL SZ 6.5 (GLOVE) ×1
GLOVE INDICATOR 7.0 STRL GRN (GLOVE) ×3 IMPLANT
GOWN SPEC L3 XXLG W/TWL (GOWN DISPOSABLE) ×6 IMPLANT
KIT RM TURNOVER CYSTO AR (KITS) ×3 IMPLANT
NEEDLE HYPO 22GX1.5 SAFETY (NEEDLE) ×3 IMPLANT
NS IRRIG 500ML POUR BTL (IV SOLUTION) ×3 IMPLANT
PACK BASIN DAY SURGERY FS (CUSTOM PROCEDURE TRAY) ×3 IMPLANT
PAD ABD 8X10 STRL (GAUZE/BANDAGES/DRESSINGS) ×3 IMPLANT
PAD ARMBOARD 7.5X6 YLW CONV (MISCELLANEOUS) IMPLANT
PENCIL BUTTON HOLSTER BLD 10FT (ELECTRODE) ×3 IMPLANT
SPONGE GAUZE 4X4 12PLY (GAUZE/BANDAGES/DRESSINGS) ×3 IMPLANT
SPONGE SURGIFOAM ABS GEL 100 (HEMOSTASIS) IMPLANT
SPONGE SURGIFOAM ABS GEL 12-7 (HEMOSTASIS) IMPLANT
SUT CHROMIC 2 0 SH (SUTURE) IMPLANT
SUT CHROMIC 3 0 SH 27 (SUTURE) IMPLANT
SUT PROLENE 2 0 BLUE (SUTURE) IMPLANT
SUT VIC AB 2-0 SH 27 (SUTURE)
SUT VIC AB 2-0 SH 27XBRD (SUTURE) IMPLANT
SUT VIC AB 4-0 P-3 18XBRD (SUTURE) IMPLANT
SUT VIC AB 4-0 P3 18 (SUTURE)
SUT VIC AB 4-0 SH 18 (SUTURE) IMPLANT
SYR CONTROL 10ML LL (SYRINGE) ×3 IMPLANT
TRAY DSU PREP LF (CUSTOM PROCEDURE TRAY) ×3 IMPLANT
TUBE CONNECTING 12'X1/4 (SUCTIONS) ×1
TUBE CONNECTING 12X1/4 (SUCTIONS) ×2 IMPLANT
WATER STERILE IRR 500ML POUR (IV SOLUTION) IMPLANT
YANKAUER SUCT BULB TIP NO VENT (SUCTIONS) ×3 IMPLANT

## 2017-10-02 NOTE — Anesthesia Preprocedure Evaluation (Signed)

## 2017-10-02 NOTE — Transfer of Care (Signed)
Immediate Anesthesia Transfer of Care Note  Patient: Marvin West  Procedure(s) Performed: HEMORRHOIDECTOMY (N/A )  Patient Location: PACU  Anesthesia Type:MAC  Level of Consciousness: awake, alert  and oriented  Airway & Oxygen Therapy: Patient Spontanous Breathing and Patient connected to nasal cannula oxygen  Post-op Assessment: Report given to RN  Post vital signs: Reviewed and stable  Last Vitals: 148/85, 58, 12, 100% Vitals:   10/02/17 0708  BP: (!) 144/85  Pulse: 66  Resp: 16  Temp: (!) 36.2 C  SpO2: 99%    Last Pain:  Vitals:   10/02/17 0708  TempSrc: Oral      Patients Stated Pain Goal: 5 (22/44/97 5300)  Complications: No apparent anesthesia complications

## 2017-10-02 NOTE — Discharge Instructions (Addendum)
ANORECTAL SURGERY: POST OP INSTRUCTIONS 1. Take your usually prescribed home medications unless otherwise directed. 2. DIET: During the first few hours after surgery sip on some liquids until you are able to urinate.  It is normal to not urinate for several hours after this surgery.  If you feel uncomfortable, please contact the office for instructions.  After you are able to urinate,you may eat, if you feel like it.  Follow a light bland diet the first 24 hours after arrival home, such as soup, liquids, crackers, etc.  Be sure to include lots of fluids daily (6-8 glasses).  Avoid fast food or heavy meals, as your are more likely to get nauseated.  Eat a low fat diet the next few days after surgery.  Limit caffeine intake to 1-2 servings a day.  Make sure there is someone at home with you for the first 48 hrs after surgery.  3. PAIN CONTROL: a. Pain is best controlled by a usual combination of several different methods TOGETHER: i. Muscle relaxation 1.  Soak in a warm bath (or Sitz bath) three times a day and after bowel movements.  Continue to do this until all pain is resolved. 2. Take the muscle relaxer (Valium) every 8 hours as needed for the first 2 days after surgery.  Do not take at the same time as your pain medications.  Do not take your Robaxin while taking this medication. ii. Over the counter pain medication: Tylenol or Ibuprofen are ok to take per package instructions. iii. Prescription pain medication b. Most patients will experience some swelling and discomfort in the anus/rectal area and incisions.  Heat such as warm towels, sitz baths, warm baths, etc to help relax tight/sore spots and speed recovery.  Some people prefer to use ice, especially in the first couple days after surgery, as it may decrease the pain and swelling, or alternate between ice & heat.  Experiment to what works for you.  Swelling and bruising can take several weeks to resolve.  Pain can take even longer to completely  resolve. c. It is helpful to take an over-the-counter pain medication regularly for the first few weeks.  Choose one of the following that works best for you: i. Naproxen (Aleve, etc)  Two 220mg  tabs twice a day ii. Ibuprofen (Advil, etc) Three 200mg  tabs four times a day (every meal & bedtime) d. A  prescription for pain medication (such as percocet, oxycodone, hydrocodone, etc) should be given to you upon discharge.  Take your pain medication as prescribed.  i. If you are having problems/concerns with the prescription medicine (does not control pain, nausea, vomiting, rash, itching, etc), please call us [phone] to see if we need to switch you to a different pain medicine that will work better for you and/or control your side effect better. ii. If you need a refill on your pain medication, please contact your pharmacy.  They will contact our office to request authorization. Prescriptions will not be filled after 5 pm or on week-ends. 4. KEEP YOUR BOWELS REGULAR and AVOID CONSTIPATION a. The goal is one to two soft bowel movements a day.  You should at least have a bowel movement every other day. b. Avoid getting constipated.  Between the surgery and the pain medications, it is common to experience some constipation. This can be very painful after rectal surgery.  Increasing fluid intake and taking a fiber supplement (such as Metamucil, Citrucel, FiberCon, etc) 1-2 times a day regularly will usually help  prevent this problem from occurring.  A stool softener like colace is also recommended.  This can be purchased over the counter at your pharmacy.  You can take it up to 3 times a day.  If you do not have a bowel movement after 24 hrs since your surgery, take one does of milk of magnesia.  If you still haven't had a bowel movement 8-12 hours after that dose, take another dose.  If you don't have a bowel movement 48 hrs after surgery, purchase a Fleets enema from the drug store and administer gently  per package instructions.  If you still are having trouble with your bowel movements after that, please call the office for further instructions. c. If you develop diarrhea or have many loose bowel movements, simplify your diet to bland foods & liquids for a few days.  Stop any stool softeners and decrease your fiber supplement.  Switching to mild anti-diarrheal medications (Kayopectate, Pepto Bismol) can help.  If this worsens or does not improve, please call us.  5. Wound Care a. Remove your bandages before your first bowel movement or 8 hours after surgery.     b. Remove any wound packing material at this tim,e as well.  You do not need to repack the wound unless instructed otherwise.  Wear an absorbent pad or soft cotton gauze in your underwear to catch any drainage and help keep the area clean. You should change this every 2-3 hours while awake. c. Keep the area clean and dry.  Bathe / shower every day, especially after bowel movements.  Keep the area clean by showering / bathing over the incision / wound.   It is okay to soak an open wound to help wash it.  Wet wipes or showers / gentle washing after bowel movements is often less traumatic than regular toilet paper. d. Dennis Bast may have some styrofoam-like soft packing in the rectum which will come out with the first bowel movement.  e. You will often notice bleeding with bowel movements.  This should slow down by the end of the first week of surgery f. Expect some drainage.  This should slow down, too, by the end of the first week of surgery.  Wear an absorbent pad or soft cotton gauze in your underwear until the drainage stops. g. Do Not sit on a rubber or pillow ring.  This can make you symptoms worse.  You may sit on a soft pillow if needed.  6. ACTIVITIES as tolerated:   a. You may resume regular (light) daily activities beginning the next day--such as daily self-care, walking, climbing stairs--gradually increasing activities as tolerated.  If you  can walk 30 minutes without difficulty, it is safe to try more intense activity such as jogging, treadmill, bicycling, low-impact aerobics, swimming, etc. b. Save the most intensive and strenuous activity for last such as sit-ups, heavy lifting, contact sports, etc  Refrain from any heavy lifting or straining until you are off narcotics for pain control.   c. You may drive when you are no longer taking prescription pain medication, you can comfortably sit for long periods of time, and you can safely maneuver your car and apply brakes. d. Dennis Bast may have sexual intercourse when it is comfortable.  7. FOLLOW UP in our office a. Please call [facility] at [phone] to set up an appointment to see your surgeon in the office for a follow-up appointment approximately 3-4 weeks after your surgery. b. Make sure that you call for  this appointment the day you arrive home to insure a convenient appointment time. 10. IF YOU HAVE DISABILITY OR FAMILY LEAVE FORMS, BRING THEM TO THE OFFICE FOR PROCESSING.  DO NOT GIVE THEM TO YOUR DOCTOR.     WHEN TO CALL us [phone]: 1. Poor pain control 2. Reactions / problems with new medications (rash/itching, nausea, etc)  3. Fever over 101.5 F (38.5 C) 4. Inability to urinate 5. Nausea and/or vomiting 6. Worsening swelling or bruising 7. Continued bleeding from incision. 8. Increased pain, redness, or drainage from the incision  The clinic staff is available to answer your questions during regular business hours (8:30am-5pm).  Please dont hesitate to call and ask to speak to one of our nurses for clinical concerns.   A surgeon from [facility] is always on call at the hospitals   If you have a medical emergency, go to the nearest emergency room or call 911.    [facility], [place], [place], [place], [place]  [address] ? MAIN: [phone] ? TOLL FREE: [phone] ? FAX [phone]  [phone] [email or web address]  Information for Discharge Teaching: EXPAREL (bupivacaine liposome injectable suspension)   Your surgeon gave you EXPAREL(bupivacaine) in your surgical incision to help control your pain after surgery.   EXPAREL is a local anesthetic that provides pain relief by numbing the tissue around the surgical site.  EXPAREL is designed to release pain medication over time and can control pain for up to 72 hours.  Depending on how you respond to EXPAREL, you may require less pain medication during your recovery.  Possible side effects:  Temporary loss of sensation or ability to move in the area where bupivacaine was injected.  Nausea, vomiting, constipation  Rarely, numbness and tingling in your mouth or lips, lightheadedness, or anxiety may occur.  Call your doctor right away if you think you may be experiencing any of these sensations, or if you have other questions regarding possible side effects.  Follow all other discharge instructions given to you by your surgeon or nurse. Eat a healthy diet and drink plenty of water or other fluids.  If you return to the hospital for any reason within 96 hours following the administration of EXPAREL, please inform your health care providers. Post Anesthesia Home Care Instructions  Activity: Get plenty of rest for the remainder of the day. A responsible individual must stay with you for 24 hours following the procedure.  For the next 24 hours, DO NOT: -Drive a car -Paediatric nurse -Drink alcoholic beverages -Take any medication unless instructed by your physician -Make any legal decisions or sign important papers.  Meals: Start with liquid foods such as gelatin or soup. Progress to regular foods as tolerated. Avoid greasy, spicy, heavy foods. If nausea and/or vomiting occur, drink only clear liquids until the nausea and/or vomiting subsides. Call your physician if vomiting continues.  Special  Instructions/Symptoms: Your throat may feel dry or sore from the anesthesia or the breathing tube placed in your throat during surgery. If this causes discomfort, gargle with warm salt water. The discomfort should disappear within 24 hours.  If you had a scopolamine patch placed behind your ear for the management of post- operative nausea and/or vomiting:  1. The medication in the patch is effective for 72 hours, after which it should be removed.  Wrap patch in a tissue and discard in the trash. Wash hands thoroughly with soap and water. 2. You may remove the patch earlier than  72 hours if you experience unpleasant side effects which may include dry mouth, dizziness or visual disturbances. 3. Avoid touching the patch. Wash your hands with soap and water after contact with the patch.

## 2017-10-02 NOTE — Anesthesia Procedure Notes (Signed)
Procedure Name: MAC Date/Time: 10/02/2017 9:14 AM Performed by: Bethena Roys T Pre-anesthesia Checklist: Patient identified, Timeout performed, Emergency Drugs available, Suction available and Patient being monitored Patient Re-evaluated:Patient Re-evaluated prior to induction Oxygen Delivery Method: Nasal cannula Placement Confirmation: positive ETCO2

## 2017-10-02 NOTE — Anesthesia Postprocedure Evaluation (Signed)
Anesthesia Post Note  Patient: Marvin West  Procedure(s) Performed: HEMORRHOIDECTOMY (N/A Rectum)     Patient location during evaluation: PACU Anesthesia Type: MAC Level of consciousness: awake, awake and alert and oriented Pain management: pain level controlled Vital Signs Assessment: post-procedure vital signs reviewed and stable Respiratory status: spontaneous breathing, nonlabored ventilation and respiratory function stable Cardiovascular status: blood pressure returned to baseline Anesthetic complications: no    Last Vitals:  Vitals:   10/02/17 1030 10/02/17 1045  BP: (!) 165/88 (!) 163/88  Pulse: 64 (!) 58  Resp: 15 15  Temp:    SpO2: 100% 99%    Last Pain:  Vitals:   10/02/17 0708  TempSrc: Oral                 Zeb Rawl COKER

## 2017-10-02 NOTE — Op Note (Addendum)
10/02/2017  9:49 AM  PATIENT:  Marvin West  83 y.o. male  Patient Care Team: Lavone Orn, MD as PCP - General (Internal Medicine)  PRE-OPERATIVE DIAGNOSIS:  GRADE 3 INTERNAL HEMORRHOIDS   POST-OPERATIVE DIAGNOSIS:  GRADE 3 INTERNAL HEMORRHOIDS  PROCEDURE:  Procedure(s): 2 COLUMN HEMORRHOIDECTOMY    Surgeon(s): Leighton Ruff, MD  ASSISTANT: none   ANESTHESIA:   local and MAC  SPECIMEN:  Source of Specimen:  R posterior and L lateral internal hemorrhoids  DISPOSITION OF SPECIMEN:  PATHOLOGY  COUNTS:  YES  PLAN OF CARE: Discharge to home after PACU  PATIENT DISPOSITION:  PACU - hemodynamically stable.  INDICATION: 83 y.o. M with rectal bleeding.  It was noted to have grade 3 hemorrhoids. He elected to undergo resection.   OR FINDINGS: grade 3 left lateral and right posterior internal hemorrhoids.  DESCRIPTION: the patient was identified in the preoperative holding area and taken to the OR where they were laid on the operating room table.  MAC anesthesia was induced without difficulty. The patient was then positioned in prone jackknife position with buttocks gently taped apart.  The patient was then prepped and draped in usual sterile fashion.  SCDs were noted to be in place prior to the initiation of anesthesia. A surgical timeout was performed indicating the correct patient, procedure, positioning and need for preoperative antibiotics.  A rectal block was performed using Marcaine with epinephrine mixed with Experel.    I began with a digital rectal exam.  There were no firm masses.  I then placed a Hill-Ferguson anoscope into the anal canal and evaluated this completely.  The patient had a grade 3 right posterior and left lateral internal hemorrhoid. There was a small external component on both. I began with the right posterior hemorrhoid. This was elevated using an Allis clamp and an incision was made in the skin using Metzenbaum scissors. Dissection was carried down to  the sphincter complex and all hemorrhoid tissue was removed above the complex listening the sphincter intact below.  Once this was complete, the specimen was sent to pathology and they mucosa was reapproximated using a running 2-0 chromic suture. Distal to the dentate line was then closed using a running 3-0 chromic suture. Hemostasis was good. I then turned my attention to the left lateral internal hemorrhoid. This was also excised in a similar fashion. It was closed with a 2-0 and 3-0 chromic suture.  Additional local anesthesia was applied subcutaneously. Lidocaine ointment was placed on top of the wounds. Hemostasis was good. A dressing was applied. The patient was then awakened from anesthesia and sent to the post anesthesia care unit in stable condition. All counts were correct per operating room staff.  Search of the [employer] showed no matching patient.

## 2017-10-02 NOTE — Anesthesia Preprocedure Evaluation (Signed)
Anesthesia Evaluation  Patient identified by MRN, date of birth, ID band Patient awake    Reviewed: Allergy & Precautions, NPO status , Patient's Chart, lab work & pertinent test results  Airway Mallampati: II  TM Distance: >3 FB Neck ROM: Full    Dental  (+) Teeth Intact, Dental Advisory Given   Pulmonary    breath sounds clear to auscultation       Cardiovascular hypertension,  Rhythm:Regular Rate:Normal     Neuro/Psych    GI/Hepatic   Endo/Other    Renal/GU      Musculoskeletal   Abdominal   Peds  Hematology   Anesthesia Other Findings   Reproductive/Obstetrics                             Anesthesia Physical Anesthesia Plan  ASA: III  Anesthesia Plan: MAC   Post-op Pain Management:    Induction:   PONV Risk Score and Plan: 1 and Ondansetron and Dexamethasone  Airway Management Planned: Natural Airway and Simple Face Mask  Additional Equipment:   Intra-op Plan:   Post-operative Plan:   Informed Consent: I have reviewed the patients History and Physical, chart, labs and discussed the procedure including the risks, benefits and alternatives for the proposed anesthesia with the patient or authorized representative who has indicated his/her understanding and acceptance.   Dental advisory given  Plan Discussed with: CRNA and Anesthesiologist  Anesthesia Plan Comments:         Anesthesia Quick Evaluation

## 2017-10-02 NOTE — H&P (View-Only) (Signed)
History of Present Illness Marvin Ruff MD; 4/31/5400 9:16 AM) The patient is a 83 year old male who presents with hemorrhoids. 83 year old male with previous hemorrhoid procedure in 2004 for who presents to the office for evaluation of rectal bleeding. He is had 2 colonoscopies in the past 5 years which showed no signs of malignancy. He reports intermittent bleeding especially with irregular bowel habits. He notes blood in his stool as well as bright red bleeding on his toilet paper.   Past Surgical History Malachy Moan, [place]; 09/16/2017 8:52 AM) Breast Mass; Local Excision Bilateral. Colon Polyp Removal - Open Foot Surgery Bilateral. Oral Surgery Spinal Surgery - Lower Back TURP  Diagnostic Studies History Malachy Moan, RMA; 09/16/2017 8:52 AM) Colonoscopy 1-5 years ago  Allergies Malachy Moan, RMA; 09/16/2017 8:53 AM) No Known Drug Allergies 09/16/2017  Medication History Malachy Moan, RMA; 09/16/2017 8:55 AM) Allopurinol (300MG  Tablet, Oral) Active. Amlodipine-Atorvastatin (5-40MG  Tablet, Oral) Active. Clindamycin Phosphate (1% Gel, External) Active. HydroCHLOROthiazide (25MG  Tablet, Oral) Active. Irbesartan (300MG  Tablet, Oral) Active. Pennsaid (2% Solution, Transdermal) Active. Metoprolol Succinate ER (100MG  Tablet ER 24HR, Oral) Active. Tamsulosin HCl (0.4MG  Capsule, Oral) Active. Cialis (20MG  Tablet, Oral) Active. Medications Reconciled  Social History Malachy Moan, [place]; 09/16/2017 8:52 AM) Alcohol use Occasional alcohol use. Caffeine use Coffee, Tea. Illicit drug use Prefer to discuss with provider. Tobacco use Current some day smoker.  Family History Malachy Moan, [place]; 09/16/2017 8:52 AM) First Degree Relatives No pertinent family history  Other Problems Malachy Moan, RMA; 09/16/2017 8:52 AM) Arthritis Back Pain Gastroesophageal Reflux Disease High blood pressure     Review of Systems Malachy Moan RMA; 09/16/2017 8:52 AM) General Not Present- Appetite Loss, Chills, Fatigue, Fever, Night Sweats, Weight Gain and Weight Loss. Skin Not Present- Change in Wart/Mole, Dryness, Hives, Jaundice, New Lesions, Non-Healing Wounds, Rash and Ulcer. HEENT Present- Wears glasses/contact lenses. Not Present- Earache, Hearing Loss, Hoarseness, Nose Bleed, Oral Ulcers, Ringing in the Ears, Seasonal Allergies, Sinus Pain, Sore Throat, Visual Disturbances and Yellow Eyes. Breast Not Present- Breast Mass, Breast Pain, Nipple Discharge and Skin Changes. Cardiovascular Not Present- Chest Pain, Difficulty Breathing Lying Down, Leg Cramps, Palpitations, Rapid Heart Rate, Shortness of Breath and Swelling of Extremities. Gastrointestinal Present- Bloody Stool and Hemorrhoids. Not Present- Abdominal Pain, Bloating, Change in Bowel Habits, Chronic diarrhea, Constipation, Difficulty Swallowing, Excessive gas, Gets full quickly at meals, Indigestion, Nausea, Rectal Pain and Vomiting. Male Genitourinary Present- Frequency and Nocturia. Not Present- Blood in Urine, Change in Urinary Stream, Impotence, Painful Urination, Urgency and Urine Leakage. Musculoskeletal Present- Back Pain and Joint Stiffness. Not Present- Joint Pain, Muscle Pain, Muscle Weakness and Swelling of Extremities. Neurological Not Present- Decreased Memory, Fainting, Headaches, Numbness, Seizures, Tingling, Tremor, Trouble walking and Weakness. Psychiatric Present- Frequent crying. Not Present- Anxiety, Bipolar, Change in Sleep Pattern, Depression and Fearful. Endocrine Not Present- Cold Intolerance, Excessive Hunger, Hair Changes, Heat Intolerance, Hot flashes and New Diabetes. Hematology Not Present- Blood Thinners, Easy Bruising, Excessive bleeding, Gland problems, HIV and Persistent Infections.  Vitals Malachy Moan RMA; 09/16/2017 8:55 AM) 09/16/2017 8:55 AM Weight: 172 lb Height: 69.5in Body Surface Area: 1.95 m Body Mass Index: 25.04  kg/m  Temp.: 98.87F  Pulse: 54 (Regular)  BP: 150/86 (Sitting, Left Arm, Standard)      Physical Exam Marvin Ruff MD; 8/67/6195 9:17 AM)  General Mental Status-Alert. General Appearance-Not in acute distress. Build & Nutrition-Well nourished. Posture-Normal posture. Gait-Normal.  Head and Neck Head-normocephalic, atraumatic with no lesions or palpable masses. Trachea-midline.  Chest and Lung  Exam Chest and lung exam reveals -on auscultation, normal breath sounds, no adventitious sounds and normal vocal resonance.  Cardiovascular Cardiovascular examination reveals -normal heart sounds, regular rate and rhythm with no murmurs, femoral artery auscultation bilaterally reveals normal pulses, no bruits, no thrills and no digital clubbing, cyanosis, edema, increased warmth or tenderness.  Abdomen Inspection Inspection of the abdomen reveals - No Hernias. Palpation/Percussion Palpation and Percussion of the abdomen reveal - Soft, Non Tender, No Rigidity (guarding), No hepatosplenomegaly and No Palpable abdominal masses.  Rectal Anorectal Exam External - skin tag. Internal - normal sphincter tone.  Neurologic Neurologic evaluation reveals -alert and oriented x 3 with no impairment of recent or remote memory, normal attention span and ability to concentrate, normal sensation and normal coordination.  Musculoskeletal Normal Exam - Bilateral-Upper Extremity Strength Normal and Lower Extremity Strength Normal.   Results Marvin Ruff MD; 1/58/3094 9:18 AM) Procedures  Name Value Date ANOSCOPY, DIAGNOSTIC (07680) [ Hemorrhoids ] Procedure Other: Procedure: Anoscopy Surgeon: Marcello Moores After the risks and benefits were explained, verbal consent was obtained for above procedure. A medical assistant chaperone was present thoroughout the entire procedure. Anesthesia: none Diagnosis: Rectal bleeding Findings: Grade 3 right posterior and left lateral  internal hemorrhoids with significant inflammation  Performed: 09/16/2017 9:17 AM    Assessment & Plan Marvin Ruff MD; 8/81/1031 9:20 AM)  PROLAPSED INTERNAL HEMORRHOIDS, GRADE 3 (R94.5) Impression: 83 year old male who presents to the office with rectal bleeding. On exam he has 2 rather large grade 3 internal hemorrhoids. I have recommended hemorrhoidectomy. I believe he understands that this has a good chance of resolving his symptoms but will wake wire several weeks of recovery and postoperative pain.

## 2017-10-02 NOTE — Interval H&P Note (Signed)
History and Physical Interval Note:  10/02/2017 7:29 AM  Marvin West  has presented today for surgery, with the diagnosis of RECTAL BLEEDING   The various methods of treatment have been discussed with the patient and family. After consideration of risks, benefits and other options for treatment, the patient has consented to  Procedure(s): HEMORRHOIDECTOMY (N/A) as a surgical intervention .  The patient's history has been reviewed, patient examined, no change in status, stable for surgery.  I have reviewed the patient's chart and labs.  Questions were answered to the patient's satisfaction.     Rosario Adie, MD  [facility]

## 2017-10-03 ENCOUNTER — Encounter (HOSPITAL_BASED_OUTPATIENT_CLINIC_OR_DEPARTMENT_OTHER): Payer: Self-pay | Admitting: General Surgery

## 2017-10-08 DIAGNOSIS — L74510 Primary focal hyperhidrosis, axilla: Secondary | ICD-10-CM | POA: Diagnosis not present

## 2017-10-08 DIAGNOSIS — Z85828 Personal history of other malignant neoplasm of skin: Secondary | ICD-10-CM | POA: Diagnosis not present

## 2017-10-13 ENCOUNTER — Ambulatory Visit: Payer: Medicare Other | Attending: Internal Medicine | Admitting: Physical Therapy

## 2017-10-23 ENCOUNTER — Ambulatory Visit: Payer: Medicare Other

## 2017-10-27 ENCOUNTER — Ambulatory Visit: Payer: Medicare Other | Attending: Internal Medicine

## 2017-10-27 DIAGNOSIS — R293 Abnormal posture: Secondary | ICD-10-CM | POA: Diagnosis not present

## 2017-10-27 DIAGNOSIS — M436 Torticollis: Secondary | ICD-10-CM | POA: Diagnosis not present

## 2017-10-27 DIAGNOSIS — R252 Cramp and spasm: Secondary | ICD-10-CM

## 2017-10-27 DIAGNOSIS — M542 Cervicalgia: Secondary | ICD-10-CM | POA: Diagnosis not present

## 2017-10-27 DIAGNOSIS — E291 Testicular hypofunction: Secondary | ICD-10-CM | POA: Diagnosis not present

## 2017-10-27 NOTE — Patient Instructions (Signed)
Posture ed with cervical /capital flexion , trap stretch and scapula retraction .  3-4x/day 2-3 reps 10-20 sec hold

## 2017-10-27 NOTE — Therapy (Addendum)
[place], [place], [address] Phone: [phone]   Fax:  [phone]  Physical Therapy Evaluation  Patient Details  Name: KORDAE BUONOCORE MRN: [id number] Date of Birth: 01/12/1934 Referring Provider: Lavone Orn, MD   Encounter Date: 10/27/2017  PT End of Session - 10/27/17 1329    Visit Number  1    Number of Visits  12    Date for PT Re-Evaluation  12/05/17    Authorization Type  MCR    PT Start Time  0115    PT Stop Time  0200    PT Time Calculation (min)  45 min    Activity Tolerance  Patient tolerated treatment well;Patient limited by pain    Behavior During Therapy  [facility] for tasks assessed/performed       Past Medical History:  Diagnosis Date  . Arthritis   . Benign localized prostatic hyperplasia with lower urinary tract symptoms (LUTS)   . Chronic back pain   . CKD (chronic kidney disease), stage III ([place])   . Diverticulosis   . DJD (degenerative joint disease)   . ED (erectile dysfunction)   . GERD (gastroesophageal reflux disease)   . Gout    per last attack over a year 2016 approx.  . Hemorrhoids   . Hyperlipidemia   . Hypertension   . Hypogonadism in male   . OSA on CPAP   . Rectal bleeding   . Wears glasses     Past Surgical History:  Procedure Laterality Date  . ANAL FISTULOTOMY  07-04-2003     [facility]   and Internal Hemorrhoid Banding  . CATARACT EXTRACTION W/ INTRAOCULAR LENS  IMPLANT, BILATERAL  2013 approx.  . INCISION AND DRAINAGE PERIRECTAL ABSCESS  03-22-2003     [facility]  . INGUINAL HERNIA REPAIR Right 08-14-2004    dr Ninfa Linden [facility]  . LUMBAR DISECTOMY  03/ 2015        [facility])  . NASAL SINUS SURGERY  07/29/2001  . THUMB ARTHROTOMY/  REMOVAL MULTIPLE LOOSE BODIES/ SYNVECTOMY/  RESECTION MUCOID CYST Left 01-16-2010    dr sypher  [facility]  . TOE SURGERY Bilateral   . TRANSURETHRAL RESECTION OF PROSTATE  1983  . UVULOPALATOPHARYNGOPLASTY  1993    There were no vitals filed  for this visit.   Subjective Assessment - 10/27/17 1321    Subjective  He reports pain in neck and shoulder also stiffness.   Liftied 10 pounds at gym bilateral and began to have neck and shoulder pain.     Limitations  Lifting moving  head ,   pain with lifting boxes ,   Turning with driving.    moving  head ,   pain with lifting boxes ,   Turning with driving.    How long can you sit comfortably?  NA    How long can you stand comfortably?  NA    How long can you walk comfortably?  NA    Diagnostic tests  xrays;    Patient Stated Goals  Decreasee pain and stiffness    Currently in Pain?  Yes    Pain Location  Neck    Pain Orientation  Right;Left;Posterior    Pain Descriptors / Indicators  Tightness;Sore    Pain Type  Chronic pain    Pain Radiating Towards  to base of skull, to traps    Pain Onset  More than a month ago    Pain Frequency  Constant  Aggravating Factors   looking up and otation    Pain Relieving Factors  rest    Multiple Pain Sites  No         [facility] PT Assessment - 10/27/17 0001      Assessment   Medical Diagnosis  cervical pain and stiffness    Referring Provider  Lavone Orn, MD    Onset Date/Surgical Date  -- 4-5 months   4-5 months   Hand Dominance  Right    Next MD Visit  As needed    Prior Therapy  no      Precautions   Precautions  None      Restrictions   Weight Bearing Restrictions  No      Balance Screen   Has the patient fallen in the past 6 months  No      Prior Function   Level of Independence  Independent      Cognition   Overall Cognitive Status  Within Functional Limits for tasks assessed      Posture/Postural Control   Posture Comments  Forward head.       ROM / Strength   AROM / PROM / Strength  AROM;Strength      AROM   Overall AROM Comments  bilateral shoulder motion limited mostly with IR to 40 degrees    AROM Assessment Site  Cervical    Cervical Flexion  40    Cervical Extension  15    Cervical - Right Side Bend   10    Cervical - Left Side Bend  10    Cervical - Right Rotation  28    Cervical - Left Rotation  20      Strength   Overall Strength Comments  Normal UE strength bilaterally      Ambulation/Gait   Ambulation/Gait  No             Objective measurements completed on examination: See above findings.      [place] Adult PT Treatment/Exercise - 10/27/17 0001      Exercises   Exercises  Neck      Neck Exercises: Seated   Postural Training  cervical flexion with chin tuck and scapula retraction with out elevation and side bend stretch with hand on chair for trap stretch      Neck Exercises: Supine   Capital Flexion  5 reps;5 secs      Modalities   Modalities  Traction;Ultrasound;Moist Heat      Moist Heat Therapy   Number Minutes Moist Heat  12 Minutes    Moist Heat Location  Cervical      Manual Therapy   Manual Therapy  Joint mobilization;Soft tissue mobilization;Passive ROM;Manual Traction             PT Education - 10/27/17 1357    Education provided  Yes    Education Details  POc , HEP    Person(s) Educated  Patient    Methods  Explanation;Demonstration;Tactile cues;Handout    Comprehension  Returned demonstration;Verbalized understanding       PT Short Term Goals - 10/27/17 1312      PT SHORT TERM GOAL #1   Title  Hew will be independent with initial HEP    Time  2    Period  Weeks    Status  New      PT SHORT TERM GOAL #2   Title  He will report pain decreased 25% or more in neck    Time  3    Period  Weeks    Status  New      PT SHORT TERM GOAL #3   Title  Cervical rotation will incr to  bilaterally to 45 degrees    Time  3    Period  Weeks    Status  New        PT Long Term Goals - 10/27/17 1313      PT LONG TERM GOAL #1   Title  He will be independent with all HEP issued    Time  6    Period  Weeks    Status  New      PT LONG TERM GOAL #2   Title  He will report pain decreased 75% or more with   rotation to look for traffic     Time  6    Period  Weeks    Status  New      PT LONG TERM GOAL #3   Title  He will improve  to 50 degrees cervical rotation to allow normal looking to side and behind     Time  6    Period  Weeks    Status  New      PT LONG TERM GOAL #4   Title  He will be able to get out of bed without pain    Time  6    Period  Weeks    Status  New      G-Code :    Clinical judgement       PT other     Initial    CL    Goal    CK       Plan - 10/27/17 1329    Clinical Impression Statement  chronic neck and trap pain from lifting too heavy a weight.   Xray shows spondylosis. Spasm noted,  Stiffness noted. pain limiting moveing neck and activity in gym    Clinical Presentation  Stable    Clinical Decision Making  Low    Rehab Potential  Good    PT Frequency  2x / week    PT Duration  6 weeks    PT Treatment/Interventions  Manual techniques;Dry needling;Moist Heat;Ultrasound;Traction;Passive range of motion;Patient/family education    PT Next Visit Plan  Modalities, manual, ROM,   traction possibel    PT Home Exercise Plan  postureal exer    Consulted and Agree with Plan of Care  Patient       Patient will benefit from skilled therapeutic intervention in order to improve the following deficits and impairments:  Decreased range of motion, Decreased activity tolerance, Postural dysfunction, Increased muscle spasms, Pain  Visit Diagnosis: Cervicalgia  Abnormal posture  Stiffness of cervical spine  Cramp and spasm     Problem List Patient Active Problem List   Diagnosis Date Noted  . HEARTBURN 05/09/2008  . ABDOMINAL BLOATING 05/09/2008  . HYPERTENSION 05/05/2008  . CONSTIPATION 05/05/2008  . ARTHRITIS 05/05/2008  . SLEEP APNEA 05/05/2008  . HERNIORRHAPHY, HX OF 05/05/2008    Darrel Hoover  PT 10/27/2017, 2:02 PM  [facility] [address] [place], [place], [address] Phone: [phone]   Fax:   [phone]  Name: RAEL YO MRN: [id number] Date of Birth: July 19, 1934

## 2017-10-29 ENCOUNTER — Encounter: Payer: 59 | Admitting: Physical Therapy

## 2017-10-29 ENCOUNTER — Ambulatory Visit: Payer: Medicare Other | Admitting: Physical Therapy

## 2017-10-29 DIAGNOSIS — R252 Cramp and spasm: Secondary | ICD-10-CM

## 2017-10-29 DIAGNOSIS — M436 Torticollis: Secondary | ICD-10-CM

## 2017-10-29 DIAGNOSIS — M542 Cervicalgia: Secondary | ICD-10-CM

## 2017-10-29 DIAGNOSIS — R293 Abnormal posture: Secondary | ICD-10-CM

## 2017-10-30 ENCOUNTER — Encounter: Payer: Self-pay | Admitting: Physical Therapy

## 2017-10-30 NOTE — Therapy (Signed)
[place], [place], [address] Phone: [phone]   Fax:  [phone]  Physical Therapy Treatment  Patient Details  Name: Marvin West MRN: [id number] Date of Birth: 05-04-1934 Referring Provider: Lavone Orn, MD   Encounter Date: 10/29/2017  PT End of Session - 10/30/17 1534    Visit Number  2    Number of Visits  12    Date for PT Re-Evaluation  12/05/17    Authorization Type  MCR    PT Start Time  1630    PT Stop Time  1723    PT Time Calculation (min)  53 min    Activity Tolerance  Patient tolerated treatment well;Patient limited by pain    Behavior During Therapy  [facility] for tasks assessed/performed       Past Medical History:  Diagnosis Date  . Arthritis   . Benign localized prostatic hyperplasia with lower urinary tract symptoms (LUTS)   . Chronic back pain   . CKD (chronic kidney disease), stage III ([place])   . Diverticulosis   . DJD (degenerative joint disease)   . ED (erectile dysfunction)   . GERD (gastroesophageal reflux disease)   . Gout    per last attack over a year 2016 approx.  . Hemorrhoids   . Hyperlipidemia   . Hypertension   . Hypogonadism in male   . OSA on CPAP   . Rectal bleeding   . Wears glasses     Past Surgical History:  Procedure Laterality Date  . ANAL FISTULOTOMY  07-04-2003     [facility]   and Internal Hemorrhoid Banding  . CATARACT EXTRACTION W/ INTRAOCULAR LENS  IMPLANT, BILATERAL  2013 approx.  . INCISION AND DRAINAGE PERIRECTAL ABSCESS  03-22-2003     [facility]  . INGUINAL HERNIA REPAIR Right 08-14-2004    dr Ninfa Linden [facility]  . LUMBAR DISECTOMY  03/ 2015        [facility])  . NASAL SINUS SURGERY  07/29/2001  . THUMB ARTHROTOMY/  REMOVAL MULTIPLE LOOSE BODIES/ SYNVECTOMY/  RESECTION MUCOID CYST Left 01-16-2010    dr sypher  [facility]  . TOE SURGERY Bilateral   . TRANSURETHRAL RESECTION OF PROSTATE  1983  . UVULOPALATOPHARYNGOPLASTY  1993    There were no vitals filed  for this visit.  Subjective Assessment - 10/30/17 1532    Subjective  Patient reports no real diffirence. He has been mvoing some boxes and is having some lower back pain too.     Limitations  Lifting    Patient Stated Goals  Decreasee pain and stiffness    Currently in Pain?  Yes    Pain Score  6     Pain Location  Neck    Pain Orientation  Right;Left;Posterior    Pain Descriptors / Indicators  Tightness;Sore    Pain Type  Chronic pain    Pain Radiating Towards  to the base of the skull     Pain Onset  More than a month ago    Pain Frequency  Constant    Aggravating Factors   looking up and rotation     Pain Relieving Factors  restr     Multiple Pain Sites  No                      [facility] Adult PT Treatment/Exercise - 10/30/17 0001      Neck Exercises: Standing   Other Standing Exercises  scap retraction red 2x10; shoulder  extension 2x10;       Moist Heat Therapy   Number Minutes Moist Heat  10 Minutes    Moist Heat Location  Cervical      Manual Therapy   Manual Therapy  Joint mobilization;Soft tissue mobilization;Passive ROM;Manual Traction    Soft tissue mobilization  to upper trap and cervical spine muscle tightness     Manual Traction  sub-occipital release; genlt manual traction              PT Education - 10/30/17 1534    Education provided  Yes    Education Details  reviewed exercises     Person(s) Educated  Patient    Methods  Explanation;Demonstration;Tactile cues;Handout    Comprehension  Returned demonstration;Verbal cues required;Verbalized understanding;Need further instruction       PT Short Term Goals - 10/30/17 1539      PT SHORT TERM GOAL #1   Title  He will be independent with initial HEP    Time  2    Period  Weeks    Status  On-going      PT SHORT TERM GOAL #2   Title  He will report pain decreased 25% or more in neck    Baseline  no change     Time  3    Period  Weeks    Status  On-going      PT SHORT TERM GOAL #3    Title  Cervical rotation will incr to  bilaterally to 45 degrees    Time  3    Period  Weeks    Status  On-going        PT Long Term Goals - 10/27/17 1313      PT LONG TERM GOAL #1   Title  He will be independent with all HEP issued    Time  6    Period  Weeks    Status  New      PT LONG TERM GOAL #2   Title  He will report pain decreased 75% or more with   rotation to look for traffic    Time  6    Period  Weeks    Status  New      PT LONG TERM GOAL #3   Title  He will improve  to 50 degrees cervical rotation to allow normal looking to side and behind     Time  6    Period  Weeks    Status  New      PT LONG TERM GOAL #4   Title  He will be able to get out of bed without pain    Time  6    Period  Weeks    Status  New            Plan - 10/30/17 1537    Clinical Impression Statement  Patient tolerated treatment well but he continued to have limitations in rotation. He was encouraged to be aware of his posture when he is lifting boxes and doing activity around his house. He reported improved pain with manual therapy.     Clinical Presentation  Stable    Clinical Decision Making  Low    Rehab Potential  Good    PT Frequency  2x / week    PT Duration  6 weeks    PT Treatment/Interventions  Manual techniques;Dry needling;Moist Heat;Ultrasound;Traction;Passive range of motion;Patient/family education    PT Next Visit Plan  Modalities, manual, ROM,  traction possibel    PT Home Exercise Plan  postureal exer    Consulted and Agree with Plan of Care  Patient       Patient will benefit from skilled therapeutic intervention in order to improve the following deficits and impairments:  Decreased range of motion, Decreased activity tolerance, Postural dysfunction, Increased muscle spasms, Pain  Visit Diagnosis: Cervicalgia  Abnormal posture  Stiffness of cervical spine  Cramp and spasm     Problem List Patient Active Problem List   Diagnosis Date Noted  .  HEARTBURN 05/09/2008  . ABDOMINAL BLOATING 05/09/2008  . HYPERTENSION 05/05/2008  . CONSTIPATION 05/05/2008  . ARTHRITIS 05/05/2008  . SLEEP APNEA 05/05/2008  . HERNIORRHAPHY, HX OF 05/05/2008    Carney Living PT DPT  10/30/2017, 3:41 PM  [facility] [address] [place], [place], [address] Phone: [phone]   Fax:  [phone]  Name: Marvin West MRN: [id number] Date of Birth: Aug 14, 1934

## 2017-11-03 ENCOUNTER — Ambulatory Visit: Payer: Medicare Other

## 2017-11-03 DIAGNOSIS — M542 Cervicalgia: Secondary | ICD-10-CM

## 2017-11-03 DIAGNOSIS — M436 Torticollis: Secondary | ICD-10-CM | POA: Diagnosis not present

## 2017-11-03 DIAGNOSIS — R293 Abnormal posture: Secondary | ICD-10-CM

## 2017-11-03 DIAGNOSIS — R252 Cramp and spasm: Secondary | ICD-10-CM | POA: Diagnosis not present

## 2017-11-03 NOTE — Therapy (Signed)
[place], [place], [address] Phone: [phone]   Fax:  [phone]  Physical Therapy Treatment  Patient Details  Name: Marvin West MRN: [id number] Date of Birth: 02-Mar-1934 Referring Provider: Lavone Orn, MD   Encounter Date: 11/03/2017  PT End of Session - 11/03/17 1413    Visit Number  3    Number of Visits  12    Date for PT Re-Evaluation  12/05/17    Authorization Type  MCR    PT Start Time  0135    PT Stop Time  0230    PT Time Calculation (min)  55 min    Activity Tolerance  Patient tolerated treatment well;Patient limited by pain    Behavior During Therapy  [facility] for tasks assessed/performed       Past Medical History:  Diagnosis Date  . Arthritis   . Benign localized prostatic hyperplasia with lower urinary tract symptoms (LUTS)   . Chronic back pain   . CKD (chronic kidney disease), stage III ([place])   . Diverticulosis   . DJD (degenerative joint disease)   . ED (erectile dysfunction)   . GERD (gastroesophageal reflux disease)   . Gout    per last attack over a year 2016 approx.  . Hemorrhoids   . Hyperlipidemia   . Hypertension   . Hypogonadism in male   . OSA on CPAP   . Rectal bleeding   . Wears glasses     Past Surgical History:  Procedure Laterality Date  . ANAL FISTULOTOMY  07-04-2003     [facility]   and Internal Hemorrhoid Banding  . CATARACT EXTRACTION W/ INTRAOCULAR LENS  IMPLANT, BILATERAL  2013 approx.  . INCISION AND DRAINAGE PERIRECTAL ABSCESS  03-22-2003     [facility]  . INGUINAL HERNIA REPAIR Right 08-14-2004    dr Ninfa Linden [facility]  . LUMBAR DISECTOMY  03/ 2015        [facility])  . NASAL SINUS SURGERY  07/29/2001  . THUMB ARTHROTOMY/  REMOVAL MULTIPLE LOOSE BODIES/ SYNVECTOMY/  RESECTION MUCOID CYST Left 01-16-2010    dr sypher  [facility]  . TOE SURGERY Bilateral   . TRANSURETHRAL RESECTION OF PROSTATE  1983  . UVULOPALATOPHARYNGOPLASTY  1993    There were no vitals filed  for this visit.  Subjective Assessment - 11/03/17 1415    Subjective  No changes so far.  doing HEP.    need to comtinue manual and modalities.   exer    Currently in Pain?  Yes    Pain Score  6     Pain Location  Neck    Pain Orientation  Right;Left    Pain Descriptors / Indicators  Tightness;Sore    Pain Type  Chronic pain    Pain Onset  More than a month ago    Pain Frequency  Constant    Aggravating Factors   look up and rotation                      [facility] Adult PT Treatment/Exercise - 11/03/17 0001      Moist Heat Therapy   Number Minutes Moist Heat  12 Minutes    Moist Heat Location  Cervical      Ultrasound   Ultrasound Location  cervical     Ultrasound Parameters  100% 1MHz 1.3 Wcm2    Ultrasound Goals  Pain      Manual Therapy   Manual therapy comments  PA  Gr 3-4 glides     Soft tissue mobilization  to upper trap and cervical spine muscle tightness     Manual Traction  sub-occipital release; genlt manual traction                PT Short Term Goals - 10/30/17 1539      PT SHORT TERM GOAL #1   Title  He will be independent with initial HEP    Time  2    Period  Weeks    Status  On-going      PT SHORT TERM GOAL #2   Title  He will report pain decreased 25% or more in neck    Baseline  no change     Time  3    Period  Weeks    Status  On-going      PT SHORT TERM GOAL #3   Title  Cervical rotation will incr to  bilaterally to 45 degrees    Time  3    Period  Weeks    Status  On-going        PT Long Term Goals - 10/27/17 1313      PT LONG TERM GOAL #1   Title  He will be independent with all HEP issued    Time  6    Period  Weeks    Status  New      PT LONG TERM GOAL #2   Title  He will report pain decreased 75% or more with   rotation to look for traffic    Time  6    Period  Weeks    Status  New      PT LONG TERM GOAL #3   Title  He will improve  to 50 degrees cervical rotation to allow normal looking to side and  behind     Time  6    Period  Weeks    Status  New      PT LONG TERM GOAL #4   Title  He will be able to get out of bed without pain    Time  6    Period  Weeks    Status  New            Plan - 11/03/17 1413    Clinical Impression Statement  Still stiff and painful. no significnat improvement. today was more tender at mid cervical spine.  He wants to transfer closer to home so he will contact [employer] for scheduling visits    PT Treatment/Interventions  Manual techniques;Dry needling;Moist Heat;Ultrasound;Traction;Passive range of motion;Patient/family education    PT Next Visit Plan  Modalities, manual, ROM,   traction possibel    PT Home Exercise Plan  postural exer, ROM    Consulted and Agree with Plan of Care  Patient       Patient will benefit from skilled therapeutic intervention in order to improve the following deficits and impairments:  Decreased range of motion, Decreased activity tolerance, Postural dysfunction, Increased muscle spasms, Pain  Visit Diagnosis: Cervicalgia  Abnormal posture  Stiffness of cervical spine  Cramp and spasm     Problem List Patient Active Problem List   Diagnosis Date Noted  . HEARTBURN 05/09/2008  . ABDOMINAL BLOATING 05/09/2008  . HYPERTENSION 05/05/2008  . CONSTIPATION 05/05/2008  . ARTHRITIS 05/05/2008  . SLEEP APNEA 05/05/2008  . HERNIORRHAPHY, HX OF 05/05/2008    Darrel Hoover  PT 11/03/2017, 2:22 PM  [facility] [address] [place], [place], [address] Phone: [phone]   Fax:  [phone]  Name: Marvin West MRN: [id number] Date of Birth: 03-31-1934

## 2017-11-06 ENCOUNTER — Ambulatory Visit: Payer: Medicare Other | Admitting: Physical Therapy

## 2017-11-06 ENCOUNTER — Encounter: Payer: Self-pay | Admitting: Physical Therapy

## 2017-11-06 DIAGNOSIS — M542 Cervicalgia: Secondary | ICD-10-CM

## 2017-11-06 DIAGNOSIS — R293 Abnormal posture: Secondary | ICD-10-CM | POA: Diagnosis not present

## 2017-11-06 DIAGNOSIS — M436 Torticollis: Secondary | ICD-10-CM

## 2017-11-06 DIAGNOSIS — R252 Cramp and spasm: Secondary | ICD-10-CM | POA: Diagnosis not present

## 2017-11-06 NOTE — Therapy (Signed)
[place], [place], [address] Phone: [phone]   Fax:  [phone]  Physical Therapy Treatment  Patient Details  Name: Marvin West MRN: [id number] Date of Birth: [birth year] Referring Provider: Lavone Orn, MD   Encounter Date: 11/06/2017  PT End of Session - 11/06/17 1240    Visit Number  4    Number of Visits  12    Date for PT Re-Evaluation  12/05/17    Authorization Type  MCR    PT Start Time  1239    PT Stop Time  1334    PT Time Calculation (min)  55 min       Past Medical History:  Diagnosis Date  . Arthritis   . Benign localized prostatic hyperplasia with lower urinary tract symptoms (LUTS)   . Chronic back pain   . CKD (chronic kidney disease), stage III ([place])   . Diverticulosis   . DJD (degenerative joint disease)   . ED (erectile dysfunction)   . GERD (gastroesophageal reflux disease)   . Gout    per last attack over a year 2016 approx.  . Hemorrhoids   . Hyperlipidemia   . Hypertension   . Hypogonadism in male   . OSA on CPAP   . Rectal bleeding   . Wears glasses     Past Surgical History:  Procedure Laterality Date  . ANAL FISTULOTOMY  07-04-2003     [facility]   and Internal Hemorrhoid Banding  . CATARACT EXTRACTION W/ INTRAOCULAR LENS  IMPLANT, BILATERAL  2013 approx.  . COLONOSCOPY WITH PROPOFOL N/A 07/27/2013   Procedure: COLONOSCOPY WITH PROPOFOL;  Surgeon: Garlan Fair, MD;  Location: [facility];  Service: Endoscopy;  Laterality: N/A;  . HEMORRHOID SURGERY N/A 10/02/2017   Procedure: HEMORRHOIDECTOMY, Anterior and Posterior;  Surgeon: Leighton Ruff, MD;  Location: [facility];  Service: General;  Laterality: N/A;  . INCISION AND DRAINAGE PERIRECTAL ABSCESS  03-22-2003     [facility]  . INGUINAL HERNIA REPAIR Right 08-14-2004    dr Ninfa [facility]  . LUMBAR DISECTOMY  03/ 2015        [facility])  . NASAL SINUS SURGERY  07/29/2001  . THUMB ARTHROTOMY/  REMOVAL  MULTIPLE LOOSE BODIES/ SYNVECTOMY/  RESECTION MUCOID CYST Left 01-16-2010    dr sypher  [facility]  . TOE SURGERY Bilateral   . TRANSURETHRAL RESECTION OF PROSTATE  1983  . UVULOPALATOPHARYNGOPLASTY  1993    There were no vitals filed for this visit.  Subjective Assessment - 11/06/17 1340    Subjective  Pain can be a 10/10 after driving and first waking in the morning.     Currently in Pain?  Yes    Pain Score  6  up to 10/10 with movement     Pain Orientation  Right;Left    Pain Descriptors / Indicators  Tightness stiffness                      [facility] Adult PT Treatment/Exercise - 11/06/17 0001      Neck Exercises: Theraband   Shoulder External Rotation  10 reps yellow, seated    Horizontal ABduction  10 reps yellow, seated       Neck Exercises: Seated   Postural Training  cervical side bend and rotation, flexion and extension 5 sec x 5 each       Neck Exercises: Supine   Other Supine Exercise  cervical flexion 5 seconds holding off  mat x 3       Shoulder Exercises: Pulleys   Flexion  1 minute      Moist Heat Therapy   Number Minutes Moist Heat  15 Minutes    Moist Heat Location  Cervical      Manual Therapy   Manual Therapy  Passive ROM    Soft tissue mobilization  trigger point release x 3 bilateral upper traps     Passive ROM  Side bend and rotation, levator stretch     Manual Traction  cervical with rotation               PT Short Term Goals - 10/30/17 1539      PT SHORT TERM GOAL #1   Title  He will be independent with initial HEP    Time  2    Period  Weeks    Status  On-going      PT SHORT TERM GOAL #2   Title  He will report pain decreased 25% or more in neck    Baseline  no change     Time  3    Period  Weeks    Status  On-going      PT SHORT TERM GOAL #3   Title  Cervical rotation will incr to  bilaterally to 45 degrees    Time  3    Period  Weeks    Status  On-going        PT Long Term Goals - 10/27/17 1313      PT LONG  TERM GOAL #1   Title  He will be independent with all HEP issued    Time  6    Period  Weeks    Status  New      PT LONG TERM GOAL #2   Title  He will report pain decreased 75% or more with   rotation to look for traffic    Time  6    Period  Weeks    Status  New      PT LONG TERM GOAL #3   Title  He will improve  to 50 degrees cervical rotation to allow normal looking to side and behind     Time  6    Period  Weeks    Status  New      PT LONG TERM GOAL #4   Title  He will be able to get out of bed without pain    Time  6    Period  Weeks    Status  New            Plan - 11/06/17 1325    Clinical Impression Statement  Pt with questions about stretches. Discussed posture with stretches and how posture relates  to pain and decreased ROM. Pt performing more of a trunk side bend rather than cervical. Reviewed technique and demonstrates improved side bend, Began cervical AROM in all other planes. While sitting began yellow band scapular exercises with good tolerance. Performed manual trigger point release to bilateral upper traps and PROM for side bend and rotation. Pt demonstrates decreased neck flexor strength and is able to hold in supine for 5 seconds. After HMP, pt reports neck feels better.     PT Next Visit Plan  Modalities, manual, ROM,   traction possible    PT Home Exercise Plan  postural exer, ROM    Consulted and Agree with Plan of Care  Patient  Patient will benefit from skilled therapeutic intervention in order to improve the following deficits and impairments:     Visit Diagnosis: Cervicalgia  Abnormal posture  Stiffness of cervical spine  Cramp and spasm     Problem List Patient Active Problem List   Diagnosis Date Noted  . HEARTBURN 05/09/2008  . ABDOMINAL BLOATING 05/09/2008  . HYPERTENSION 05/05/2008  . CONSTIPATION 05/05/2008  . ARTHRITIS 05/05/2008  . SLEEP APNEA 05/05/2008  . HERNIORRHAPHY, HX OF 05/05/2008    Dorene Ar, PTA 11/06/2017, 1:49 PM  [place], [place], [address] Phone: [phone]   Fax:  [phone]  Name: Marvin West MRN: [id number] Date of Birth: [birth year]

## 2017-11-10 ENCOUNTER — Encounter: Payer: Self-pay | Admitting: Physical Therapy

## 2017-11-10 ENCOUNTER — Ambulatory Visit: Payer: Medicare Other | Admitting: Physical Therapy

## 2017-11-10 DIAGNOSIS — R252 Cramp and spasm: Secondary | ICD-10-CM | POA: Diagnosis not present

## 2017-11-10 DIAGNOSIS — M542 Cervicalgia: Secondary | ICD-10-CM

## 2017-11-10 DIAGNOSIS — R293 Abnormal posture: Secondary | ICD-10-CM | POA: Diagnosis not present

## 2017-11-10 DIAGNOSIS — M436 Torticollis: Secondary | ICD-10-CM | POA: Diagnosis not present

## 2017-11-10 NOTE — Therapy (Signed)
[place], [place], [address] Phone: [phone]   Fax:  [phone]  Physical Therapy Treatment  Patient Details  Name: Marvin West MRN: [id number] Date of Birth: 08/29/1934 Referring Provider: Lavone Orn, MD   Encounter Date: 11/10/2017  PT End of Session - 11/10/17 1304    Visit Number  5    Number of Visits  12    Date for PT Re-Evaluation  12/05/17    Authorization Type  MCR    PT Start Time  1300    PT Stop Time  1400    PT Time Calculation (min)  60 min       Past Medical History:  Diagnosis Date  . Arthritis   . Benign localized prostatic hyperplasia with lower urinary tract symptoms (LUTS)   . Chronic back pain   . CKD (chronic kidney disease), stage III ([place])   . Diverticulosis   . DJD (degenerative joint disease)   . ED (erectile dysfunction)   . GERD (gastroesophageal reflux disease)   . Gout    per last attack over a year 2016 approx.  . Hemorrhoids   . Hyperlipidemia   . Hypertension   . Hypogonadism in male   . OSA on CPAP   . Rectal bleeding   . Wears glasses     Past Surgical History:  Procedure Laterality Date  . ANAL FISTULOTOMY  07-04-2003     [facility]   and Internal Hemorrhoid Banding  . CATARACT EXTRACTION W/ INTRAOCULAR LENS  IMPLANT, BILATERAL  2013 approx.  . COLONOSCOPY WITH PROPOFOL N/A 07/27/2013   Performed by Garlan Fair, MD at [place]  . HEMORRHOIDECTOMY, Anterior and Posterior N/A 10/02/2017   Performed by Leighton Ruff, MD at [facility]  . INCISION AND DRAINAGE PERIRECTAL ABSCESS  03-22-2003     [facility]  . INGUINAL HERNIA REPAIR Right 08-14-2004    dr [facility]infa [facility]  . LUMBAR DISECTOMY  03/ 2015        [facility])  . NASAL SINUS SURGERY  07/29/2001  . THUMB ARTHROTOMY/  REMOVAL MULTIPLE LOOSE BODIES/ SYNVECTOMY/  RESECTION MUCOID CYST Left 01-16-2010    dr sypher  [facility]  . TOE SURGERY Bilateral   . TRANSURETHRAL RESECTION OF  PROSTATE  1983  . UVULOPALATOPHARYNGOPLASTY  1993    There were no vitals filed for this visit.  Subjective Assessment - 11/10/17 1303    Subjective  Still pain with movement but sleeping better,     Currently in Pain?  Yes    Pain Location  Neck    Pain Orientation  Posterior;Right;Left    Aggravating Factors   look up, rotate    Pain Relieving Factors  not moving                      [facility] Adult PT Treatment/Exercise - 11/10/17 0001      Neck Exercises: Seated   Postural Training  cervical side bend and rotation, flexion and extension 5 sec x 5 each     Other Seated Exercise  rotation snags with towel 3 x 15 sec each way       Neck Exercises: Supine   Neck Retraction  10 reps    Other Supine Exercise  cervical flexion hold 20 seconds , yellow band horizontal abduction, ER, pullovers     Other Supine Exercise  cervical rotation with head on small ball, AROM  Manual Therapy   Soft tissue mobilization  cervical paraspinals, SCM    Passive ROM  Side bend, rotation and rotation, levator stretch     Manual Traction  sub-occipital release; genlt manual traction              PT Education - 11/10/17 1354    Education provided  Yes    Education Details  HEP    Person(s) Educated  Patient    Methods  Explanation;Handout    Comprehension  Verbalized understanding       PT Short Term Goals - 10/30/17 1539      PT SHORT TERM GOAL #1   Title  He will be independent with initial HEP    Time  2    Period  Weeks    Status  On-going      PT SHORT TERM GOAL #2   Title  He will report pain decreased 25% or more in neck    Baseline  no change     Time  3    Period  Weeks    Status  On-going      PT SHORT TERM GOAL #3   Title  Cervical rotation will incr to  bilaterally to 45 degrees    Time  3    Period  Weeks    Status  On-going        PT Long Term Goals - 10/27/17 1313      PT LONG TERM GOAL #1   Title  He will be independent with all HEP  issued    Time  6    Period  Weeks    Status  New      PT LONG TERM GOAL #2   Title  He will report pain decreased 75% or more with   rotation to look for traffic    Time  6    Period  Weeks    Status  New      PT LONG TERM GOAL #3   Title  He will improve  to 50 degrees cervical rotation to allow normal looking to side and behind     Time  6    Period  Weeks    Status  New      PT LONG TERM GOAL #4   Title  He will be able to get out of bed without pain    Time  6    Period  Weeks    Status  New            Plan - 11/10/17 1358    Clinical Impression Statement  Pt reports significant improvement in pain with sleeping after trying smaller pillow suggested last visit. He continues with sharp pain with cervical rotation or cervical extension. Performed distraction and passive neck ROM. Instructed pt in self snags for HEP. Pt resistant to suggestion of TPDN due to fear of needles.     PT Treatment/Interventions  Manual techniques;Dry needling;Moist Heat;Ultrasound;Traction;Passive range of motion;Patient/family education    PT Next Visit Plan  Modalities, manual, ROM,   traction possible, review snags , add bands     PT Home Exercise Plan  postural exer, ROM, rotation and extension snags with towel     Consulted and Agree with Plan of Care  Patient       Patient will benefit from skilled therapeutic intervention in order to improve the following deficits and impairments:  Decreased range of motion, Decreased activity tolerance, Postural dysfunction, Increased muscle  spasms, Pain  Visit Diagnosis: Cervicalgia  Abnormal posture  Stiffness of cervical spine  Cramp and spasm     Problem List Patient Active Problem List   Diagnosis Date Noted  . HEARTBURN 05/09/2008  . ABDOMINAL BLOATING 05/09/2008  . HYPERTENSION 05/05/2008  . CONSTIPATION 05/05/2008  . ARTHRITIS 05/05/2008  . SLEEP APNEA 05/05/2008  . HERNIORRHAPHY, HX OF 05/05/2008    Dorene Ar ,  PTA 11/10/2017, 2:05 PM  [facility] [address] [place], [place], [address] Phone: [phone]   Fax:  [phone]  Name: Marvin West MRN: [id number] Date of Birth: 1934-11-23

## 2017-11-11 ENCOUNTER — Ambulatory Visit: Payer: Medicare Other

## 2017-11-11 DIAGNOSIS — M436 Torticollis: Secondary | ICD-10-CM | POA: Diagnosis not present

## 2017-11-11 DIAGNOSIS — M542 Cervicalgia: Secondary | ICD-10-CM | POA: Diagnosis not present

## 2017-11-11 DIAGNOSIS — R293 Abnormal posture: Secondary | ICD-10-CM

## 2017-11-11 DIAGNOSIS — R252 Cramp and spasm: Secondary | ICD-10-CM

## 2017-11-11 NOTE — Therapy (Addendum)
[place], [place], [address] Phone: [phone]   Fax:  [phone]  Physical Therapy Treatment  Patient Details  Name: Marvin West MRN: [id number] Date of Birth: March 04, 1934 Referring Provider: Lavone Orn, MD   Encounter Date: 11/11/2017  PT End of Session - 11/11/17 1256    Visit Number  6    Number of Visits  12    Date for PT Re-Evaluation  12/05/17    Authorization Type  MCR    PT Start Time  1255    PT Stop Time  1403    PT Time Calculation (min)  68 min    Activity Tolerance  Patient tolerated treatment well;Patient limited by pain    Behavior During Therapy  [facility] for tasks assessed/performed       Past Medical History:  Diagnosis Date  . Arthritis   . Benign localized prostatic hyperplasia with lower urinary tract symptoms (LUTS)   . Chronic back pain   . CKD (chronic kidney disease), stage III ([place])   . Diverticulosis   . DJD (degenerative joint disease)   . ED (erectile dysfunction)   . GERD (gastroesophageal reflux disease)   . Gout    per last attack over a year 2016 approx.  . Hemorrhoids   . Hyperlipidemia   . Hypertension   . Hypogonadism in male   . OSA on CPAP   . Rectal bleeding   . Wears glasses     Past Surgical History:  Procedure Laterality Date  . ANAL FISTULOTOMY  07-04-2003     [facility]   and Internal Hemorrhoid Banding  . CATARACT EXTRACTION W/ INTRAOCULAR LENS  IMPLANT, BILATERAL  2013 approx.  . COLONOSCOPY WITH PROPOFOL N/A 07/27/2013   Procedure: COLONOSCOPY WITH PROPOFOL;  Surgeon: Marvin Fair, MD;  Location: [facility];  Service: Endoscopy;  Laterality: N/A;  . HEMORRHOID SURGERY N/A 10/02/2017   Procedure: HEMORRHOIDECTOMY, Anterior and Posterior;  Surgeon: Marvin Ruff, MD;  Location: [facility];  Service: General;  Laterality: N/A;  . INCISION AND DRAINAGE PERIRECTAL ABSCESS  03-22-2003     [facility]  . INGUINAL HERNIA REPAIR Right  08-14-2004    [place] [place]  . LUMBAR DISECTOMY  03/ 2015        [facility])  . NASAL SINUS SURGERY  07/29/2001  . THUMB ARTHROTOMY/  REMOVAL MULTIPLE LOOSE BODIES/ SYNVECTOMY/  RESECTION MUCOID CYST Left 01-16-2010    [facility]  [facility]  . TOE SURGERY Bilateral   . TRANSURETHRAL RESECTION OF PROSTATE  1983  . UVULOPALATOPHARYNGOPLASTY  1993    There were no vitals filed for this visit.  Subjective Assessment - 11/11/17 1300    Subjective  When still no pain feels pain 8/10 with movement.  Useing mew pillow help with sleep    Currently in Pain?  No/denies    Pain Score  8  with movement    Pain Location  Neck    Pain Orientation  Right;Left;Posterior    Pain Descriptors / Indicators  Tightness    Pain Type  Chronic pain    Pain Onset  More than a month ago    Pain Frequency  Intermittent always with movement         [facility] PT Assessment - 11/11/17 0001      AROM   Cervical Flexion  30    Cervical Extension  15    Cervical - Right Side Bend  15    Cervical -  Left Side Bend  12    Cervical - Right Rotation  26    Cervical - Left Rotation  24                  [facility] Adult PT Treatment/Exercise - 11/11/17 0001      Neck Exercises: Seated   Postural Training  cervical retraction independent and with PA sustained glides C1 to c7    Other Seated Exercise  rotation snags with towel 2 x 15 sec each way cued to maintain pressure and place towel on cheek      Neck Exercises: Supine   Neck Retraction  10 reps      Shoulder Exercises: Pulleys   Flexion  1 minute      Moist Heat Therapy   Number Minutes Moist Heat  12 Minutes    Moist Heat Location  Cervical      Traction   Type of Traction  Cervical    Min (lbs)  5    Max (lbs)  14    Hold Time  60    Rest Time  15    Time  12      Manual Therapy   Manual therapy comments  PA glides C1 to C7 central and lateral RT and LT Gr 3-4     Soft tissue mobilization  cervical paraspinals, SCM    Passive ROM  Side bend,  rotation and rotation, levator stretch     Manual Traction  sub-occipital release; manual traction              PT Education - 11/10/17 1354    Education provided  Yes    Education Details  HEP    Person(s) Educated  Patient    Methods  Explanation;Handout    Comprehension  Verbalized understanding       PT Short Term Goals - 11/11/17 1339      PT SHORT TERM GOAL #1   Title  He will be independent with initial HEP    Status  On-going      PT SHORT TERM GOAL #2   Title  He will report pain decreased 25% or more in neck    Status  On-going      PT SHORT TERM GOAL #3   Title  Cervical rotation will incr to  bilaterally to 45 degrees    Status  On-going        PT Long Term Goals - 10/27/17 1313      PT LONG TERM GOAL #1   Title  He will be independent with all HEP issued    Time  6    Period  Weeks    Status  New      PT LONG TERM GOAL #2   Title  He will report pain decreased 75% or more with   rotation to look for traffic    Time  6    Period  Weeks    Status  New      PT LONG TERM GOAL #3   Title  He will improve  to 50 degrees cervical rotation to allow normal looking to side and behind     Time  6    Period  Weeks    Status  New      PT LONG TERM GOAL #4   Title  He will be able to get out of bed without pain    Time  6    Period  Weeks    Status  New            Plan - 11/11/17 1257    Clinical Impression Statement  He is doing bette r with sleep and no pain at rest but report very high pain levels with movement that do not appear better. informed him that as time goes on we need some improvemnt in high pain and incr ROm to justify continued PT    PT Treatment/Interventions  Manual techniques;Dry needling;Moist Heat;Ultrasound;Traction;Passive range of motion;Patient/family education    PT Next Visit Plan  Modalities, manual, ROM,   traction if helpful,  snags , add bands     FOTO    PT Home Exercise Plan  postural exer, ROM, rotation and  extension snags with towel     Consulted and Agree with Plan of Care  Patient       Patient will benefit from skilled therapeutic intervention in order to improve the following deficits and impairments:  Decreased range of motion, Decreased activity tolerance, Postural dysfunction, Increased muscle spasms, Pain  Visit Diagnosis: Cervicalgia  Abnormal posture  Stiffness of cervical spine  Cramp and spasm     Problem List Patient Active Problem List   Diagnosis Date Noted  . HEARTBURN 05/09/2008  . ABDOMINAL BLOATING 05/09/2008  . HYPERTENSION 05/05/2008  . CONSTIPATION 05/05/2008  . ARTHRITIS 05/05/2008  . SLEEP APNEA 05/05/2008  . HERNIORRHAPHY, HX OF 05/05/2008    Darrel Hoover  PT 11/11/2017, 1:51 PM  [facility] [address] [place], [place], [address] Phone: [phone]   Fax:  [phone]  Name: [place] MRN: [id number] Date of Birth: 1934/03/19

## 2017-11-17 ENCOUNTER — Ambulatory Visit: Payer: Medicare Other | Admitting: Physical Therapy

## 2017-11-17 ENCOUNTER — Encounter: Payer: Self-pay | Admitting: Physical Therapy

## 2017-11-17 DIAGNOSIS — R293 Abnormal posture: Secondary | ICD-10-CM

## 2017-11-17 DIAGNOSIS — M436 Torticollis: Secondary | ICD-10-CM | POA: Diagnosis not present

## 2017-11-17 DIAGNOSIS — R252 Cramp and spasm: Secondary | ICD-10-CM

## 2017-11-17 DIAGNOSIS — M542 Cervicalgia: Secondary | ICD-10-CM

## 2017-11-17 NOTE — Therapy (Signed)
[place], [place], [address] Phone: [phone]   Fax:  [phone]  Physical Therapy Treatment  Patient Details  Name: Marvin West MRN: [id number] Date of Birth: 06-25-1934 Referring Provider: Lavone Orn, MD   Encounter Date: 11/17/2017  PT End of Session - 11/17/17 1312    Visit Number  7    Number of Visits  12    Date for PT Re-Evaluation  12/05/17    Authorization Type  MCR    PT Start Time  0107    PT Stop Time  0210    PT Time Calculation (min)  63 min       Past Medical History:  Diagnosis Date  . Arthritis   . Benign localized prostatic hyperplasia with lower urinary tract symptoms (LUTS)   . Chronic back pain   . CKD (chronic kidney disease), stage III ([place])   . Diverticulosis   . DJD (degenerative joint disease)   . ED (erectile dysfunction)   . GERD (gastroesophageal reflux disease)   . Gout    per last attack over a year 2016 approx.  . Hemorrhoids   . Hyperlipidemia   . Hypertension   . Hypogonadism in male   . OSA on CPAP   . Rectal bleeding   . Wears glasses     Past Surgical History:  Procedure Laterality Date  . ANAL FISTULOTOMY  07-04-2003     [facility]   and Internal Hemorrhoid Banding  . CATARACT EXTRACTION W/ INTRAOCULAR LENS  IMPLANT, BILATERAL  2013 approx.  . COLONOSCOPY WITH PROPOFOL N/A 07/27/2013   Procedure: COLONOSCOPY WITH PROPOFOL;  Surgeon: Garlan Fair, MD;  Location: [facility];  Service: Endoscopy;  Laterality: N/A;  . HEMORRHOID SURGERY N/A 10/02/2017   Procedure: HEMORRHOIDECTOMY, Anterior and Posterior;  Surgeon: Leighton Ruff, MD;  Location: [facility];  Service: General;  Laterality: N/A;  . INCISION AND DRAINAGE PERIRECTAL ABSCESS  03-22-2003     [facility]  . INGUINAL HERNIA REPAIR Right 08-14-2004    dr Ninfa Linden [facility]  . LUMBAR DISECTOMY  03/ 2015        [facility])  . NASAL SINUS SURGERY  07/29/2001  . THUMB ARTHROTOMY/  REMOVAL  MULTIPLE LOOSE BODIES/ SYNVECTOMY/  RESECTION MUCOID CYST Left 01-16-2010    dr sypher  [facility]  . TOE SURGERY Bilateral   . TRANSURETHRAL RESECTION OF PROSTATE  1983  . UVULOPALATOPHARYNGOPLASTY  1993    There were no vitals filed for this visit.  Subjective Assessment - 11/17/17 1310    Subjective  I think the neck is getting a little better. It doesnt hurt as much when I turn it.     Currently in Pain?  Yes    Pain Score  6  with movmement.     Pain Location  Neck    Pain Orientation  Left;Right;Posterior    Pain Descriptors / Indicators  Tightness    Pain Type  Chronic pain    Aggravating Factors   look up, rotate     Pain Relieving Factors  not moving, massage          [facility] PT Assessment - 11/17/17 0001      AROM   Cervical Flexion  30    Cervical Extension  15    Cervical - Right Side Bend  12    Cervical - Left Side Bend  14    Cervical - Right Rotation  25    Cervical -  Left Rotation  25                  [facility] Adult PT Treatment/Exercise - 11/17/17 0001      Neck Exercises: Theraband   Shoulder Extension  20 reps;Red    Rows  20 reps;Red      Neck Exercises: Seated   Other Seated Exercise  rotation snags with towel 2 x 15 sec each way cued to maintain pressure and place towel on cheek      Neck Exercises: Supine   Neck Retraction  10 reps      Traction   Min (lbs)  8    Max (lbs)  16    Hold Time  60    Rest Time  10    Time  12      Manual Therapy   Soft tissue mobilization  cervical paraspinals, upper traps     Passive ROM  Side bend, rotation and rotation, levator stretch     Manual Traction  sub-occipital release; manual traction              PT Education - 11/17/17 1357    Education provided  Yes    Education Details  HEP    Person(s) Educated  Patient    Methods  Explanation;Handout    Comprehension  Verbalized understanding       PT Short Term Goals - 11/17/17 1402      PT SHORT TERM GOAL #1   Title  He will be  independent with initial HEP    Time  2    Period  Weeks    Status  On-going      PT SHORT TERM GOAL #2   Title  He will report pain decreased 25% or more in neck    Baseline  sleeping better, no pain at rest     Time  3    Period  Weeks    Status  Achieved      PT SHORT TERM GOAL #3   Title  Cervical rotation will incr to  bilaterally to 45 degrees    Baseline  25-28    Time  3    Period  Weeks    Status  On-going        PT Long Term Goals - 10/27/17 1313      PT LONG TERM GOAL #1   Title  He will be independent with all HEP issued    Time  6    Period  Weeks    Status  New      PT LONG TERM GOAL #2   Title  He will report pain decreased 75% or more with   rotation to look for traffic    Time  6    Period  Weeks    Status  New      PT LONG TERM GOAL #3   Title  He will improve  to 50 degrees cervical rotation to allow normal looking to side and behind     Time  6    Period  Weeks    Status  New      PT LONG TERM GOAL #4   Title  He will be able to get out of bed without pain    Time  6    Period  Weeks    Status  New            Plan - 11/17/17 1357    Clinical  Impression Statement  He reports 25% improvement and 6-7/10 pain with turning head and looking up. STG# 2 met. Continued with neck snags for ROM, scap stab (added to HEP), manual and soft tissue work for ROM and cervical traction. He requires assist from his UE to lift head when moving supine/ sidelying to sit.     PT Next Visit Plan  Modalities try [place] ?, manual, ROM,   traction if helpful,  snags ,review bands     FOTO    PT Home Exercise Plan  postural exer, ROM, rotation and extension snags with towel     Consulted and Agree with Plan of Care  Patient       Patient will benefit from skilled therapeutic intervention in order to improve the following deficits and impairments:  Decreased range of motion, Decreased activity tolerance, Postural dysfunction, Increased muscle spasms, Pain  Visit  Diagnosis: Cervicalgia  Abnormal posture  Stiffness of cervical spine  Cramp and spasm     Problem List Patient Active Problem List   Diagnosis Date Noted  . HEARTBURN 05/09/2008  . ABDOMINAL BLOATING 05/09/2008  . HYPERTENSION 05/05/2008  . CONSTIPATION 05/05/2008  . ARTHRITIS 05/05/2008  . SLEEP APNEA 05/05/2008  . HERNIORRHAPHY, HX OF 05/05/2008    Dorene Ar, PTA 11/17/2017, 2:13 PM  [facility] [address] [place], [place], [address] Phone: [phone]   Fax:  [phone]  Name: COLLIER BOHNET MRN: [id number] Date of Birth: 06-17-1934

## 2017-11-17 NOTE — Patient Instructions (Signed)
  Resistive Band Rowing   With resistive band anchored in door, grasp both ends. Keeping elbows bent, pull back, squeezing shoulder blades together. Hold __5__ seconds. Repeat __20__ times. Do __2__ sessions per day.  EXTENSION: Standing - Resistance Band: Stable (Active)   Stand, right arm at side. Against yellow resistance band, draw arm backward, , keeping elbow straight.Squeeze shoulder blades . Hold 5 seconds  Complete _2__ sets of _10__ repetitions. Perform __2_ sessions per day.

## 2017-11-18 ENCOUNTER — Encounter: Payer: Self-pay | Admitting: Physical Therapy

## 2017-11-18 ENCOUNTER — Ambulatory Visit: Payer: Medicare Other | Admitting: Physical Therapy

## 2017-11-18 DIAGNOSIS — M436 Torticollis: Secondary | ICD-10-CM | POA: Diagnosis not present

## 2017-11-18 DIAGNOSIS — M542 Cervicalgia: Secondary | ICD-10-CM

## 2017-11-18 DIAGNOSIS — R252 Cramp and spasm: Secondary | ICD-10-CM | POA: Diagnosis not present

## 2017-11-18 DIAGNOSIS — R293 Abnormal posture: Secondary | ICD-10-CM | POA: Diagnosis not present

## 2017-11-18 NOTE — Therapy (Addendum)
[place], [place], [address] Phone: [phone]   Fax:  [phone]  Physical Therapy Treatment  Patient Details  Name: Marvin West MRN: [id number] Date of Birth: Dec 11, 1934 Referring Provider: Lavone Orn, MD   Encounter Date: 11/18/2017  PT End of Session - 11/18/17 1423    Visit Number  8    Number of Visits  12    Date for PT Re-Evaluation  12/05/17    Authorization Type  MCR    PT Start Time  0130    PT Stop Time  0245    PT Time Calculation (min)  75 min       Past Medical History:  Diagnosis Date  . Arthritis   . Benign localized prostatic hyperplasia with lower urinary tract symptoms (LUTS)   . Chronic back pain   . CKD (chronic kidney disease), stage III ([place])   . Diverticulosis   . DJD (degenerative joint disease)   . ED (erectile dysfunction)   . GERD (gastroesophageal reflux disease)   . Gout    per last attack over a year 2016 approx.  . Hemorrhoids   . Hyperlipidemia   . Hypertension   . Hypogonadism in male   . OSA on CPAP   . Rectal bleeding   . Wears glasses     Past Surgical History:  Procedure Laterality Date  . ANAL FISTULOTOMY  07-04-2003     [facility]   and Internal Hemorrhoid Banding  . CATARACT EXTRACTION W/ INTRAOCULAR LENS  IMPLANT, BILATERAL  2013 approx.  . COLONOSCOPY WITH PROPOFOL N/A 07/27/2013   Procedure: COLONOSCOPY WITH PROPOFOL;  Surgeon: Garlan Fair, MD;  [facility];  Service: Endoscopy;  Laterality: N/A;  . HEMORRHOID SURGERY N/A 10/02/2017   Procedure: HEMORRHOIDECTOMY, Anterior and Posterior;  Surgeon: Leighton Ruff, MD;  Location: [facility];  Service: General;  Laterality: N/A;  . INCISION AND DRAINAGE PERIRECTAL ABSCESS  03-22-2003     [facility]  . INGUINAL HERNIA REPAIR Right 08-14-2004    dr Ninfa Linden [facility]  . LUMBAR DISECTOMY  03/ 2015        [facility])  . NASAL SINUS SURGERY  07/29/2001  . THUMB ARTHROTOMY/  REMOVAL  MULTIPLE LOOSE BODIES/ SYNVECTOMY/  RESECTION MUCOID CYST Left 01-16-2010    dr sypher  [facility]  . TOE SURGERY Bilateral   . TRANSURETHRAL RESECTION OF PROSTATE  1983  . UVULOPALATOPHARYNGOPLASTY  1993    There were no vitals filed for this visit.  Subjective Assessment - 11/18/17 1422    Subjective  Doesnt hurt as much when I turn my head. Getting a little better.     Currently in Pain?  Yes    Pain Score  6     Pain Location  Neck    Pain Orientation  Left;Right;Posterior    Pain Descriptors / Indicators  Tightness         [facility] PT Assessment - 11/18/17 0001      Observation/Other Assessments   Focus on Therapeutic Outcomes (FOTO)   56% limited improved from 64% limited.                   [facility] Adult PT Treatment/Exercise - 11/18/17 0001      Neck Exercises: Theraband   Shoulder Extension  20 reps;Red    Rows  20 reps;Red      Modalities   Modalities  Ultrasound      Moist Heat Therapy  Number Minutes Moist Heat  15 Minutes    Moist Heat Location  Cervical      Ultrasound   Ultrasound Location  Bialteral cervical paraspinals and upper traps     Ultrasound Parameters  100% 3.3 mhz, 1.4 w/cm2       Traction   Min (lbs)  7    Max (lbs)  15    Hold Time  60    Rest Time  10    Time  12      Manual Therapy   Soft tissue mobilization  cervical paraspinals, upper traps              PT Education - 11/17/17 1357    Education provided  Yes    Education Details  HEP    Person(s) Educated  Patient    Methods  Explanation;Handout    Comprehension  Verbalized understanding       PT Short Term Goals - 11/17/17 1402      PT SHORT TERM GOAL #1   Title  He will be independent with initial HEP    Time  2    Period  Weeks    Status  On-going      PT SHORT TERM GOAL #2   Title  He will report pain decreased 25% or more in neck    Baseline  sleeping better, no pain at rest     Time  3    Period  Weeks    Status  Achieved      PT SHORT TERM GOAL  #3   Title  Cervical rotation will incr to  bilaterally to 45 degrees    Baseline  25-28    Time  3    Period  Weeks    Status  On-going        PT Long Term Goals - 10/27/17 1313      PT LONG TERM GOAL #1   Title  He will be independent with all HEP issued    Time  6    Period  Weeks    Status  New      PT LONG TERM GOAL #2   Title  He will report pain decreased 75% or more with   rotation to look for traffic    Time  6    Period  Weeks    Status  New      PT LONG TERM GOAL #3   Title  He will improve  to 50 degrees cervical rotation to allow normal looking to side and behind     Time  6    Period  Weeks    Status  New      PT LONG TERM GOAL #4   Title  He will be able to get out of bed without pain    Time  6    Period  Weeks    Status  New       G code:             Clinical judgement    PT other :   Current CL    Goal CK  Addended by Pearson Forster 12/04/17 3:34 PM   Plan - 11/18/17 1423    Clinical Impression Statement  Focused soft tissue work and Trigger point release to bilateral upper traps and cervical paraspinals in sitting. Also, used heated ultrasound for tissue flexibility to same area. Repeated mechanical traction per pt request. Also HMP per pt request. FOTO  score improved.     PT Next Visit Plan  assess benefit of [place], continue traction if helpful, snags, bands     PT Home Exercise Plan  postural exer, ROM, rotation and extension snags with towel     Consulted and Agree with Plan of Care  Patient       Patient will benefit from skilled therapeutic intervention in order to improve the following deficits and impairments:  Decreased range of motion, Decreased activity tolerance, Postural dysfunction, Increased muscle spasms, Pain  Visit Diagnosis: Cervicalgia  Abnormal posture  Stiffness of cervical spine  Cramp and spasm     Problem List Patient Active Problem List   Diagnosis Date Noted  . HEARTBURN 05/09/2008  . ABDOMINAL BLOATING  05/09/2008  . HYPERTENSION 05/05/2008  . CONSTIPATION 05/05/2008  . ARTHRITIS 05/05/2008  . SLEEP APNEA 05/05/2008  . HERNIORRHAPHY, HX OF 05/05/2008    Dorene Ar, PTA 11/18/2017, 2:59 PM  [facility] [address] [place], [place], [address] Phone: [phone]   Fax:  [phone]  Name: SZYMON FOILES MRN: [id number] Date of Birth: 1934-06-06

## 2017-11-25 ENCOUNTER — Encounter: Payer: Self-pay | Admitting: Physical Therapy

## 2017-11-25 ENCOUNTER — Ambulatory Visit: Payer: Medicare Other | Attending: Internal Medicine | Admitting: Physical Therapy

## 2017-11-25 DIAGNOSIS — N183 Chronic kidney disease, stage 3 (moderate): Secondary | ICD-10-CM | POA: Diagnosis not present

## 2017-11-25 DIAGNOSIS — R252 Cramp and spasm: Secondary | ICD-10-CM | POA: Insufficient documentation

## 2017-11-25 DIAGNOSIS — M542 Cervicalgia: Secondary | ICD-10-CM | POA: Insufficient documentation

## 2017-11-25 DIAGNOSIS — M109 Gout, unspecified: Secondary | ICD-10-CM | POA: Diagnosis not present

## 2017-11-25 DIAGNOSIS — M436 Torticollis: Secondary | ICD-10-CM | POA: Diagnosis not present

## 2017-11-25 DIAGNOSIS — R8290 Unspecified abnormal findings in urine: Secondary | ICD-10-CM | POA: Diagnosis not present

## 2017-11-25 DIAGNOSIS — R293 Abnormal posture: Secondary | ICD-10-CM | POA: Insufficient documentation

## 2017-11-25 DIAGNOSIS — N400 Enlarged prostate without lower urinary tract symptoms: Secondary | ICD-10-CM | POA: Diagnosis not present

## 2017-11-25 DIAGNOSIS — R42 Dizziness and giddiness: Secondary | ICD-10-CM | POA: Diagnosis not present

## 2017-11-25 DIAGNOSIS — E291 Testicular hypofunction: Secondary | ICD-10-CM | POA: Diagnosis not present

## 2017-11-25 DIAGNOSIS — E7800 Pure hypercholesterolemia, unspecified: Secondary | ICD-10-CM | POA: Diagnosis not present

## 2017-11-25 NOTE — Therapy (Signed)
[place], [place], [address] Phone: [phone]   Fax:  [phone]  Physical Therapy Treatment  Patient Details  Name: Marvin West MRN: [id number] Date of Birth: April 16, 1934 Referring Provider: Lavone Orn, MD   Encounter Date: 11/25/2017  PT End of Session - 11/25/17 1425    Visit Number  9    Number of Visits  12    Date for PT Re-Evaluation  12/05/17    Authorization Type  MCR    PT Start Time  1100    PT Stop Time  1200    PT Time Calculation (min)  60 min       Past Medical History:  Diagnosis Date  . Arthritis   . Benign localized prostatic hyperplasia with lower urinary tract symptoms (LUTS)   . Chronic back pain   . CKD (chronic kidney disease), stage III ([place])   . Diverticulosis   . DJD (degenerative joint disease)   . ED (erectile dysfunction)   . GERD (gastroesophageal reflux disease)   . Gout    per last attack over a year 2016 approx.  . Hemorrhoids   . Hyperlipidemia   . Hypertension   . Hypogonadism in male   . OSA on CPAP   . Rectal bleeding   . Wears glasses     Past Surgical History:  Procedure Laterality Date  . ANAL FISTULOTOMY  07-04-2003     [facility]   and Internal Hemorrhoid Banding  . CATARACT EXTRACTION W/ INTRAOCULAR LENS  IMPLANT, BILATERAL  2013 approx.  . COLONOSCOPY WITH PROPOFOL N/A 07/27/2013   Procedure: COLONOSCOPY WITH PROPOFOL;  Surgeon: Garlan Fair, MD;  [facility];  Service: Endoscopy;  Laterality: N/A;  . HEMORRHOID SURGERY N/A 10/02/2017   Procedure: HEMORRHOIDECTOMY, Anterior and Posterior;  Surgeon: Leighton Ruff, MD;  Location: [facility];  Service: General;  Laterality: N/A;  . INCISION AND DRAINAGE PERIRECTAL ABSCESS  03-22-2003     [facility]  . INGUINAL HERNIA REPAIR Right 08-14-2004    dr Ninfa Linden [facility]  . LUMBAR DISECTOMY  03/ 2015        [facility])  . NASAL SINUS SURGERY  07/29/2001  . THUMB ARTHROTOMY/  REMOVAL  MULTIPLE LOOSE BODIES/ SYNVECTOMY/  RESECTION MUCOID CYST Left 01-16-2010    dr sypher  [facility]  . TOE SURGERY Bilateral   . TRANSURETHRAL RESECTION OF PROSTATE  1983  . UVULOPALATOPHARYNGOPLASTY  1993    There were no vitals filed for this visit.      [facility] PT Assessment - 11/25/17 0001      AROM   Cervical - Right Rotation  30    Cervical - Left Rotation  25                  [facility] Adult PT Treatment/Exercise - 11/25/17 0001      Neck Exercises: Theraband   Horizontal ABduction  10 reps    Other Theraband Exercises  shooulder ER x 10 yellow       Neck Exercises: Seated   Other Seated Exercise  Seated  cervical AROM, seated trunk rotations, upper, with opposite cervical rotation, 4 finger chin to chest with cervical rotations       Neck Exercises: Supine   Neck Retraction  10 reps      Moist Heat Therapy   Number Minutes Moist Heat  15 Minutes    Moist Heat Location  Cervical      Manual Therapy  Soft tissue mobilization  cervical paraspinals, upper traps     Passive ROM  Side bend, rotation and rotation, levator stretch                PT Short Term Goals - 11/17/17 1402      PT SHORT TERM GOAL #1   Title  He will be independent with initial HEP    Time  2    Period  Weeks    Status  On-going      PT SHORT TERM GOAL #2   Title  He will report pain decreased 25% or more in neck    Baseline  sleeping better, no pain at rest     Time  3    Period  Weeks    Status  Achieved      PT SHORT TERM GOAL #3   Title  Cervical rotation will incr to  bilaterally to 45 degrees    Baseline  25-28    Time  3    Period  Weeks    Status  On-going        PT Long Term Goals - 10/27/17 1313      PT LONG TERM GOAL #1   Title  He will be independent with all HEP issued    Time  6    Period  Weeks    Status  New      PT LONG TERM GOAL #2   Title  He will report pain decreased 75% or more with   rotation to look for traffic    Time  6    Period   Weeks    Status  New      PT LONG TERM GOAL #3   Title  He will improve  to 50 degrees cervical rotation to allow normal looking to side and behind     Time  6    Period  Weeks    Status  New      PT LONG TERM GOAL #4   Title  He will be able to get out of bed without pain    Time  6    Period  Weeks    Status  New            Plan - 11/25/17 1447    Clinical Impression Statement  Some improvement in right cervical rotation. Continues to turn his whole body to look right and left.  Continued soft tissue work and manual stretching to improved ROM and decreased pain. [place] following to decrease soreness.     PT Next Visit Plan   snags, bands AROM, manual     PT Home Exercise Plan  postural exer, ROM, rotation and extension snags with towel     Consulted and Agree with Plan of Care  Patient       Patient will benefit from skilled therapeutic intervention in order to improve the following deficits and impairments:  Decreased range of motion, Decreased activity tolerance, Postural dysfunction, Increased muscle spasms, Pain  Visit Diagnosis: Cervicalgia  Stiffness of cervical spine  Abnormal posture  Cramp and spasm     Problem List Patient Active Problem List   Diagnosis Date Noted  . HEARTBURN 05/09/2008  . ABDOMINAL BLOATING 05/09/2008  . HYPERTENSION 05/05/2008  . CONSTIPATION 05/05/2008  . ARTHRITIS 05/05/2008  . SLEEP APNEA 05/05/2008  . HERNIORRHAPHY, HX OF 05/05/2008    Dorene Ar, PTA 11/25/2017, 2:50 PM  [facility] [address]  [place], [place], [address] Phone: [phone]   Fax:  [phone]  Name: Marvin West MRN: [id number] Date of Birth: 10/27/1934

## 2017-11-28 ENCOUNTER — Ambulatory Visit: Payer: Medicare Other | Admitting: Physical Therapy

## 2017-11-28 ENCOUNTER — Encounter: Payer: Self-pay | Admitting: Physical Therapy

## 2017-11-28 DIAGNOSIS — M542 Cervicalgia: Secondary | ICD-10-CM | POA: Diagnosis not present

## 2017-11-28 DIAGNOSIS — R293 Abnormal posture: Secondary | ICD-10-CM | POA: Diagnosis not present

## 2017-11-28 DIAGNOSIS — R252 Cramp and spasm: Secondary | ICD-10-CM | POA: Diagnosis not present

## 2017-11-28 DIAGNOSIS — M436 Torticollis: Secondary | ICD-10-CM

## 2017-11-28 NOTE — Therapy (Signed)
[place], [place], [address] Phone: [phone]   Fax:  [phone]  Physical Therapy Treatment  Patient Details  Name: Marvin West MRN: [id number] Date of Birth: 01/17/1934 Referring Provider: Lavone Orn, MD   Encounter Date: 11/28/2017  PT End of Session - 11/28/17 1038    Visit Number  10    Number of Visits  12    Date for PT Re-Evaluation  12/05/17    Authorization Type  MCR    PT Start Time  1015    PT Stop Time  1115    PT Time Calculation (min)  60 min       Past Medical History:  Diagnosis Date  . Arthritis   . Benign localized prostatic hyperplasia with lower urinary tract symptoms (LUTS)   . Chronic back pain   . CKD (chronic kidney disease), stage III ([place])   . Diverticulosis   . DJD (degenerative joint disease)   . ED (erectile dysfunction)   . GERD (gastroesophageal reflux disease)   . Gout    per last attack over a year 2016 approx.  . Hemorrhoids   . Hyperlipidemia   . Hypertension   . Hypogonadism in male   . OSA on CPAP   . Rectal bleeding   . Wears glasses     Past Surgical History:  Procedure Laterality Date  . ANAL FISTULOTOMY  07-04-2003     [facility]   and Internal Hemorrhoid Banding  . CATARACT EXTRACTION W/ INTRAOCULAR LENS  IMPLANT, BILATERAL  2013 approx.  . COLONOSCOPY WITH PROPOFOL N/A 07/27/2013   Procedure: COLONOSCOPY WITH PROPOFOL;  Surgeon: Garlan Fair, MD;  [facility];  Service: Endoscopy;  Laterality: N/A;  . HEMORRHOID SURGERY N/A 10/02/2017   Procedure: HEMORRHOIDECTOMY, Anterior and Posterior;  Surgeon: Leighton Ruff, MD;  Location: [facility];  Service: General;  Laterality: N/A;  . INCISION AND DRAINAGE PERIRECTAL ABSCESS  03-22-2003     [facility]  . INGUINAL HERNIA REPAIR Right 08-14-2004    dr Ninfa Linden [facility]  . LUMBAR DISECTOMY  03/ 2015        [facility])  . NASAL SINUS SURGERY  07/29/2001  . THUMB ARTHROTOMY/  REMOVAL  MULTIPLE LOOSE BODIES/ SYNVECTOMY/  RESECTION MUCOID CYST Left 01-16-2010    dr sypher  [facility]  . TOE SURGERY Bilateral   . TRANSURETHRAL RESECTION OF PROSTATE  1983  . UVULOPALATOPHARYNGOPLASTY  1993    There were no vitals filed for this visit.  Subjective Assessment - 11/28/17 1030    Subjective  I think it will help if I do the exercises more.                       [facility] Adult PT Treatment/Exercise - 11/28/17 0001      Neck Exercises: Theraband   Rows  20 reps;Green    Horizontal ABduction  10 reps yellow    Other Theraband Exercises  shooulder ER x 10 yellow       Neck Exercises: Seated   Other Seated Exercise  rotation snags with towel 3 x 15 sec each way , extension snags 15 sec x 3       Neck Exercises: Supine   Neck Retraction  10 reps      Moist Heat Therapy   Number Minutes Moist Heat  15 Minutes    Moist Heat Location  Cervical      Manual Therapy  Soft tissue mobilization  cervical paraspinals, upper traps  seated               PT Short Term Goals - 11/17/17 1402      PT SHORT TERM GOAL #1   Title  He will be independent with initial HEP    Time  2    Period  Weeks    Status  On-going      PT SHORT TERM GOAL #2   Title  He will report pain decreased 25% or more in neck    Baseline  sleeping better, no pain at rest     Time  3    Period  Weeks    Status  Achieved      PT SHORT TERM GOAL #3   Title  Cervical rotation will incr to  bilaterally to 45 degrees    Baseline  25-28    Time  3    Period  Weeks    Status  On-going        PT Long Term Goals - 10/27/17 1313      PT LONG TERM GOAL #1   Title  He will be independent with all HEP issued    Time  6    Period  Weeks    Status  New      PT LONG TERM GOAL #2   Title  He will report pain decreased 75% or more with   rotation to look for traffic    Time  6    Period  Weeks    Status  New      PT LONG TERM GOAL #3   Title  He will improve  to 50 degrees cervical  rotation to allow normal looking to side and behind     Time  6    Period  Weeks    Status  New      PT LONG TERM GOAL #4   Title  He will be able to get out of bed without pain    Time  6    Period  Weeks    Status  New            Plan - 11/28/17 1112    Clinical Impression Statement  Focused HEP as pt has yet to reports consistency on his own. Continued soft tissue work and Moist heat to decrease pain and improve ROM. Pt still hesitant to try TPDN. He may have reached is max potental with PT.     PT Next Visit Plan   snags, bands AROM, manual , possible DC if no further progress and does not want TPDN.     PT Home Exercise Plan  postural exer, ROM, rotation and extension snags with towel     Consulted and Agree with Plan of Care  Patient       Patient will benefit from skilled therapeutic intervention in order to improve the following deficits and impairments:  Decreased range of motion, Decreased activity tolerance, Postural dysfunction, Increased muscle spasms, Pain  Visit Diagnosis: Cervicalgia  Stiffness of cervical spine  Abnormal posture  Cramp and spasm     Problem List Patient Active Problem List   Diagnosis Date Noted  . HEARTBURN 05/09/2008  . ABDOMINAL BLOATING 05/09/2008  . HYPERTENSION 05/05/2008  . CONSTIPATION 05/05/2008  . ARTHRITIS 05/05/2008  . SLEEP APNEA 05/05/2008  . HERNIORRHAPHY, HX OF 05/05/2008    Dorene Ar, PTA 11/28/2017, 11:15 AM  [facility] [address] [place], [place], [address] Phone: [phone]   Fax:  [phone]  Name: Marvin West MRN: [id number] Date of Birth: 1934-02-03

## 2017-12-03 ENCOUNTER — Ambulatory Visit: Payer: Medicare Other | Admitting: Physical Therapy

## 2017-12-03 ENCOUNTER — Encounter: Payer: Self-pay | Admitting: Physical Therapy

## 2017-12-03 DIAGNOSIS — R252 Cramp and spasm: Secondary | ICD-10-CM

## 2017-12-03 DIAGNOSIS — M436 Torticollis: Secondary | ICD-10-CM | POA: Diagnosis not present

## 2017-12-03 DIAGNOSIS — M542 Cervicalgia: Secondary | ICD-10-CM | POA: Diagnosis not present

## 2017-12-03 DIAGNOSIS — R293 Abnormal posture: Secondary | ICD-10-CM

## 2017-12-03 NOTE — Therapy (Signed)
[place], [place], [address] Phone: [phone]   Fax:  [phone]  Physical Therapy Treatment  Patient Details  Name: Marvin West MRN: [id number] Date of Birth: [birth year] Referring Provider: Lavone Orn, MD   Encounter Date: 12/03/2017  PT End of Session - 12/03/17 1540    Visit Number  11    Number of Visits  12    Date for PT Re-Evaluation  12/05/17    Authorization Type  MCR    PT Start Time  0230    PT Stop Time  0330    PT Time Calculation (min)  60 min       Past Medical History:  Diagnosis Date  . Arthritis   . Benign localized prostatic hyperplasia with lower urinary tract symptoms (LUTS)   . Chronic back pain   . CKD (chronic kidney disease), stage III ([place])   . Diverticulosis   . DJD (degenerative joint disease)   . ED (erectile dysfunction)   . GERD (gastroesophageal reflux disease)   . Gout    per last attack over a year 2016 approx.  . Hemorrhoids   . Hyperlipidemia   . Hypertension   . Hypogonadism in male   . OSA on CPAP   . Rectal bleeding   . Wears glasses     Past Surgical History:  Procedure Laterality Date  . ANAL FISTULOTOMY  07-04-2003     [facility]   and Internal Hemorrhoid Banding  . CATARACT EXTRACTION W/ INTRAOCULAR LENS  IMPLANT, BILATERAL  2013 approx.  . COLONOSCOPY WITH PROPOFOL N/A 07/27/2013   Procedure: COLONOSCOPY WITH PROPOFOL;  Surgeon: Garlan Fair, MD;  Location: [facility];  Service: Endoscopy;  Laterality: N/A;  . HEMORRHOID SURGERY N/A 10/02/2017   Procedure: HEMORRHOIDECTOMY, Anterior and Posterior;  Surgeon: Leighton Ruff, MD;  Location: [facility];  Service: General;  Laterality: N/A;  . INCISION AND DRAINAGE PERIRECTAL ABSCESS  03-22-2003     [facility]  . INGUINAL HERNIA REPAIR Right 08-14-2004    dr Ninfa Linden [facility]  . LUMBAR DISECTOMY  03/ 2015        [facility])  . NASAL SINUS SURGERY  07/29/2001  . THUMB ARTHROTOMY/   REMOVAL MULTIPLE LOOSE BODIES/ SYNVECTOMY/  RESECTION MUCOID CYST Left 01-16-2010    dr sypher  [facility]  . TOE SURGERY Bilateral   . TRANSURETHRAL RESECTION OF PROSTATE  1983  . UVULOPALATOPHARYNGOPLASTY  1993    There were no vitals filed for this visit.  Subjective Assessment - 12/03/17 1436    Subjective  not pain at rest     Currently in Pain?  Yes    Pain Score  5     Pain Location  Neck                      [facility] Adult PT Treatment/Exercise - 12/03/17 0001      Neck Exercises: Theraband   Horizontal ABduction  15 reps yellow    Other Theraband Exercises  [place] ER x 15 yellow       Neck Exercises: Seated   Other Seated Exercise  Seated isometrics 5 sec x 5 all planes     Other Seated Exercise  seated cervical rotaiton AROM      Neck Exercises: Supine   Neck Retraction  10 reps    Other Supine Exercise  supine horizontal abduction, ER,, pulllovers wtih yellow band       Moist Heat  Therapy   Number Minutes Moist Heat  15 Minutes    Moist Heat Location  Cervical      Manual Therapy   Soft tissue mobilization  cervical paraspinals, upper traps  supine    Passive ROM  Side bend, rotation and rotation, levator stretch                PT Short Term Goals - 11/17/17 1402      PT SHORT TERM GOAL #1   Title  He will be independent with initial HEP    Time  2    Period  Weeks    Status  On-going      PT SHORT TERM GOAL #2   Title  He will report pain decreased 25% or more in neck    Baseline  sleeping better, no pain at rest     Time  3    Period  Weeks    Status  Achieved      PT SHORT TERM GOAL #3   Title  Cervical rotation will incr to  bilaterally to 45 degrees    Baseline  25-28    Time  3    Period  Weeks    Status  On-going        PT Long Term Goals - 10/27/17 1313      PT LONG TERM GOAL #1   Title  He will be independent with all HEP issued    Time  6    Period  Weeks    Status  New      PT LONG TERM GOAL #2   Title  He  will report pain decreased 75% or more with   rotation to look for traffic    Time  6    Period  Weeks    Status  New      PT LONG TERM GOAL #3   Title  He will improve  to 50 degrees cervical rotation to allow normal looking to side and behind     Time  6    Period  Weeks    Status  New      PT LONG TERM GOAL #4   Title  He will be able to get out of bed without pain    Time  6    Period  Weeks    Status  New            Plan - 12/03/17 1542    Clinical Impression Statement  Pt reports more consistency with HEP. A little less pain with motion. Continues to need UE assist to assist head during transitions. Added isometrcis to HEP, encourged him to be gentle. Repeated soft tissue work to parapsinals. Pt is agreeable to TPDN at this time although he is reluctant and fearful of needles.     PT Next Visit Plan  ERO for TPDN or discharge. Review isometrics , upper trunk rotation    PT Home Exercise Plan  postural exer, ROM, rotation and extension snags with towel , isometrics neck     Consulted and Agree with Plan of Care  Patient       Patient will benefit from skilled therapeutic intervention in order to improve the following deficits and impairments:  Decreased range of motion, Decreased activity tolerance, Postural dysfunction, Increased muscle spasms, Pain  Visit Diagnosis: Cervicalgia  Stiffness of cervical spine  Abnormal posture  Cramp and spasm     Problem List Patient Active Problem List  Diagnosis Date Noted  . HEARTBURN 05/09/2008  . ABDOMINAL BLOATING 05/09/2008  . HYPERTENSION 05/05/2008  . CONSTIPATION 05/05/2008  . ARTHRITIS 05/05/2008  . SLEEP APNEA 05/05/2008  . HERNIORRHAPHY, HX OF 05/05/2008    Dorene Ar, PTA 12/03/2017, 3:48 PM  [place], [place], [address] Phone: [phone]   Fax:  [phone]  Name: Marvin West MRN: [id number] Date of Birth:  1934/08/15

## 2017-12-05 ENCOUNTER — Encounter: Payer: 59 | Admitting: Physical Therapy

## 2017-12-10 ENCOUNTER — Ambulatory Visit: Payer: Medicare Other | Admitting: Physical Therapy

## 2017-12-12 DIAGNOSIS — E7800 Pure hypercholesterolemia, unspecified: Secondary | ICD-10-CM | POA: Diagnosis not present

## 2017-12-12 DIAGNOSIS — G4733 Obstructive sleep apnea (adult) (pediatric): Secondary | ICD-10-CM | POA: Diagnosis not present

## 2017-12-12 DIAGNOSIS — I10 Essential (primary) hypertension: Secondary | ICD-10-CM | POA: Diagnosis not present

## 2017-12-12 DIAGNOSIS — I129 Hypertensive chronic kidney disease with stage 1 through stage 4 chronic kidney disease, or unspecified chronic kidney disease: Secondary | ICD-10-CM | POA: Diagnosis not present

## 2017-12-12 DIAGNOSIS — Z0000 Encounter for general adult medical examination without abnormal findings: Secondary | ICD-10-CM | POA: Diagnosis not present

## 2017-12-12 DIAGNOSIS — N183 Chronic kidney disease, stage 3 (moderate): Secondary | ICD-10-CM | POA: Diagnosis not present

## 2017-12-12 DIAGNOSIS — Z7189 Other specified counseling: Secondary | ICD-10-CM | POA: Diagnosis not present

## 2017-12-12 DIAGNOSIS — N400 Enlarged prostate without lower urinary tract symptoms: Secondary | ICD-10-CM | POA: Diagnosis not present

## 2017-12-12 DIAGNOSIS — E291 Testicular hypofunction: Secondary | ICD-10-CM | POA: Diagnosis not present

## 2017-12-12 DIAGNOSIS — Z23 Encounter for immunization: Secondary | ICD-10-CM | POA: Diagnosis not present

## 2017-12-12 DIAGNOSIS — Z1389 Encounter for screening for other disorder: Secondary | ICD-10-CM | POA: Diagnosis not present

## 2017-12-19 DIAGNOSIS — E291 Testicular hypofunction: Secondary | ICD-10-CM | POA: Diagnosis not present

## 2017-12-19 DIAGNOSIS — Z125 Encounter for screening for malignant neoplasm of prostate: Secondary | ICD-10-CM | POA: Diagnosis not present

## 2017-12-24 ENCOUNTER — Ambulatory Visit: Payer: Medicare Other

## 2017-12-30 ENCOUNTER — Ambulatory Visit: Payer: Medicare Other | Attending: Internal Medicine

## 2017-12-30 ENCOUNTER — Encounter: Payer: Self-pay | Admitting: Physical Therapy

## 2017-12-30 DIAGNOSIS — R293 Abnormal posture: Secondary | ICD-10-CM | POA: Insufficient documentation

## 2017-12-30 DIAGNOSIS — M436 Torticollis: Secondary | ICD-10-CM | POA: Insufficient documentation

## 2017-12-30 DIAGNOSIS — R252 Cramp and spasm: Secondary | ICD-10-CM

## 2017-12-30 DIAGNOSIS — E291 Testicular hypofunction: Secondary | ICD-10-CM | POA: Diagnosis not present

## 2017-12-30 DIAGNOSIS — M542 Cervicalgia: Secondary | ICD-10-CM | POA: Insufficient documentation

## 2017-12-30 NOTE — Therapy (Signed)
[place], [place], [address] Phone: [phone]   Fax:  [phone]  Physical Therapy Treatment  Patient Details  Name: Marvin West MRN: [id number] Date of Birth: 1934/07/29 Referring Provider: Lavone Orn, MD   Encounter Date: 12/30/2017  PT End of Session - 12/30/17 1227    Visit Number  12    Number of Visits  1216    Date for PT Re-Evaluation  01/30/18    Authorization Type  MCR    PT Start Time  1145    PT Stop Time  1236    PT Time Calculation (min)  51 min    Activity Tolerance  Patient tolerated treatment well;Patient limited by pain    Behavior During Therapy  [facility] for tasks assessed/performed       Past Medical History:  Diagnosis Date  . Arthritis   . Benign localized prostatic hyperplasia with lower urinary tract symptoms (LUTS)   . Chronic back pain   . CKD (chronic kidney disease), stage III ([place])   . Diverticulosis   . DJD (degenerative joint disease)   . ED (erectile dysfunction)   . GERD (gastroesophageal reflux disease)   . Gout    per last attack over a year 2016 approx.  . Hemorrhoids   . Hyperlipidemia   . Hypertension   . Hypogonadism in male   . OSA on CPAP   . Rectal bleeding   . Wears glasses     Past Surgical History:  Procedure Laterality Date  . ANAL FISTULOTOMY  07-04-2003     [facility]   and Internal Hemorrhoid Banding  . CATARACT EXTRACTION W/ INTRAOCULAR LENS  IMPLANT, BILATERAL  2013 approx.  . COLONOSCOPY WITH PROPOFOL N/A 07/27/2013   Procedure: COLONOSCOPY WITH PROPOFOL;  Surgeon: Garlan Fair, MD;  Location: [facility];  Service: Endoscopy;  Laterality: N/A;  . HEMORRHOID SURGERY N/A 10/02/2017   Procedure: HEMORRHOIDECTOMY, Anterior and Posterior;  Surgeon: Leighton Ruff, MD;  Location: [facility];  Service: General;  Laterality: N/A;  . INCISION AND DRAINAGE PERIRECTAL ABSCESS  03-22-2003     [facility]  . INGUINAL HERNIA REPAIR Right  08-14-2004    dr Ninfa Linden [facility]  . LUMBAR DISECTOMY  03/ 2015        [facility])  . NASAL SINUS SURGERY  07/29/2001  . THUMB ARTHROTOMY/  REMOVAL MULTIPLE LOOSE BODIES/ SYNVECTOMY/  RESECTION MUCOID CYST Left 01-16-2010    dr sypher  [facility]  . TOE SURGERY Bilateral   . TRANSURETHRAL RESECTION OF PROSTATE  1983  . UVULOPALATOPHARYNGOPLASTY  1993    There were no vitals filed for this visit.  Subjective Assessment - 12/30/17 1148    Subjective  not pain at rest . Pain on turning Pain just in neck now    Currently in Pain?  Yes    Pain Score  7  with movement    Pain Location  Neck    Pain Orientation  Right;Left;Posterior    Pain Descriptors / Indicators  Tightness;Aching    Pain Type  Chronic pain    Pain Onset  More than a month ago    Pain Frequency  Intermittent    Aggravating Factors   lokking up and rotating neck    Pain Relieving Factors  rest massage         [facility] PT Assessment - 12/30/17 0001      AROM   Cervical Flexion  30    Cervical Extension  15    Cervical - Right Side Bend  12    Cervical - Left Side Bend  14    Cervical - Right Rotation  25    Cervical - Left Rotation  23                  [facility] Adult PT Treatment/Exercise - 12/30/17 0001      Neck Exercises: Seated   Other Seated Exercise  PT  Demoed isometrics       Moist Heat Therapy   Number Minutes Moist Heat  15 Minutes    Moist Heat Location  Cervical      Manual Therapy   Soft tissue mobilization  cervical paraspinals, upper traps  with tool             PT Education - 12/30/17 1226    Education provided  Yes    Education Details  Discussed lack of ROM improvement and progress in general    Person(s) Educated  Patient    Methods  Explanation    Comprehension  Verbalized understanding       PT Short Term Goals - 12/30/17 1230      PT SHORT TERM GOAL #1   Title  He will be independent with initial HEP    Status  Achieved      PT SHORT TERM GOAL #2   Title  He will  report pain decreased 25% or more in neck    Status  Achieved      PT SHORT TERM GOAL #3   Title  Cervical rotation will incr to  bilaterally to 45 degrees    Baseline  no change    Status  On-going        PT Long Term Goals - 12/30/17 1230      PT LONG TERM GOAL #1   Title  He will be independent with all HEP issued    Status  On-going      PT LONG TERM GOAL #2   Title  He will report pain decreased 75% or more with   rotation to look for traffic    Baseline  pain in neck now but not in traps and shoulders    Status  Partially Met      PT LONG TERM GOAL #3   Title  He will improve  to 50 degrees cervical rotation to allow normal looking to side and behind     Status  Not Met      PT LONG TERM GOAL #4   Title  He will be able to get out of bed without pain    Status  On-going            Plan - 12/30/17 1228    Clinical Impression Statement  Less pain uin traps ans shoulders but continued pain inparaspinals and reported deeper pain and pain sharp at times.   Dry needling is the last option to see if paraspinals can decr spasm and pain and make movement more comfortable  then discharge if no better    PT Frequency  2x / week    PT Duration  2 weeks over 4 weeks as will be out of town    PT Treatment/Interventions  Manual techniques;Dry needling;Moist Heat;Ultrasound;Traction;Passive range of motion;Patient/family education    PT Next Visit Plan  Dry needling    PT Home Exercise Plan  postural exer, ROM, rotation and extension snags with towel , isometrics neck  Consulted and Agree with Plan of Care  Patient       Patient will benefit from skilled therapeutic intervention in order to improve the following deficits and impairments:  Decreased range of motion, Decreased activity tolerance, Postural dysfunction, Increased muscle spasms, Pain  Visit Diagnosis: Cervicalgia  Stiffness of cervical spine  Abnormal posture  Cramp and spasm     Problem  List Patient Active Problem List   Diagnosis Date Noted  . HEARTBURN 05/09/2008  . ABDOMINAL BLOATING 05/09/2008  . HYPERTENSION 05/05/2008  . CONSTIPATION 05/05/2008  . ARTHRITIS 05/05/2008  . SLEEP APNEA 05/05/2008  . HERNIORRHAPHY, HX OF 05/05/2008    Darrel Hoover   PT 12/30/2017, 12:36 PM  [place] [facility] [address] [place], [place], [address] Phone: [phone]   Fax:  [phone]  Name: Marvin West MRN: [id number] Date of Birth: [birth year]

## 2018-01-01 ENCOUNTER — Ambulatory Visit: Payer: Medicare Other | Admitting: Physical Therapy

## 2018-01-01 ENCOUNTER — Encounter: Payer: Self-pay | Admitting: Physical Therapy

## 2018-01-01 DIAGNOSIS — R293 Abnormal posture: Secondary | ICD-10-CM

## 2018-01-01 DIAGNOSIS — M436 Torticollis: Secondary | ICD-10-CM

## 2018-01-01 DIAGNOSIS — M542 Cervicalgia: Secondary | ICD-10-CM | POA: Diagnosis not present

## 2018-01-01 DIAGNOSIS — R252 Cramp and spasm: Secondary | ICD-10-CM

## 2018-01-01 NOTE — Therapy (Addendum)
[place], [place], [address] Phone: [phone]   Fax:  [phone]  Physical Therapy Treatment  Patient Details  Name: Marvin West MRN: [id number] Date of Birth: October 09, 1934 Referring Provider: Lavone Orn, MD   Encounter Date: 01/01/2018  PT End of Session - 01/01/18 1447    Visit Number  13    Number of Visits  1216    Date for PT Re-Evaluation  01/30/18    Authorization Type  MCR    PT Start Time  1341 Patient 11 minutes late for appointment    PT Stop Time  1422 8 min for trigger point dry needling    PT Time Calculation (min)  41 min    Activity Tolerance  Patient tolerated treatment well;Patient limited by pain    Behavior During Therapy  [facility] for tasks assessed/performed       Past Medical History:  Diagnosis Date  . Arthritis   . Benign localized prostatic hyperplasia with lower urinary tract symptoms (LUTS)   . Chronic back pain   . CKD (chronic kidney disease), stage III ([place])   . Diverticulosis   . DJD (degenerative joint disease)   . ED (erectile dysfunction)   . GERD (gastroesophageal reflux disease)   . Gout    per last attack over a year 2016 approx.  . Hemorrhoids   . Hyperlipidemia   . Hypertension   . Hypogonadism in male   . OSA on CPAP   . Rectal bleeding   . Wears glasses     Past Surgical History:  Procedure Laterality Date  . ANAL FISTULOTOMY  07-04-2003     [facility]   and Internal Hemorrhoid Banding  . CATARACT EXTRACTION W/ INTRAOCULAR LENS  IMPLANT, BILATERAL  2013 approx.  . COLONOSCOPY WITH PROPOFOL N/A 07/27/2013   Procedure: COLONOSCOPY WITH PROPOFOL;  Surgeon: Garlan Fair, MD;  Location: [facility];  Service: Endoscopy;  Laterality: N/A;  . HEMORRHOID SURGERY N/A 10/02/2017   Procedure: HEMORRHOIDECTOMY, Anterior and Posterior;  Surgeon: Leighton Ruff, MD;  Location: [facility];  Service: General;  Laterality: N/A;  . INCISION AND  DRAINAGE PERIRECTAL ABSCESS  03-22-2003     [facility]  . INGUINAL HERNIA REPAIR Right 08-14-2004    dr Ninfa Linden [facility]  . LUMBAR DISECTOMY  03/ 2015        [facility])  . NASAL SINUS SURGERY  07/29/2001  . THUMB ARTHROTOMY/  REMOVAL MULTIPLE LOOSE BODIES/ SYNVECTOMY/  RESECTION MUCOID CYST Left 01-16-2010    dr sypher  [facility]  . TOE SURGERY Bilateral   . TRANSURETHRAL RESECTION OF PROSTATE  1983  . UVULOPALATOPHARYNGOPLASTY  1993    There were no vitals filed for this visit.  Subjective Assessment - 01/01/18 1343    Subjective  Patient reports his neck is still sore into his upper traps and his back is hurting him as well. He is willing to try dry needling.     Limitations  Lifting    Currently in Pain?  Yes    Pain Score  7     Pain Location  Neck    Pain Orientation  Right;Posterior    Pain Descriptors / Indicators  Aching;Tightness    Pain Onset  More than a month ago    Pain Frequency  Intermittent    Aggravating Factors   looking and rotating     Pain Relieving Factors  rest and massage     Multiple Pain Sites  No  [facility] Adult PT Treatment/Exercise - 01/01/18 0001      Manual Therapy   Soft tissue mobilization  IASTYM to cervical spine and upper traps to reduce spasming and post needle soreness.              PT Education - 01/01/18 1403    Education provided  Yes    Education Details  reviewed benefits and risks of TPDN    Person(s) Educated  Patient    Methods  Explanation;Demonstration;Tactile cues;Verbal cues;Handout    Comprehension  Verbalized understanding;Returned demonstration;Verbal cues required;Tactile cues required       PT Short Term Goals - 01/01/18 1451      PT SHORT TERM GOAL #1   Title  He will be independent with initial HEP    Time  2    Period  Weeks    Status  Achieved      PT SHORT TERM GOAL #2   Title  He will report pain decreased 25% or more in neck    Baseline  sleeping better, no pain at rest     Time   3    Period  Weeks    Status  Achieved      PT SHORT TERM GOAL #3   Title  Cervical rotation will incr to  bilaterally to 45 degrees    Baseline  no change    Time  3    Period  Weeks    Status  On-going        PT Long Term Goals - 12/30/17 1230      PT LONG TERM GOAL #1   Title  He will be independent with all HEP issued    Status  On-going      PT LONG TERM GOAL #2   Title  He will report pain decreased 75% or more with   rotation to look for traffic    Baseline  pain in neck now but not in traps and shoulders    Status  Partially Met      PT LONG TERM GOAL #3   Title  He will improve  to 50 degrees cervical rotation to allow normal looking to side and behind     Status  Not Met      PT LONG TERM GOAL #4   Title  He will be able to get out of bed without pain    Status  On-going            Plan - 01/01/18 1448    Clinical Impression Statement  Great twitch repsonse to bith upper traps and bilateral paraspinals arpound C3-C4. Therapy educated patient on how to reduce post needle soreness. The patient reported improved pain on the right after treatment but not on the left.     Clinical Presentation  Stable    Clinical Decision Making  Low    Rehab Potential  Good    PT Frequency  2x / week    PT Duration  2 weeks    PT Treatment/Interventions  Manual techniques;Dry needling;Moist Heat;Ultrasound;Traction;Passive range of motion;Patient/family education    PT Next Visit Plan  Dry needling    PT Home Exercise Plan  postural exer, ROM, rotation and extension snags with towel , isometrics neck     Consulted and Agree with Plan of Care  Patient       Patient will benefit from skilled therapeutic intervention in order to improve the following deficits and impairments:  Decreased range of  motion, Decreased activity tolerance, Postural dysfunction, Increased muscle spasms, Pain  Visit Diagnosis: Cervicalgia  Stiffness of cervical spine  Abnormal posture  Cramp  and spasm     Problem List Patient Active Problem List   Diagnosis Date Noted  . HEARTBURN 05/09/2008  . ABDOMINAL BLOATING 05/09/2008  . HYPERTENSION 05/05/2008  . CONSTIPATION 05/05/2008  . ARTHRITIS 05/05/2008  . SLEEP APNEA 05/05/2008  . HERNIORRHAPHY, HX OF 05/05/2008    Carney Living PT DPT  01/01/2018, 2:56 PM  [facility] [address] [place], [place], [address] Phone: [phone]   Fax:  [phone]  Name: Marvin West MRN: [id number] Date of Birth: [birth year]

## 2018-01-06 ENCOUNTER — Encounter: Payer: Self-pay | Admitting: Physical Therapy

## 2018-01-12 ENCOUNTER — Encounter: Payer: Self-pay | Admitting: Physical Therapy

## 2018-01-12 ENCOUNTER — Ambulatory Visit: Payer: Medicare Other | Admitting: Physical Therapy

## 2018-01-12 DIAGNOSIS — R293 Abnormal posture: Secondary | ICD-10-CM

## 2018-01-12 DIAGNOSIS — M436 Torticollis: Secondary | ICD-10-CM | POA: Diagnosis not present

## 2018-01-12 DIAGNOSIS — R252 Cramp and spasm: Secondary | ICD-10-CM | POA: Diagnosis not present

## 2018-01-12 DIAGNOSIS — M542 Cervicalgia: Secondary | ICD-10-CM

## 2018-01-12 NOTE — Therapy (Addendum)
[place], [place], [address] Phone: [phone]   Fax:  [phone]  Physical Therapy Treatment  Patient Details  Name: Marvin West MRN: [id number] Date of Birth: 16-Apr-1934 Referring Provider: Lavone Orn, MD   Encounter Date: 01/12/2018  PT End of Session - 01/12/18 1622    Visit Number  14    Number of Visits  16    Date for PT Re-Evaluation  01/30/18    Authorization Type  MCR    PT Start Time  1100    PT Stop Time  1142    PT Time Calculation (min)  42 min    Activity Tolerance  Patient tolerated treatment well;Patient limited by pain    Behavior During Therapy  [facility] for tasks assessed/performed       Past Medical History:  Diagnosis Date  . Arthritis   . Benign localized prostatic hyperplasia with lower urinary tract symptoms (LUTS)   . Chronic back pain   . CKD (chronic kidney disease), stage III ([place])   . Diverticulosis   . DJD (degenerative joint disease)   . ED (erectile dysfunction)   . GERD (gastroesophageal reflux disease)   . Gout    per last attack over a year 2016 approx.  . Hemorrhoids   . Hyperlipidemia   . Hypertension   . Hypogonadism in male   . OSA on CPAP   . Rectal bleeding   . Wears glasses     Past Surgical History:  Procedure Laterality Date  . ANAL FISTULOTOMY  07-04-2003     [facility]   and Internal Hemorrhoid Banding  . CATARACT EXTRACTION W/ INTRAOCULAR LENS  IMPLANT, BILATERAL  2013 approx.  . COLONOSCOPY WITH PROPOFOL N/A 07/27/2013   Procedure: COLONOSCOPY WITH PROPOFOL;  Surgeon: Garlan Fair, MD;  Location: [facility];  Service: Endoscopy;  Laterality: N/A;  . HEMORRHOID SURGERY N/A 10/02/2017   Procedure: HEMORRHOIDECTOMY, Anterior and Posterior;  Surgeon: Leighton Ruff, MD;  Location: [facility];  Service: General;  Laterality: N/A;  . INCISION AND DRAINAGE PERIRECTAL ABSCESS  03-22-2003     [facility]  . INGUINAL HERNIA REPAIR Right  08-14-2004    dr Ninfa Linden [facility]  . LUMBAR DISECTOMY  03/ 2015        [facility])  . NASAL SINUS SURGERY  07/29/2001  . THUMB ARTHROTOMY/  REMOVAL MULTIPLE LOOSE BODIES/ SYNVECTOMY/  RESECTION MUCOID CYST Left 01-16-2010    dr sypher  [facility]  . TOE SURGERY Bilateral   . TRANSURETHRAL RESECTION OF PROSTATE  1983  . UVULOPALATOPHARYNGOPLASTY  1993    There were no vitals filed for this visit.  Subjective Assessment - 01/12/18 1108    Subjective  Patient reports his neck was feeling pretty good but he got into a cab and banged his head and the pain came back. he reports it still isnt as bad as it was. His left side of doingwell but his right side s sore today.   (Pended)     Limitations  Lifting  (Pended)     How long can you sit comfortably?  NA  (Pended)     How long can you stand comfortably?  NA  (Pended)     How long can you walk comfortably?  NA  (Pended)     Diagnostic tests  xrays;  (Pended)     Patient Stated Goals  Decreasee pain and stiffness  (Pended)     Currently in Pain?  Yes  (  Pended)     Pain Score  8   (Pended)     Pain Location  Neck  (Pended)     Pain Orientation  Right;Posterior  (Pended)     Pain Descriptors / Indicators  Aching  (Pended)     Pain Type  Chronic pain  (Pended)     Pain Onset  More than a month ago  (Pended)                       [facility] Adult PT Treatment/Exercise - 01/13/18 0001      Moist Heat Therapy   Number Minutes Moist Heat  10 Minutes    Moist Heat Location  Cervical      Manual Therapy   Soft tissue mobilization  IASTYM to cervical spine and upper traps to reduce spasming and post needle soreness.     Manual Traction  sub-occipital release; manual traction        Trigger Point Dry Needling - 01/13/18 1524    Consent Given?  Yes    Education Handout Provided  Yes    Muscles Treated Upper Body  Upper trapezius;Longissimus    Upper Trapezius Response  Twitch reponse elicited    Longissimus Response  Twitch response elicited            PT Education - 01/12/18 1621    Education provided  Yes    Education Details  Reviewed benefits and risks of TPDN     Person(s) Educated  Patient    Methods  Explanation;Demonstration;Tactile cues;Verbal cues    Comprehension  Verbalized understanding;Returned demonstration;Verbal cues required       PT Short Term Goals - 01/12/18 1625      PT SHORT TERM GOAL #1   Title  He will be independent with initial HEP    Time  2    Period  Weeks    Status  Achieved      PT SHORT TERM GOAL #2   Title  He will report pain decreased 25% or more in neck    Baseline  sleeping better, no pain at rest     Time  3    Period  Weeks    Status  Achieved      PT SHORT TERM GOAL #3   Title  Cervical rotation will incr to  bilaterally to 45 degrees    Baseline  no change    Time  3    Period  Weeks    Status  On-going        PT Long Term Goals - 12/30/17 1230      PT LONG TERM GOAL #1   Title  He will be independent with all HEP issued    Status  On-going      PT LONG TERM GOAL #2   Title  He will report pain decreased 75% or more with   rotation to look for traffic    Baseline  pain in neck now but not in traps and shoulders    Status  Partially Met      PT LONG TERM GOAL #3   Title  He will improve  to 50 degrees cervical rotation to allow normal looking to side and behind     Status  Not Met      PT LONG TERM GOAL #4   Title  He will be able to get out of bed without pain    Status  On-going  Plan - 01/12/18 1622    Clinical Impression Statement  Pateitn had a good twtich response again today to needling of the upper traps on the right. He reported improved movement and improved ability to lie down without pain. Therapy also worked on manual therapy to reduce spasming. Therapy will contine to focus on needling and manual. He reports that he has been going to the gym and lifting 10lb weights. It may be making him sore. he was advised to lift light  weights when he goes to the gym.     Clinical Presentation  Stable    Clinical Decision Making  Low    Rehab Potential  Good    PT Frequency  2x / week    PT Duration  8 weeks    PT Treatment/Interventions  Manual techniques;Dry needling;Moist Heat;Ultrasound;Traction;Passive range of motion;Patient/family education    PT Next Visit Plan  Dry needling    PT Home Exercise Plan  postural exer, ROM, rotation and extension snags with towel , isometrics neck     Consulted and Agree with Plan of Care  Patient       Patient will benefit from skilled therapeutic intervention in order to improve the following deficits and impairments:  Decreased range of motion, Decreased activity tolerance, Postural dysfunction, Increased muscle spasms, Pain  Visit Diagnosis: Cervicalgia  Stiffness of cervical spine  Abnormal posture  Cramp and spasm     Problem List Patient Active Problem List   Diagnosis Date Noted  . HEARTBURN 05/09/2008  . ABDOMINAL BLOATING 05/09/2008  . HYPERTENSION 05/05/2008  . CONSTIPATION 05/05/2008  . ARTHRITIS 05/05/2008  . SLEEP APNEA 05/05/2008  . HERNIORRHAPHY, HX OF 05/05/2008    Carney Living 01/13/2018, 3:26 PM  [facility] [address] [place], [place], [address] Phone: [phone]   Fax:  [phone]  Name: KEATYN JAWAD MRN: [id number] Date of Birth: 07-Sep-1934

## 2018-01-15 ENCOUNTER — Ambulatory Visit: Payer: Medicare Other | Admitting: Physical Therapy

## 2018-01-15 DIAGNOSIS — M436 Torticollis: Secondary | ICD-10-CM

## 2018-01-15 DIAGNOSIS — M542 Cervicalgia: Secondary | ICD-10-CM

## 2018-01-15 DIAGNOSIS — R293 Abnormal posture: Secondary | ICD-10-CM

## 2018-01-15 DIAGNOSIS — R252 Cramp and spasm: Secondary | ICD-10-CM | POA: Diagnosis not present

## 2018-01-15 DIAGNOSIS — E291 Testicular hypofunction: Secondary | ICD-10-CM | POA: Diagnosis not present

## 2018-01-16 NOTE — Therapy (Signed)
[place], [place], [address] Phone: [phone]   Fax:  [phone]  Physical Therapy Treatment  Patient Details  Name: Marvin West MRN: [id number] Date of Birth: 07-May-1934 Referring Provider: Lavone Orn, MD   Encounter Date: 01/15/2018  PT End of Session - 01/16/18 1314    Visit Number  15    Number of Visits  16    Date for PT Re-Evaluation  01/30/18    Authorization Type  MCR    PT Start Time  1100    PT Stop Time  1141    PT Time Calculation (min)  41 min    Activity Tolerance  Patient tolerated treatment well;Patient limited by pain    Behavior During Therapy  [facility] for tasks assessed/performed       Past Medical History:  Diagnosis Date  . Arthritis   . Benign localized prostatic hyperplasia with lower urinary tract symptoms (LUTS)   . Chronic back pain   . CKD (chronic kidney disease), stage III ([place])   . Diverticulosis   . DJD (degenerative joint disease)   . ED (erectile dysfunction)   . GERD (gastroesophageal reflux disease)   . Gout    per last attack over a year 2016 approx.  . Hemorrhoids   . Hyperlipidemia   . Hypertension   . Hypogonadism in male   . OSA on CPAP   . Rectal bleeding   . Wears glasses     Past Surgical History:  Procedure Laterality Date  . ANAL FISTULOTOMY  07-04-2003     [facility]   and Internal Hemorrhoid Banding  . CATARACT EXTRACTION W/ INTRAOCULAR LENS  IMPLANT, BILATERAL  2013 approx.  . COLONOSCOPY WITH PROPOFOL N/A 07/27/2013   Procedure: COLONOSCOPY WITH PROPOFOL;  Surgeon: Garlan Fair, MD;  [facility];  Service: Endoscopy;  Laterality: N/A;  . HEMORRHOID SURGERY N/A 10/02/2017   Procedure: HEMORRHOIDECTOMY, Anterior and Posterior;  Surgeon: Leighton Ruff, MD;  Location: [facility];  Service: General;  Laterality: N/A;  . INCISION AND DRAINAGE PERIRECTAL ABSCESS  03-22-2003     [facility]  . INGUINAL HERNIA REPAIR Right  08-14-2004    dr Ninfa Linden [facility]  . LUMBAR DISECTOMY  03/ 2015        [facility])  . NASAL SINUS SURGERY  07/29/2001  . THUMB ARTHROTOMY/  REMOVAL MULTIPLE LOOSE BODIES/ SYNVECTOMY/  RESECTION MUCOID CYST Left 01-16-2010    dr sypher  [facility]  . TOE SURGERY Bilateral   . TRANSURETHRAL RESECTION OF PROSTATE  1983  . UVULOPALATOPHARYNGOPLASTY  1993    There were no vitals filed for this visit.  Subjective Assessment - 01/16/18 1313    Subjective  Patient reports his neck is better but he is still having some right sided soreness.     Limitations  Lifting    How long can you sit comfortably?  NA    How long can you stand comfortably?  NA    How long can you walk comfortably?  NA    Patient Stated Goals  Decreasee pain and stiffness    Currently in Pain?  Yes    Pain Score  4     Pain Location  Neck    Pain Orientation  Right;Posterior    Pain Descriptors / Indicators  Aching;Tightness    Pain Type  Chronic pain    Pain Onset  More than a month ago    Pain Frequency  Intermittent  Aggravating Factors   looking and rotating     Pain Relieving Factors  rest and massage     Multiple Pain Sites  No                      [facility] Adult PT Treatment/Exercise - 01/16/18 0001      Neck Exercises: Standing   Other Standing Exercises  scap retraction red 2x10; shoulder extension 2x10;       Neck Exercises: Supine   Other Supine Exercise  supine ER with red band       Moist Heat Therapy   Number Minutes Moist Heat  10 Minutes    Moist Heat Location  Cervical      Manual Therapy   Soft tissue mobilization  IASTYM to cervical spine and upper traps to reduce spasming and post needle soreness.     Manual Traction  sub-occipital release; manual traction        Trigger Point Dry Needling - 01/16/18 1312    Consent Given?  Yes    Education Handout Provided  Yes    Upper Trapezius Response  Twitch reponse elicited           PT Education - 01/16/18 1314    Education  provided  Yes    Education Details  reviewed self soft tissue mobilization and TPDN post needle soreness reduction     Person(s) Educated  Patient    Methods  Explanation;Demonstration;Tactile cues;Verbal cues    Comprehension  Verbalized understanding;Returned demonstration;Verbal cues required;Tactile cues required;Need further instruction       PT Short Term Goals - 01/12/18 1625      PT SHORT TERM GOAL #1   Title  He will be independent with initial HEP    Time  2    Period  Weeks    Status  Achieved      PT SHORT TERM GOAL #2   Title  He will report pain decreased 25% or more in neck    Baseline  sleeping better, no pain at rest     Time  3    Period  Weeks    Status  Achieved      PT SHORT TERM GOAL #3   Title  Cervical rotation will incr to  bilaterally to 45 degrees    Baseline  no change    Time  3    Period  Weeks    Status  On-going        PT Long Term Goals - 12/30/17 1230      PT LONG TERM GOAL #1   Title  He will be independent with all HEP issued    Status  On-going      PT LONG TERM GOAL #2   Title  He will report pain decreased 75% or more with   rotation to look for traffic    Baseline  pain in neck now but not in traps and shoulders    Status  Partially Met      PT LONG TERM GOAL #3   Title  He will improve  to 50 degrees cervical rotation to allow normal looking to side and behind     Status  Not Met      PT LONG TERM GOAL #4   Title  He will be able to get out of bed without pain    Status  On-going            Plan -  01/16/18 1315    Clinical Impression Statement  Therapy just needled his upper trap today. he reported no trigger pointsin his paraspinals. His motion is improving. Therapy reviewed light ther-ex with the patient. He has been going to the gym some. Therapy advised him to keep his weights low and to not do anything overhead or behind his head.     Clinical Presentation  Stable    Clinical Decision Making  Low    Rehab  Potential  Good    PT Frequency  2x / week    PT Duration  8 weeks    PT Treatment/Interventions  Manual techniques;Dry needling;Moist Heat;Ultrasound;Traction;Passive range of motion;Patient/family education    PT Next Visit Plan  Dry needling    PT Home Exercise Plan  postural exer, ROM, rotation and extension snags with towel , isometrics neck     Consulted and Agree with Plan of Care  Patient       Patient will benefit from skilled therapeutic intervention in order to improve the following deficits and impairments:  Decreased range of motion, Decreased activity tolerance, Postural dysfunction, Increased muscle spasms, Pain  Visit Diagnosis: Cervicalgia  Stiffness of cervical spine  Abnormal posture  Cramp and spasm     Problem List Patient Active Problem List   Diagnosis Date Noted  . HEARTBURN 05/09/2008  . ABDOMINAL BLOATING 05/09/2008  . HYPERTENSION 05/05/2008  . CONSTIPATION 05/05/2008  . ARTHRITIS 05/05/2008  . SLEEP APNEA 05/05/2008  . HERNIORRHAPHY, HX OF 05/05/2008    Carney Living PT DPT  01/16/2018, 1:17 PM  [place], [place], [address] Phone: [phone]   Fax:  [phone]  Name: Marvin West MRN: [id number] Date of Birth: 01-04-1934

## 2018-01-19 ENCOUNTER — Ambulatory Visit: Payer: Medicare Other | Admitting: Physical Therapy

## 2018-01-19 DIAGNOSIS — R252 Cramp and spasm: Secondary | ICD-10-CM | POA: Diagnosis not present

## 2018-01-19 DIAGNOSIS — R293 Abnormal posture: Secondary | ICD-10-CM | POA: Diagnosis not present

## 2018-01-19 DIAGNOSIS — M436 Torticollis: Secondary | ICD-10-CM

## 2018-01-19 DIAGNOSIS — M542 Cervicalgia: Secondary | ICD-10-CM | POA: Diagnosis not present

## 2018-01-19 NOTE — Therapy (Signed)
[place], [place], [address] Phone: [phone]   Fax:  [phone]  Physical Therapy Treatment  Patient Details  Name: Marvin West MRN: [id number] Date of Birth: 08-18-1934 Referring Provider: Lavone Orn, MD   Encounter Date: 01/19/2018  PT End of Session - 01/19/18 1159    Visit Number  16    Number of Visits  16    Date for PT Re-Evaluation  01/30/18    Authorization Type  MCR    PT Start Time  1145    PT Stop Time  1238    PT Time Calculation (min)  53 min    Activity Tolerance  Patient tolerated treatment well;Patient limited by pain    Behavior During Therapy  [facility] for tasks assessed/performed       Past Medical History:  Diagnosis Date  . Arthritis   . Benign localized prostatic hyperplasia with lower urinary tract symptoms (LUTS)   . Chronic back pain   . CKD (chronic kidney disease), stage III ([place])   . Diverticulosis   . DJD (degenerative joint disease)   . ED (erectile dysfunction)   . GERD (gastroesophageal reflux disease)   . Gout    per last attack over a year 2016 approx.  . Hemorrhoids   . Hyperlipidemia   . Hypertension   . Hypogonadism in male   . OSA on CPAP   . Rectal bleeding   . Wears glasses     Past Surgical History:  Procedure Laterality Date  . ANAL FISTULOTOMY  07-04-2003     [facility]   and Internal Hemorrhoid Banding  . CATARACT EXTRACTION W/ INTRAOCULAR LENS  IMPLANT, BILATERAL  2013 approx.  . COLONOSCOPY WITH PROPOFOL N/A 07/27/2013   Procedure: COLONOSCOPY WITH PROPOFOL;  Surgeon: Garlan Fair, MD;  [facility];  Service: Endoscopy;  Laterality: N/A;  . HEMORRHOID SURGERY N/A 10/02/2017   Procedure: HEMORRHOIDECTOMY, Anterior and Posterior;  Surgeon: Leighton Ruff, MD;  Location[facility];  Service: General;  Laterality: N/A;  . INCISION AND DRAINAGE PERIRECTAL ABSCESS  03-22-2003     [facility]  . INGUINAL HERNIA REPAIR Right  08-14-2004    dr Ninfa Linden [facility]  . LUMBAR DISECTOMY  03/ 2015        [facility])  . NASAL SINUS SURGERY  07/29/2001  . THUMB ARTHROTOMY/  REMOVAL MULTIPLE LOOSE BODIES/ SYNVECTOMY/  RESECTION MUCOID CYST Left 01-16-2010    dr sypher  [facility]  . TOE SURGERY Bilateral   . TRANSURETHRAL RESECTION OF PROSTATE  1983  . UVULOPALATOPHARYNGOPLASTY  1993    There were no vitals filed for this visit.  Subjective Assessment - 01/19/18 1150    Subjective  Patient reports hi neck is still sore but he feels like he is moving better. His left side is having very little pain. The pain on the right starts at the base of his skull and runs into his upper trap. He feels like it is better after the needling.     Limitations  Lifting    How long can you sit comfortably?  NA    How long can you stand comfortably?  NA    How long can you walk comfortably?  NA    Diagnostic tests  xrays;    Patient Stated Goals  Decreasee pain and stiffness    Currently in Pain?  Yes    Pain Score  3     Pain Location  Neck  Pain Orientation  Right;Posterior    Pain Descriptors / Indicators  Aching;Tightness    Pain Type  Chronic pain    Pain Onset  More than a month ago    Pain Frequency  Intermittent    Aggravating Factors   looking and rotating     Pain Relieving Factors  rest and massage          [facility] PT Assessment - 01/19/18 0001      AROM   Cervical Flexion  33    Cervical Extension  25    Cervical - Right Rotation  33    Cervical - Left Rotation  40                  [facility] Adult PT Treatment/Exercise - 01/19/18 0001      Neck Exercises: Standing   Other Standing Exercises  scap retraction red 2x10; shoulder extension 2x10;       Neck Exercises: Supine   Other Supine Exercise  supine ER with red band       Manual Therapy   Soft tissue mobilization  IASTYM to cervical spine and upper traps to reduce spasming and post needle soreness.     Manual Traction  sub-occipital release; manual traction         Trigger Point Dry Needling - 01/19/18 1228    Consent Given?  Yes    Education Handout Provided  Yes    Upper Trapezius Response  Twitch reponse elicited    Longissimus Response  Twitch response elicited           PT Education - 01/19/18 1533    Education provided  Yes    Education Details  reviewed ho to decrease post needle soreness     Person(s) Educated  Patient    Methods  Explanation;Demonstration;Tactile cues;Verbal cues    Comprehension  Verbalized understanding;Returned demonstration;Verbal cues required;Tactile cues required       PT Short Term Goals - 01/19/18 1535      PT SHORT TERM GOAL #1   Title  He will be independent with initial HEP    Time  2    Period  Weeks    Status  Achieved      PT SHORT TERM GOAL #2   Title  He will report pain decreased 25% or more in neck    Baseline  sleeping better, no pain at rest     Time  3    Period  Weeks    Status  Achieved      PT SHORT TERM GOAL #3   Title  Cervical rotation will incr to  bilaterally to 45 degrees    Baseline  improvement but not met     Time  3    Period  Weeks    Status  On-going        PT Long Term Goals - 12/30/17 1230      PT LONG TERM GOAL #1   Title  He will be independent with all HEP issued    Status  On-going      PT LONG TERM GOAL #2   Title  He will report pain decreased 75% or more with   rotation to look for traffic    Baseline  pain in neck now but not in traps and shoulders    Status  Partially Met      PT LONG TERM GOAL #3   Title  He will improve  to 50  degrees cervical rotation to allow normal looking to side and behind     Status  Not Met      PT LONG TERM GOAL #4   Title  He will be able to get out of bed without pain    Status  On-going            Plan - 01/19/18 1534    Clinical Impression Statement  Great twtich respose ellicited on the right side. Patients neck is now starting to progress. His FOTO score is still the same though. All neck  motions have improved but are still limited. The patient would like to focus on his back. [facility] [employer] [facility] [place].     Clinical Presentation  Stable    Clinical Decision Making  Low    Rehab Potential  Good    PT Frequency  2x / week    PT Duration  8 weeks    PT Treatment/Interventions  Manual techniques;Dry needling;Moist Heat;Ultrasound;Traction;Passive range of motion;Patient/family education    PT Next Visit Plan  Dry needling    PT Home Exercise Plan  postural exercises , ROM, rotation and extension snags with towel , isometrics neck     Consulted and Agree with Plan of Care  Patient       Patient will benefit from skilled therapeutic intervention in order to improve the following deficits and impairments:  Decreased range of motion, Decreased activity tolerance, Postural dysfunction, Increased muscle spasms, Pain  Visit Diagnosis: Cervicalgia  Stiffness of cervical spine  Abnormal posture  Cramp and spasm     Problem List Patient Active Problem List   Diagnosis Date Noted  . HEARTBURN 05/09/2008  . ABDOMINAL BLOATING 05/09/2008  . HYPERTENSION 05/05/2008  . CONSTIPATION 05/05/2008  . ARTHRITIS 05/05/2008  . SLEEP APNEA 05/05/2008  . HERNIORRHAPHY, HX OF 05/05/2008    Carney Living PT DPT  01/19/2018, 3:38 PM  [facility] [address] [place], [place], [address] Phone: [phone]   Fax:  [phone]  Name: DYLLAN HUGHETT MRN: [id number] Date of Birth: 01-13-1934

## 2018-01-22 ENCOUNTER — Telehealth: Payer: Self-pay

## 2018-01-26 ENCOUNTER — Encounter: Payer: Self-pay | Admitting: Physical Therapy

## 2018-01-26 ENCOUNTER — Ambulatory Visit: Payer: Medicare Other | Attending: Internal Medicine | Admitting: Physical Therapy

## 2018-01-26 DIAGNOSIS — M436 Torticollis: Secondary | ICD-10-CM | POA: Diagnosis not present

## 2018-01-26 DIAGNOSIS — M5442 Lumbago with sciatica, left side: Secondary | ICD-10-CM | POA: Diagnosis not present

## 2018-01-26 DIAGNOSIS — G8929 Other chronic pain: Secondary | ICD-10-CM | POA: Insufficient documentation

## 2018-01-26 DIAGNOSIS — R252 Cramp and spasm: Secondary | ICD-10-CM | POA: Insufficient documentation

## 2018-01-26 DIAGNOSIS — M542 Cervicalgia: Secondary | ICD-10-CM | POA: Diagnosis not present

## 2018-01-26 DIAGNOSIS — R293 Abnormal posture: Secondary | ICD-10-CM | POA: Diagnosis not present

## 2018-01-26 NOTE — Therapy (Signed)
[place] [address] [place] [place], [place], [address] Phone: [phone]   Fax:  [phone]  Physical Therapy Evaluation  Patient Details  Name: Marvin West MRN: [id number] Date of Birth: [birth year] Referring Provider: Lavone Orn   Encounter Date: 01/26/2018  PT End of Session - 01/26/18 1059    Visit Number  1    Date for PT Re-Evaluation  03/27/18    Authorization Type  MCR    PT Start Time  1015    PT Stop Time  1110    PT Time Calculation (min)  55 min    Activity Tolerance  Patient tolerated treatment well;Patient limited by pain    Behavior During Therapy  [facility] for tasks assessed/performed       Past Medical History:  Diagnosis Date  . Arthritis   . Benign localized prostatic hyperplasia with lower urinary tract symptoms (LUTS)   . Chronic back pain   . CKD (chronic kidney disease), stage III ([place])   . Diverticulosis   . DJD (degenerative joint disease)   . ED (erectile dysfunction)   . GERD (gastroesophageal reflux disease)   . Gout    per last attack over a year 2016 approx.  . Hemorrhoids   . Hyperlipidemia   . Hypertension   . Hypogonadism in male   . OSA on CPAP   . Rectal bleeding   . Wears glasses     Past Surgical History:  Procedure Laterality Date  . ANAL FISTULOTOMY  07-04-2003     [facility]   and Internal Hemorrhoid Banding  . CATARACT EXTRACTION W/ INTRAOCULAR LENS  IMPLANT, BILATERAL  2013 approx.  . COLONOSCOPY WITH PROPOFOL N/A 07/27/2013   Procedure: COLONOSCOPY WITH PROPOFOL;  Surgeon: Garlan Fair, MD;  Location: [facility];  Service: Endoscopy;  Laterality: N/A;  . HEMORRHOID SURGERY N/A 10/02/2017   Procedure: HEMORRHOIDECTOMY, Anterior and Posterior;  Surgeon: Leighton Ruff, MD;  Location: [facility];  Service: General;  Laterality: N/A;  . INCISION AND DRAINAGE PERIRECTAL ABSCESS  03-22-2003     [facility]  . INGUINAL HERNIA REPAIR Right 08-14-2004    dr  Ninfa Linden [facility]  . LUMBAR DISECTOMY  03/ 2015        [facility])  . NASAL SINUS SURGERY  07/29/2001  . THUMB ARTHROTOMY/  REMOVAL MULTIPLE LOOSE BODIES/ SYNVECTOMY/  RESECTION MUCOID CYST Left 01-16-2010    dr sypher  [facility]  . TOE SURGERY Bilateral   . TRANSURETHRAL RESECTION OF PROSTATE  1983  . UVULOPALATOPHARYNGOPLASTY  1993    There were no vitals filed for this visit.   Subjective Assessment - 01/26/18 1018    Subjective  Patient transfers her from our [address] location, he was seeing them for neck pain, he transfers here due to proximity and a new diagnosis and referral for LBP.  He reports a lumbar surgery in 2015.  Reports that he describes the pain as more of stiffness.  No recent x-rays are reported.    Limitations  Lifting    Patient Stated Goals  less pain and stiffness    Currently in Pain?  Yes    Pain Score  3     Pain Location  Back    Pain Orientation  Lower    Pain Descriptors / Indicators  Sore;Tightness    Pain Type  Chronic pain    Pain Onset  More than a month ago    Pain Frequency  Constant  Aggravating Factors   first thing in the morning, bending the pain can be 7/10, reports every now and then will have pain down the left posterior leg    Pain Relieving Factors  rest, reports that the dry needling helped the neck    Effect of Pain on Daily Activities  reports "very stiff " difficulty moving         [facility] PT Assessment - 01/26/18 0001      Assessment   Medical Diagnosis  Low back pain    Referring Provider  Lavone Orn    Hand Dominance  Right    Prior Therapy  for the neck       Precautions   Precautions  None      Balance Screen   Has the patient fallen in the past 6 months  No    Has the patient had a decrease in activity level because of a fear of falling?   No    Is the patient reluctant to leave their home because of a fear of falling?   No      Home Environment   Additional Comments  Does some housework, ground floor apartment       Prior Function   Level of Independence  Independent    Vocation  Retired    Careers adviser    Leisure  reports that he goes to the gym to walk and occasional weights, would like to play golf, reports that he has not played in a while      Posture/Postural Control   Posture Comments  decreased lordosis      AROM   Overall AROM Comments  lumbar ROM was decreased 75% for all motions with c/o stiffness    Cervical Flexion  33    Cervical Extension  25    Cervical - Right Rotation  30    Cervical - Left Rotation  35      Strength   Overall Strength Comments  LE's 4/5      Flexibility   Soft Tissue Assessment /Muscle Length  yes    Hamstrings  very tight SLR at 30 degrees bilaterally with pain inth elegs and buttocks    Quadriceps  very tight    ITB  Very tight and painful    Piriformis  mild tightness      Palpation   Palpation comment  very tight and sore in the upper traps and the cervical area, is very tender in the left buttock      Transfers   Comments  he has a lot of difficulty getting up from supine due to neck and back issues.  Biggest issue is lifting head up             Objective measurements completed on examination: See above findings.      [facility] Adult PT Treatment/Exercise - 01/26/18 0001      Modalities   Modalities  Electrical Stimulation      Moist Heat Therapy   Number Minutes Moist Heat  15 Minutes    Moist Heat Location  Cervical;Lumbar Spine      Electrical Stimulation   Electrical Stimulation Location  cervical area    Electrical Stimulation Action  IFC    Electrical Stimulation Parameters  supine    Electrical Stimulation Goals  Pain             PT Education - 01/26/18 1059    Education provided  Yes  Education Details  gave HEP for LE flexibility    Person(s) Educated  Patient    Methods  Explanation;Demonstration;Handout    Comprehension  Verbalized understanding       PT Short Term Goals - 01/26/18  1118      PT SHORT TERM GOAL #1   Title  He will be independent with initial HEP    Time  2    Period  Weeks    Status  New        PT Long Term Goals - 01/26/18 1118      PT LONG TERM GOAL #1   Title  He will be independent with advanced HEP and safe with a gym    Time  8    Period  Weeks    Status  New      PT LONG TERM GOAL #2   Title  He will report pain decreased 75% or more with   rotation to look for traffic    Time  8    Period  Weeks    Status  New      PT LONG TERM GOAL #3   Title  He will improve  to 50 degrees cervical rotation to allow normal looking to side and behind     Time  8    Period  Weeks    Status  New      PT LONG TERM GOAL #4   Title  He will be able to get out of bed without pain    Time  8    Period  Weeks    Status  New      PT LONG TERM GOAL #5   Title  increase lumbar ROM 25%    Time  8    Period  Weeks    Status  New             Plan - 01/26/18 1100    Clinical Impression Statement  Patient comes to our site as we are closer to his home, he has been seeing [employer] for neck issues and has been getting good results from dry needling.  He remains very stiff with limited motions in the cervical area with spasms.  His low back is what we are seeing him for today for, he has decreased ROM of about 75% and his LE's are very tight, SLR was 30 degrees, ITB tight and paingul, quads tight and painful.  He shuffles his feet with gait, he has very difficult time getting up from supine due to neck and back pain    Clinical Presentation  Stable    Clinical Decision Making  Low    Rehab Potential  Good    PT Frequency  2x / week    PT Duration  8 weeks    PT Treatment/Interventions  Manual techniques;Dry needling;Moist Heat;Ultrasound;Traction;Passive range of motion;Patient/family education;Electrical Stimulation;Cryotherapy;ADLs/Self Care Home Management;Neuromuscular re-education;Therapeutic exercise;Therapeutic activities    PT Next  Visit Plan  he is to try HEP, may need to give info about the TENS, he is to take pix of his gym and we can start him doing some exercises, he is very tight in the LE's    Consulted and Agree with Plan of Care  Patient       Patient will benefit from skilled therapeutic intervention in order to improve the following deficits and impairments:  Decreased range of motion, Decreased activity tolerance, Postural dysfunction, Increased muscle spasms, Pain, Difficulty walking, Impaired flexibility,  Decreased mobility, Decreased strength  Visit Diagnosis: Cervicalgia - Plan: PT plan of care cert/re-cert  Stiffness of cervical spine - Plan: PT plan of care cert/re-cert  Abnormal posture - Plan: PT plan of care cert/re-cert  Cramp and spasm - Plan: PT plan of care cert/re-cert  Chronic bilateral low back pain with left-sided sciatica - Plan: PT plan of care cert/re-cert     Problem List Patient Active Problem List   Diagnosis Date Noted  . HEARTBURN 05/09/2008  . ABDOMINAL BLOATING 05/09/2008  . HYPERTENSION 05/05/2008  . CONSTIPATION 05/05/2008  . ARTHRITIS 05/05/2008  . SLEEP APNEA 05/05/2008  . HERNIORRHAPHY, HX OF 05/05/2008    Sumner Boast., PT 01/26/2018, 11:21 AM  [address] [place], [place], [address] Phone: [phone]   Fax:  [phone]  Name: Marvin West MRN: [id number] Date of Birth: Jan 22, 1934

## 2018-01-28 ENCOUNTER — Encounter: Payer: Self-pay | Admitting: Physical Therapy

## 2018-01-28 ENCOUNTER — Ambulatory Visit: Payer: Medicare Other | Admitting: Physical Therapy

## 2018-01-28 DIAGNOSIS — M542 Cervicalgia: Secondary | ICD-10-CM | POA: Diagnosis not present

## 2018-01-28 DIAGNOSIS — R293 Abnormal posture: Secondary | ICD-10-CM

## 2018-01-28 DIAGNOSIS — M436 Torticollis: Secondary | ICD-10-CM

## 2018-01-28 DIAGNOSIS — M5442 Lumbago with sciatica, left side: Secondary | ICD-10-CM | POA: Diagnosis not present

## 2018-01-28 DIAGNOSIS — G8929 Other chronic pain: Secondary | ICD-10-CM | POA: Diagnosis not present

## 2018-01-28 DIAGNOSIS — R252 Cramp and spasm: Secondary | ICD-10-CM | POA: Diagnosis not present

## 2018-01-28 NOTE — Therapy (Signed)
[address] [place], [place], [address] Phone: [phone]   Fax:  [phone]  Physical Therapy Treatment  Patient Details  Name: Marvin West MRN: [id number] Date of Birth: 08-Aug-1934 Referring Provider: Lavone Orn   Encounter Date: 01/28/2018  PT End of Session - 01/28/18 0847    Visit Number  2    Number of Visits  16    Date for PT Re-Evaluation  03/27/18    PT Start Time  0800    PT Stop Time  0902    PT Time Calculation (min)  62 min       Past Medical History:  Diagnosis Date  . Arthritis   . Benign localized prostatic hyperplasia with lower urinary tract symptoms (LUTS)   . Chronic back pain   . CKD (chronic kidney disease), stage III ([place])   . Diverticulosis   . DJD (degenerative joint disease)   . ED (erectile dysfunction)   . GERD (gastroesophageal reflux disease)   . Gout    per last attack over a year 2016 approx.  . Hemorrhoids   . Hyperlipidemia   . Hypertension   . Hypogonadism in male   . OSA on CPAP   . Rectal bleeding   . Wears glasses     Past Surgical History:  Procedure Laterality Date  . ANAL FISTULOTOMY  07-04-2003     [facility]   and Internal Hemorrhoid Banding  . CATARACT EXTRACTION W/ INTRAOCULAR LENS  IMPLANT, BILATERAL  2013 approx.  . COLONOSCOPY WITH PROPOFOL N/A 07/27/2013   Procedure: COLONOSCOPY WITH PROPOFOL;  Surgeon: Garlan Fair, MD;  [facility];  Service: Endoscopy;  Laterality: N/A;  . HEMORRHOID SURGERY N/A 10/02/2017   Procedure: HEMORRHOIDECTOMY, Anterior and Posterior;  Surgeon: Leighton Ruff, MD;  [facility];  Service: General;  Laterality: N/A;  . INCISION AND DRAINAGE PERIRECTAL ABSCESS  03-22-2003     [facility]  . INGUINAL HERNIA REPAIR Right 08-14-2004    dr Ninfa Linden [facility]  . LUMBAR DISECTOMY  03/ 2015        [facility])  . NASAL SINUS SURGERY  07/29/2001  . THUMB ARTHROTOMY/  REMOVAL MULTIPLE LOOSE BODIES/  SYNVECTOMY/  RESECTION MUCOID CYST Left 01-16-2010    dr sypher  [facility]  . TOE SURGERY Bilateral   . TRANSURETHRAL RESECTION OF PROSTATE  1983  . UVULOPALATOPHARYNGOPLASTY  1993    There were no vitals filed for this visit.  Subjective Assessment - 01/28/18 0801    Subjective  I tell you what , I don't feel as good as I did the other day I came in here"    Currently in Pain?  Yes    Pain Score  5     Pain Location  -- neck and back                       [facility] Adult PT Treatment/Exercise - 01/28/18 0001      Exercises   Exercises  Lumbar      Lumbar Exercises: Stretches   Passive Hamstring Stretch  4 reps;10 seconds;Right;Left    Single Knee to Chest Stretch  4 reps;10 seconds;Left;Right    Lower Trunk Rotation  3 reps;10 seconds      Lumbar Exercises: Aerobic   Nustep  L3 x 6 min       Lumbar Exercises: Standing   Row  5 reps;Theraband;Both x2    Theraband  Level (Row)  Level 3 (Green)    Shoulder Extension  Theraband;10 reps;Both    Theraband Level (Shoulder Extension)  Level 3 (Green)      Lumbar Exercises: Seated   Sit to Stand  10 reps;5 reps UE support    Other Seated Lumbar Exercises  Trunk rottions yellow ball 2x10     Other Seated Lumbar Exercises  Standing march x10, x5 each      Lumbar Exercises: Supine   Bridge  2 seconds;10 reps    Other Supine Lumbar Exercises  LE on ohyso ball bridges,K2c, oblq      Moist Heat Therapy   Number Minutes Moist Heat  15 Minutes    Moist Heat Location  Cervical;Lumbar Spine      Electrical Stimulation   Electrical Stimulation Location  Low back R trap    Electrical Stimulation Action  pre modx    Electrical Stimulation Parameters  supine    Electrical Stimulation Goals  Pain               PT Short Term Goals - 01/28/18 2831      PT SHORT TERM GOAL #1   Title  He will be independent with initial HEP    Status  Achieved        PT Long Term Goals - 01/26/18 1118      PT LONG TERM GOAL #1    Title  He will be independent with advanced HEP and safe with a gym    Time  8    Period  Weeks    Status  New      PT LONG TERM GOAL #2   Title  He will report pain decreased 75% or more with   rotation to look for traffic    Time  8    Period  Weeks    Status  New      PT LONG TERM GOAL #3   Title  He will improve  to 50 degrees cervical rotation to allow normal looking to side and behind     Time  8    Period  Weeks    Status  New      PT LONG TERM GOAL #4   Title  He will be able to get out of bed without pain    Time  8    Period  Weeks    Status  New      PT LONG TERM GOAL #5   Title  increase lumbar ROM 25%    Time  8    Period  Weeks    Status  New            Plan - 01/28/18 0848    Clinical Impression Statement  Pt ambulates with stiff rigid posture, brightness in both bilat Hs noted with passive stretching. Postural que's given during standing Rows and extensions. Pt appears to be very still and with limited motion in cervical area often turning shoulders to look .    Rehab Potential  Good    PT Frequency  2x / week    PT Duration  8 weeks    PT Treatment/Interventions  Manual techniques;Dry needling;Moist Heat;Ultrasound;Traction;Passive range of motion;Patient/family education;Electrical Stimulation;Cryotherapy;ADLs/Self Care Home Management;Neuromuscular re-education;Therapeutic exercise;Therapeutic activities    PT Next Visit Plan  he is to try HEP, may need to give info about the TENS, he is very tight in the LE's       Patient will benefit from skilled  therapeutic intervention in order to improve the following deficits and impairments:  Decreased range of motion, Decreased activity tolerance, Postural dysfunction, Increased muscle spasms, Pain, Difficulty walking, Impaired flexibility, Decreased mobility, Decreased strength  Visit Diagnosis: Cervicalgia  Stiffness of cervical spine  Abnormal posture     Problem List Patient Active Problem  List   Diagnosis Date Noted  . HEARTBURN 05/09/2008  . ABDOMINAL BLOATING 05/09/2008  . HYPERTENSION 05/05/2008  . CONSTIPATION 05/05/2008  . ARTHRITIS 05/05/2008  . SLEEP APNEA 05/05/2008  . HERNIORRHAPHY, HX OF 05/05/2008    Scot Jun, PTA 01/28/2018, 8:54 AM  [place] [address] [place], [place], [address] Phone: [phone]   Fax:  [phone]  Name: PATRICH HEINZE MRN: [id number] Date of Birth: 01/17/1934

## 2018-02-03 ENCOUNTER — Encounter: Payer: Self-pay | Admitting: Physical Therapy

## 2018-02-03 ENCOUNTER — Ambulatory Visit: Payer: Medicare Other | Admitting: Physical Therapy

## 2018-02-03 DIAGNOSIS — M5442 Lumbago with sciatica, left side: Secondary | ICD-10-CM | POA: Diagnosis not present

## 2018-02-03 DIAGNOSIS — M542 Cervicalgia: Secondary | ICD-10-CM | POA: Diagnosis not present

## 2018-02-03 DIAGNOSIS — M436 Torticollis: Secondary | ICD-10-CM | POA: Diagnosis not present

## 2018-02-03 DIAGNOSIS — G8929 Other chronic pain: Secondary | ICD-10-CM | POA: Diagnosis not present

## 2018-02-03 DIAGNOSIS — E291 Testicular hypofunction: Secondary | ICD-10-CM | POA: Diagnosis not present

## 2018-02-03 DIAGNOSIS — R293 Abnormal posture: Secondary | ICD-10-CM

## 2018-02-03 DIAGNOSIS — J069 Acute upper respiratory infection, unspecified: Secondary | ICD-10-CM | POA: Diagnosis not present

## 2018-02-03 DIAGNOSIS — R252 Cramp and spasm: Secondary | ICD-10-CM | POA: Diagnosis not present

## 2018-02-03 NOTE — Therapy (Signed)
[place] [address] [place], [place], [address] Phone: [phone]   Fax:  [phone]  Physical Therapy Treatment  Patient Details  Name: Marvin West MRN: [id number] Date of Birth: [birth year] Referring Provider: Lavone Orn   Encounter Date: 02/03/2018  PT End of Session - 02/03/18 1156    Visit Number  3    Date for PT Re-Evaluation  03/27/18    PT Start Time  1100    PT Stop Time  1200    PT Time Calculation (min)  60 min    Activity Tolerance  Patient tolerated treatment well;Patient limited by pain    Behavior During Therapy  [facility] for tasks assessed/performed       Past Medical History:  Diagnosis Date  . Arthritis   . Benign localized prostatic hyperplasia with lower urinary tract symptoms (LUTS)   . Chronic back pain   . CKD (chronic kidney disease), stage III ([place])   . Diverticulosis   . DJD (degenerative joint disease)   . ED (erectile dysfunction)   . GERD (gastroesophageal reflux disease)   . Gout    per last attack over a year 2016 approx.  . Hemorrhoids   . Hyperlipidemia   . Hypertension   . Hypogonadism in male   . OSA on CPAP   . Rectal bleeding   . Wears glasses     Past Surgical History:  Procedure Laterality Date  . ANAL FISTULOTOMY  07-04-2003     [facility]   and Internal Hemorrhoid Banding  . CATARACT EXTRACTION W/ INTRAOCULAR LENS  IMPLANT, BILATERAL  2013 approx.  . COLONOSCOPY WITH PROPOFOL N/A 07/27/2013   Procedure: COLONOSCOPY WITH PROPOFOL;  Surgeon: Garlan Fair, MD;  [facility];  Service: Endoscopy;  Laterality: N/A;  . HEMORRHOID SURGERY N/A 10/02/2017   Procedure: HEMORRHOIDECTOMY, Anterior and Posterior;  Surgeon: Leighton Ruff, MD;  Location: [facility];  Service: General;  Laterality: N/A;  . INCISION AND DRAINAGE PERIRECTAL ABSCESS  03-22-2003     [facility]  . INGUINAL HERNIA REPAIR Right 08-14-2004    dr Ninfa Linden [facility]  . LUMBAR  DISECTOMY  03/ 2015        [facility])  . NASAL SINUS SURGERY  07/29/2001  . THUMB ARTHROTOMY/  REMOVAL MULTIPLE LOOSE BODIES/ SYNVECTOMY/  RESECTION MUCOID CYST Left 01-16-2010    dr sypher  [facility]  . TOE SURGERY Bilateral   . TRANSURETHRAL RESECTION OF PROSTATE  1983  . UVULOPALATOPHARYNGOPLASTY  1993    There were no vitals filed for this visit.  Subjective Assessment - 02/03/18 1107    Subjective  Pt reports thats he can feel it in his necka nd back. Not really pain    Currently in Pain?  No/denies    Pain Score  0-No pain                      [facility] Adult PT Treatment/Exercise - 02/03/18 0001      Lumbar Exercises: Stretches   Passive Hamstring Stretch  4 reps;10 seconds;Right;Left    Single Knee to Chest Stretch  4 reps;10 seconds;Left;Right    Lower Trunk Rotation  3 reps;10 seconds      Lumbar Exercises: Aerobic   Nustep  L3 x 6 min       Lumbar Exercises: Standing   Row  Theraband;Both;20 reps    Theraband Level (Row)  Level 4 (Blue)    Shoulder Extension  Theraband;Both;20 reps    Theraband Level (Shoulder Extension)  Level 4 (Blue)      Lumbar Exercises: Supine   Bridge  2 seconds;10 reps      Theme park manager  Low back R trap    Electrical Stimulation Action  pre mod    Electrical Stimulation Parameters  supine    Electrical Stimulation Goals  Pain       Trigger Point Dry Needling - 02/03/18 1145    Consent Given?  Yes    Muscles Treated Upper Body  Upper trapezius    Upper Trapezius Response  Twitch reponse elicited    Longissimus Response  Twitch response elicited             PT Short Term Goals - 01/28/18 0853      PT SHORT TERM GOAL #1   Title  He will be independent with initial HEP    Status  Achieved        PT Long Term Goals - 02/03/18 1202      PT LONG TERM GOAL #1   Title  He will be independent with advanced HEP and safe with a gym    Status  On-going      PT LONG TERM GOAL  #2   Title  He will report pain decreased 75% or more with   rotation to look for traffic    Status  On-going            Plan - 02/03/18 1156    Clinical Impression Statement  Patient wanted to try the dry needling today.  He remians very stiff in the LE's, has a very stiff neck with fwd head posture and requires a lot of cues to try to correct.  He had some good response with the dry needling    PT Next Visit Plan  see if the dry needling helped any, work on posture, flexibility and strength    Consulted and Agree with Plan of Care  Patient       Patient will benefit from skilled therapeutic intervention in order to improve the following deficits and impairments:     Visit Diagnosis: Cervicalgia  Stiffness of cervical spine  Abnormal posture  Cramp and spasm  Chronic bilateral low back pain with left-sided sciatica     Problem List Patient Active Problem List   Diagnosis Date Noted  . HEARTBURN 05/09/2008  . ABDOMINAL BLOATING 05/09/2008  . HYPERTENSION 05/05/2008  . CONSTIPATION 05/05/2008  . ARTHRITIS 05/05/2008  . SLEEP APNEA 05/05/2008  . HERNIORRHAPHY, HX OF 05/05/2008    Sumner Boast., PT 02/03/2018, 12:03 PM  [address] [place], [place], [address] Phone: [phone]   Fax:  [phone]  Name: CHAIS FEHRINGER MRN: [id number] Date of Birth: 01/03/1934

## 2018-02-04 DIAGNOSIS — Z85828 Personal history of other malignant neoplasm of skin: Secondary | ICD-10-CM | POA: Diagnosis not present

## 2018-02-04 DIAGNOSIS — L309 Dermatitis, unspecified: Secondary | ICD-10-CM | POA: Diagnosis not present

## 2018-02-04 DIAGNOSIS — B351 Tinea unguium: Secondary | ICD-10-CM | POA: Diagnosis not present

## 2018-02-05 ENCOUNTER — Ambulatory Visit: Payer: Medicare Other | Admitting: Physical Therapy

## 2018-02-09 DIAGNOSIS — E291 Testicular hypofunction: Secondary | ICD-10-CM | POA: Diagnosis not present

## 2018-02-10 ENCOUNTER — Ambulatory Visit: Payer: Medicare Other | Admitting: Physical Therapy

## 2018-02-10 ENCOUNTER — Encounter: Payer: Self-pay | Admitting: Physical Therapy

## 2018-02-10 DIAGNOSIS — R293 Abnormal posture: Secondary | ICD-10-CM

## 2018-02-10 DIAGNOSIS — M542 Cervicalgia: Secondary | ICD-10-CM

## 2018-02-10 DIAGNOSIS — R252 Cramp and spasm: Secondary | ICD-10-CM

## 2018-02-10 DIAGNOSIS — M436 Torticollis: Secondary | ICD-10-CM

## 2018-02-10 DIAGNOSIS — M5442 Lumbago with sciatica, left side: Secondary | ICD-10-CM

## 2018-02-10 DIAGNOSIS — G8929 Other chronic pain: Secondary | ICD-10-CM

## 2018-02-10 NOTE — Therapy (Signed)
[place] [address] [place], [place], [address] Phone: [phone]   Fax:  [phone]  Physical Therapy Treatment  Patient Details  Name: Marvin West MRN: [id number] Date of Birth: Dec 31, 1933 Referring Provider: Lavone Orn   Encounter Date: 02/10/2018  PT End of Session - 02/10/18 1649    Visit Number  4    Date for PT Re-Evaluation  03/27/18    PT Start Time  1550    PT Stop Time  1650    PT Time Calculation (min)  60 min    Activity Tolerance  Patient tolerated treatment well;Patient limited by pain    Behavior During Therapy  [facility] for tasks assessed/performed       Past Medical History:  Diagnosis Date  . Arthritis   . Benign localized prostatic hyperplasia with lower urinary tract symptoms (LUTS)   . Chronic back pain   . CKD (chronic kidney disease), stage III ([place])   . Diverticulosis   . DJD (degenerative joint disease)   . ED (erectile dysfunction)   . GERD (gastroesophageal reflux disease)   . Gout    per last attack over a year 2016 approx.  . Hemorrhoids   . Hyperlipidemia   . Hypertension   . Hypogonadism in male   . OSA on CPAP   . Rectal bleeding   . Wears glasses     Past Surgical History:  Procedure Laterality Date  . ANAL FISTULOTOMY  07-04-2003     [facility]   and Internal Hemorrhoid Banding  . CATARACT EXTRACTION W/ INTRAOCULAR LENS  IMPLANT, BILATERAL  2013 approx.  . COLONOSCOPY WITH PROPOFOL N/A 07/27/2013   Procedure: COLONOSCOPY WITH PROPOFOL;  Surgeon: Garlan Fair, MD;  [facility];  Service: Endoscopy;  Laterality: N/A;  . HEMORRHOID SURGERY N/A 10/02/2017   Procedure: HEMORRHOIDECTOMY, Anterior and Posterior;  Surgeon: Leighton Ruff, MD;  Location: [facility];  Service: General;  Laterality: N/A;  . INCISION AND DRAINAGE PERIRECTAL ABSCESS  03-22-2003     [facility]  . INGUINAL HERNIA REPAIR Right 08-14-2004    dr Ninfa Linden [facility]  . LUMBAR  DISECTOMY  03/ 2015        [facility])  . NASAL SINUS SURGERY  07/29/2001  . THUMB ARTHROTOMY/  REMOVAL MULTIPLE LOOSE BODIES/ SYNVECTOMY/  RESECTION MUCOID CYST Left 01-16-2010    dr sypher  [facility]  . TOE SURGERY Bilateral   . TRANSURETHRAL RESECTION OF PROSTATE  1983  . UVULOPALATOPHARYNGOPLASTY  1993    There were no vitals filed for this visit.  Subjective Assessment - 02/10/18 1640    Subjective  Patient was very stiff in his movements today. C/O a catch    Currently in Pain?  Yes    Pain Score  4     Pain Location  Neck                      [facility] Adult PT Treatment/Exercise - 02/10/18 0001      Moist Heat Therapy   Number Minutes Moist Heat  15 Minutes    Moist Heat Location  Cervical;Lumbar Spine      Electrical Stimulation   Electrical Stimulation Location  cervical area    Electrical Stimulation Action  IFC    Electrical Stimulation Parameters  supine    Electrical Stimulation Goals  Pain      Manual Therapy   Manual Therapy  Passive ROM    Manual  therapy comments  PA glides C1 to C7 central and lateral RT and LT Gr 3-4     Soft tissue mobilization  to the cervical parapsinals and the lateral neck area    Passive ROM  PROM and AROM with overpressures to all motions, used fingers on the transverse processes to assist with increased rotation, side bending and flexion and extension, some contract relax stretches for all motions    Manual Traction  Manual distraction with gentle rotations               PT Short Term Goals - 01/28/18 2035      PT SHORT TERM GOAL #1   Title  He will be independent with initial HEP    Status  Achieved        PT Long Term Goals - 02/10/18 1652      PT LONG TERM GOAL #3   Title  He will improve  to 50 degrees cervical rotation to allow normal looking to side and behind     Status  On-going      PT LONG TERM GOAL #4   Title  He will be able to get out of bed without pain    Status  On-going             Plan - 02/10/18 1650    Clinical Impression Statement  Patient with very stiff neck, he was sitting in a slouched posistion in teh lobby and when I called him back he turned his whole body with minimal to no rotation of the cervical spine.  He is very tight and gaurded with motions, it seems that over many years of this he has hypertrophy of the neck mms and they do not relax, he may have some facet issues but due to the gaurding it will be hard to gain the ROM    PT Next Visit Plan  see how todays treatments worked on him    Consulted and Agree with Plan of Care  Patient       Patient will benefit from skilled therapeutic intervention in order to improve the following deficits and impairments:  Decreased range of motion, Decreased activity tolerance, Postural dysfunction, Increased muscle spasms, Pain, Difficulty walking, Impaired flexibility, Decreased mobility, Decreased strength  Visit Diagnosis: Cervicalgia  Stiffness of cervical spine  Abnormal posture  Cramp and spasm  Chronic bilateral low back pain with left-sided sciatica     Problem List Patient Active Problem List   Diagnosis Date Noted  . HEARTBURN 05/09/2008  . ABDOMINAL BLOATING 05/09/2008  . HYPERTENSION 05/05/2008  . CONSTIPATION 05/05/2008  . ARTHRITIS 05/05/2008  . SLEEP APNEA 05/05/2008  . HERNIORRHAPHY, HX OF 05/05/2008    Sumner Boast., PT 02/10/2018, 4:53 PM  [address] [place], [place], [address] Phone: [phone]   Fax:  [phone]  Name: Marvin West MRN: [id number] Date of Birth: [birth year]

## 2018-02-13 DIAGNOSIS — H40013 Open angle with borderline findings, low risk, bilateral: Secondary | ICD-10-CM | POA: Diagnosis not present

## 2018-02-13 DIAGNOSIS — Z961 Presence of intraocular lens: Secondary | ICD-10-CM | POA: Diagnosis not present

## 2018-02-13 DIAGNOSIS — H35033 Hypertensive retinopathy, bilateral: Secondary | ICD-10-CM | POA: Diagnosis not present

## 2018-02-13 DIAGNOSIS — H35362 Drusen (degenerative) of macula, left eye: Secondary | ICD-10-CM | POA: Diagnosis not present

## 2018-02-17 ENCOUNTER — Encounter: Payer: Self-pay | Admitting: Physical Therapy

## 2018-02-17 ENCOUNTER — Ambulatory Visit: Payer: Medicare Other | Admitting: Physical Therapy

## 2018-02-17 DIAGNOSIS — R293 Abnormal posture: Secondary | ICD-10-CM

## 2018-02-17 DIAGNOSIS — M542 Cervicalgia: Secondary | ICD-10-CM | POA: Diagnosis not present

## 2018-02-17 DIAGNOSIS — R252 Cramp and spasm: Secondary | ICD-10-CM

## 2018-02-17 DIAGNOSIS — E291 Testicular hypofunction: Secondary | ICD-10-CM | POA: Diagnosis not present

## 2018-02-17 DIAGNOSIS — M5442 Lumbago with sciatica, left side: Secondary | ICD-10-CM | POA: Diagnosis not present

## 2018-02-17 DIAGNOSIS — G8929 Other chronic pain: Secondary | ICD-10-CM

## 2018-02-17 DIAGNOSIS — M436 Torticollis: Secondary | ICD-10-CM | POA: Diagnosis not present

## 2018-02-17 NOTE — Therapy (Signed)
[place] [address], [place], [address] Phone: [phone]   Fax:  [phone]  Physical Therapy Treatment  Patient Details  Name: LENZY KERSCHNER MRN: [id number] Date of Birth: [birth year] Referring Provider: Lavone Orn   Encounter Date: 02/17/2018  PT End of Session - 02/17/18 1141    Visit Number  5    Date for PT Re-Evaluation  03/27/18    Authorization Type  MCR    PT Start Time  1110    PT Stop Time  1156    PT Time Calculation (min)  46 min    Activity Tolerance  Patient tolerated treatment well;Patient limited by pain    Behavior During Therapy  [facility] for tasks assessed/performed       Past Medical History:  Diagnosis Date  . Arthritis   . Benign localized prostatic hyperplasia with lower urinary tract symptoms (LUTS)   . Chronic back pain   . CKD (chronic kidney disease), stage III ([place])   . Diverticulosis   . DJD (degenerative joint disease)   . ED (erectile dysfunction)   . GERD (gastroesophageal reflux disease)   . Gout    per last attack over a year 2016 approx.  . Hemorrhoids   . Hyperlipidemia   . Hypertension   . Hypogonadism in male   . OSA on CPAP   . Rectal bleeding   . Wears glasses     Past Surgical History:  Procedure Laterality Date  . ANAL FISTULOTOMY  07-04-2003     [facility]   and Internal Hemorrhoid Banding  . CATARACT EXTRACTION W/ INTRAOCULAR LENS  IMPLANT, BILATERAL  2013 approx.  . COLONOSCOPY WITH PROPOFOL N/A 07/27/2013   Procedure: COLONOSCOPY WITH PROPOFOL;  Surgeon: Garlan Fair, MD;  [facility];  Service: Endoscopy;  Laterality: N/A;  . HEMORRHOID SURGERY N/A 10/02/2017   Procedure: HEMORRHOIDECTOMY, Anterior and Posterior;  Surgeon: Leighton Ruff, MD;  Location: [facility];  Service: General;  Laterality: N/A;  . INCISION AND DRAINAGE PERIRECTAL ABSCESS  03-22-2003     [facility]  . INGUINAL HERNIA REPAIR Right 08-14-2004    dr  Ninfa Linden [facility]  . LUMBAR DISECTOMY  03/ 2015        [facility])  . NASAL SINUS SURGERY  07/29/2001  . THUMB ARTHROTOMY/  REMOVAL MULTIPLE LOOSE BODIES/ SYNVECTOMY/  RESECTION MUCOID CYST Left 01-16-2010    dr sypher  [facility]  . TOE SURGERY Bilateral   . TRANSURETHRAL RESECTION OF PROSTATE  1983  . UVULOPALATOPHARYNGOPLASTY  1993    There were no vitals filed for this visit.  Subjective Assessment - 02/17/18 1110    Subjective  "Hanging in there"    Currently in Pain?  Yes    Pain Score  5     Pain Location  Neck    Pain Orientation  Right                      [facility] Adult PT Treatment/Exercise - 02/17/18 0001      Lumbar Exercises: Aerobic   Nustep  L3 x 6 min       Moist Heat Therapy   Number Minutes Moist Heat  15 Minutes    Moist Heat Location  Cervical      Electrical Stimulation   Electrical Stimulation Location  cervical area    Electrical Stimulation Action  IFC    Electrical Stimulation Parameters  supine    Electrical  Stimulation Goals  Pain      Manual Therapy   Manual Therapy  Soft tissue mobilization;Passive ROM;Manual Traction    Soft tissue mobilization  to the cervical paraspinals and the lateral neck area    Passive ROM  PROM all directions, some contract relax    Manual Traction  Manual distraction with gentle rotations               PT Short Term Goals - 01/28/18 7782      PT SHORT TERM GOAL #1   Title  He will be independent with initial HEP    Status  Achieved        PT Long Term Goals - 02/10/18 1652      PT LONG TERM GOAL #3   Title  He will improve  to 50 degrees cervical rotation to allow normal looking to side and behind     Status  On-going      PT LONG TERM GOAL #4   Title  He will be able to get out of bed without pain    Status  On-going            Plan - 02/17/18 1141    Clinical Impression Statement  Pt ~ 10 minutes late for today's treatment. Pt with very limited lumbar and cervical spine rotation.  Neck is very stiff noted with MT. During PROM pt often compensates lifting shoulders. Tenderness in upper traps at times.     Rehab Potential  Good    PT Frequency  2x / week    PT Duration  8 weeks    PT Treatment/Interventions  Manual techniques;Dry needling;Moist Heat;Ultrasound;Traction;Passive range of motion;Patient/family education;Electrical Stimulation;Cryotherapy;ADLs/Self Care Home Management;Neuromuscular re-education;Therapeutic exercise;Therapeutic activities    PT Next Visit Plan  see how todays treatments worked on him       Patient will benefit from skilled therapeutic intervention in order to improve the following deficits and impairments:  Decreased range of motion, Decreased activity tolerance, Postural dysfunction, Increased muscle spasms, Pain, Difficulty walking, Impaired flexibility, Decreased mobility, Decreased strength  Visit Diagnosis: Cervicalgia  Stiffness of cervical spine  Abnormal posture  Cramp and spasm  Chronic bilateral low back pain with left-sided sciatica     Problem List Patient Active Problem List   Diagnosis Date Noted  . HEARTBURN 05/09/2008  . ABDOMINAL BLOATING 05/09/2008  . HYPERTENSION 05/05/2008  . CONSTIPATION 05/05/2008  . ARTHRITIS 05/05/2008  . SLEEP APNEA 05/05/2008  . HERNIORRHAPHY, HX OF 05/05/2008    Scot Jun, PTA 02/17/2018, 11:44 AM  [place] [address] [place], [place], [address] Phone: [phone]   Fax:  [phone]  Name: KEIR VIERNES MRN: [id number] Date of Birth: [birth year]

## 2018-02-19 ENCOUNTER — Ambulatory Visit: Payer: Medicare Other | Admitting: Physical Therapy

## 2018-02-19 ENCOUNTER — Encounter: Payer: Self-pay | Admitting: Physical Therapy

## 2018-02-19 DIAGNOSIS — M542 Cervicalgia: Secondary | ICD-10-CM | POA: Diagnosis not present

## 2018-02-19 DIAGNOSIS — R293 Abnormal posture: Secondary | ICD-10-CM | POA: Diagnosis not present

## 2018-02-19 DIAGNOSIS — R252 Cramp and spasm: Secondary | ICD-10-CM | POA: Diagnosis not present

## 2018-02-19 DIAGNOSIS — M5442 Lumbago with sciatica, left side: Secondary | ICD-10-CM | POA: Diagnosis not present

## 2018-02-19 DIAGNOSIS — G8929 Other chronic pain: Secondary | ICD-10-CM | POA: Diagnosis not present

## 2018-02-19 DIAGNOSIS — M436 Torticollis: Secondary | ICD-10-CM

## 2018-02-19 NOTE — Therapy (Signed)
[address] [place], [place], [address] Phone: [phone]   Fax:  [phone]  Physical Therapy Treatment  Patient Details  Name: Marvin West MRN: [id number] Date of Birth: [birth year] Referring Provider: Lavone Orn   Encounter Date: 02/19/2018  PT End of Session - 02/19/18 1216    Visit Number  6    Date for PT Re-Evaluation  03/27/18    PT Start Time  1145    PT Stop Time  1230    PT Time Calculation (min)  45 min    Activity Tolerance  Patient tolerated treatment well    Behavior During Therapy  [facility] for tasks assessed/performed       Past Medical History:  Diagnosis Date  . Arthritis   . Benign localized prostatic hyperplasia with lower urinary tract symptoms (LUTS)   . Chronic back pain   . CKD (chronic kidney disease), stage III ([place])   . Diverticulosis   . DJD (degenerative joint disease)   . ED (erectile dysfunction)   . GERD (gastroesophageal reflux disease)   . Gout    per last attack over a year 2016 approx.  . Hemorrhoids   . Hyperlipidemia   . Hypertension   . Hypogonadism in male   . OSA on CPAP   . Rectal bleeding   . Wears glasses     Past Surgical History:  Procedure Laterality Date  . ANAL FISTULOTOMY  07-04-2003     [facility]   and Internal Hemorrhoid Banding  . CATARACT EXTRACTION W/ INTRAOCULAR LENS  IMPLANT, BILATERAL  2013 approx.  . COLONOSCOPY WITH PROPOFOL N/A 07/27/2013   Procedure: COLONOSCOPY WITH PROPOFOL;  Surgeon: Garlan Fair, MD;  [facility];  Service: Endoscopy;  Laterality: N/A;  . HEMORRHOID SURGERY N/A 10/02/2017   Procedure: HEMORRHOIDECTOMY, Anterior and Posterior;  Surgeon: Leighton Ruff, MD;  Location: [facility];  Service: General;  Laterality: N/A;  . INCISION AND DRAINAGE PERIRECTAL ABSCESS  03-22-2003     [facility]  . INGUINAL HERNIA REPAIR Right 08-14-2004    dr Ninfa Linden [facility]  . LUMBAR DISECTOMY  03/ 2015         [facility])  . NASAL SINUS SURGERY  07/29/2001  . THUMB ARTHROTOMY/  REMOVAL MULTIPLE LOOSE BODIES/ SYNVECTOMY/  RESECTION MUCOID CYST Left 01-16-2010    dr sypher  [facility]  . TOE SURGERY Bilateral   . TRANSURETHRAL RESECTION OF PROSTATE  1983  . UVULOPALATOPHARYNGOPLASTY  1993    There were no vitals filed for this visit.  Subjective Assessment - 02/19/18 1145    Subjective  "I feel better" Pt reports he got the pillow he needed    Currently in Pain?  No/denies    Pain Score  0-No pain                      [facility] Adult PT Treatment/Exercise - 02/19/18 0001      Neck Exercises: Supine   Neck Retraction  10 reps;3 secs 3 way      Lumbar Exercises: Aerobic   Nustep  L3 x 7 min       Moist Heat Therapy   Number Minutes Moist Heat  15 Minutes    Moist Heat Location  Cervical      Electrical Stimulation   Electrical Stimulation Location  cervical area    Electrical Stimulation Action  IFC    Electrical Stimulation Parameters  supine  Electrical Stimulation Goals  Pain      Manual Therapy   Manual Therapy  Soft tissue mobilization;Passive ROM;Manual Traction    Soft tissue mobilization  to the cervical parapsinals and the lateral neck area    Passive ROM  PROM all directions, some contract relax    Manual Traction  Manual distraction with gentle rotations               PT Short Term Goals - 01/28/18 7062      PT SHORT TERM GOAL #1   Title  He will be independent with initial HEP    Status  Achieved        PT Long Term Goals - 02/10/18 1652      PT LONG TERM GOAL #3   Title  He will improve  to 50 degrees cervical rotation to allow normal looking to side and behind     Status  On-going      PT LONG TERM GOAL #4   Title  He will be able to get out of bed without pain    Status  On-going            Plan - 02/19/18 1217    Clinical Impression Statement  Pt requested shorter treatment time. Pt neck remains very stiff with limited rotation.  Pt compensates during PROM often lifting shoulder with cervical rotation. Rigid posture with limited rotation with movement.    Rehab Potential  Good    PT Frequency  2x / week    PT Duration  8 weeks    PT Treatment/Interventions  Manual techniques;Dry needling;Moist Heat;Ultrasound;Traction;Passive range of motion;Patient/family education;Electrical Stimulation;Cryotherapy;ADLs/Self Care Home Management;Neuromuscular re-education;Therapeutic exercise;Therapeutic activities    PT Next Visit Plan  see how today's treatments worked on him       Patient will benefit from skilled therapeutic intervention in order to improve the following deficits and impairments:  Decreased range of motion, Decreased activity tolerance, Postural dysfunction, Increased muscle spasms, Pain, Difficulty walking, Impaired flexibility, Decreased mobility, Decreased strength  Visit Diagnosis: Cervicalgia  Stiffness of cervical spine  Abnormal posture     Problem List Patient Active Problem List   Diagnosis Date Noted  . HEARTBURN 05/09/2008  . ABDOMINAL BLOATING 05/09/2008  . HYPERTENSION 05/05/2008  . CONSTIPATION 05/05/2008  . ARTHRITIS 05/05/2008  . SLEEP APNEA 05/05/2008  . HERNIORRHAPHY, HX OF 05/05/2008    Scot Jun , PTA 02/19/2018, 12:21 PM  [place], [place], [address] Phone: [phone]   Fax:  [phone]  Name: Marvin West MRN: [id number] Date of Birth: 1934-03-17

## 2018-02-24 DIAGNOSIS — E291 Testicular hypofunction: Secondary | ICD-10-CM | POA: Diagnosis not present

## 2018-02-27 ENCOUNTER — Ambulatory Visit: Payer: Medicare Other | Attending: Internal Medicine | Admitting: Physical Therapy

## 2018-02-27 ENCOUNTER — Encounter: Payer: Self-pay | Admitting: Physical Therapy

## 2018-02-27 DIAGNOSIS — G8929 Other chronic pain: Secondary | ICD-10-CM

## 2018-02-27 DIAGNOSIS — M436 Torticollis: Secondary | ICD-10-CM | POA: Diagnosis not present

## 2018-02-27 DIAGNOSIS — M542 Cervicalgia: Secondary | ICD-10-CM

## 2018-02-27 DIAGNOSIS — M5442 Lumbago with sciatica, left side: Secondary | ICD-10-CM | POA: Insufficient documentation

## 2018-02-27 DIAGNOSIS — R252 Cramp and spasm: Secondary | ICD-10-CM | POA: Diagnosis not present

## 2018-02-27 DIAGNOSIS — R293 Abnormal posture: Secondary | ICD-10-CM | POA: Diagnosis not present

## 2018-02-27 NOTE — Therapy (Signed)
[place] [address] [place], [place], [address] Phone: [phone]   Fax:  [phone]  Physical Therapy Treatment  Patient Details  Name: DAYN BARICH MRN: [id number] Date of Birth: 28-Jul-1934 Referring Provider: Lavone Orn   Encounter Date: 02/27/2018  PT End of Session - 02/27/18 1214    Visit Number  7    Number of Visits  16    Date for PT Re-Evaluation  03/27/18    PT Start Time  1025    PT Stop Time  1100    PT Time Calculation (min)  35 min    Activity Tolerance  Patient tolerated treatment well    Behavior During Therapy  [facility] for tasks assessed/performed       Past Medical History:  Diagnosis Date   Arthritis    Benign localized prostatic hyperplasia with lower urinary tract symptoms (LUTS)    Chronic back pain    CKD (chronic kidney disease), stage III ([facility])    Diverticulosis    DJD (degenerative joint disease)    ED (erectile dysfunction)    GERD (gastroesophageal reflux disease)    Gout    per last attack over a year 2016 approx.   Hemorrhoids    Hyperlipidemia    Hypertension    Hypogonadism in male    OSA on CPAP    Rectal bleeding    Wears glasses     Past Surgical History:  Procedure Laterality Date   ANAL FISTULOTOMY  07-04-2003     [facility]   and Internal Hemorrhoid Banding   CATARACT EXTRACTION W/ INTRAOCULAR LENS  IMPLANT, BILATERAL  2013 approx.   COLONOSCOPY WITH PROPOFOL N/A 07/27/2013   Procedure: COLONOSCOPY WITH PROPOFOL;  Surgeon: Garlan Fair, MD;  [facility];  Service: Endoscopy;  Laterality: N/A;   HEMORRHOID SURGERY N/A 10/02/2017   Procedure: HEMORRHOIDECTOMY, Anterior and Posterior;  Surgeon: Leighton Ruff, MD;  Location: [facility];  Service: General;  Laterality: N/A;   INCISION AND DRAINAGE PERIRECTAL ABSCESS  03-22-2003     [facility]   INGUINAL HERNIA REPAIR Right 08-14-2004    dr Ninfa Linden [place]  03/ 2015        [facility])   NASAL SINUS SURGERY  07/29/2001   THUMB ARTHROTOMY/  REMOVAL MULTIPLE LOOSE BODIES/ SYNVECTOMY/  RESECTION MUCOID CYST Left 01-16-2010    dr sypher  [facility] Bilateral    [place]    There were no vitals filed for this visit.  Subjective Assessment - 02/27/18 1207    Subjective  Feeling better, but cont with stiffness and decreased ROM, particularly with C/S rotation to his Right.                      [place] Adult PT Treatment/Exercise - 02/27/18 0001      Neck Exercises: Seated   Other Seated Exercise  C/S retraction with self over pressure 10x5"    Other Seated Exercise  self nags/ snags with C/S rotation 10x ea b      Neck Exercises: Supine   Neck Retraction  10 reps;3 secs      Manual Therapy   Manual Therapy  Soft tissue mobilization;Myofascial release;Passive ROM;Manual Traction    Manual therapy comments  Manual lateral flexion with first rib mob bilaterally    Soft tissue mobilization  manual nags/ snags  during C/S rotation    Myofascial Release  suboccipital region with manual C/S flexion during release    Passive ROM  PROM all directions, some contract relax    Manual Traction  Manual distraction with gentle rotations               PT Short Term Goals - 01/28/18 5176      PT SHORT TERM GOAL #1   Title  He will be independent with initial HEP    Status  Achieved        PT Long Term Goals - 02/10/18 1652      PT LONG TERM GOAL #3   Title  He will improve  to 50 degrees cervical rotation to allow normal looking to side and behind     Status  On-going      PT LONG TERM GOAL #4   Title  He will be able to get out of bed without pain    Status  On-going            Plan - 02/27/18 1214    Clinical Impression Statement  Considerable soft tissue limitations to C/S ROM throughout all planes of motion.  Presents as  facetogenic boney limitations as well.  does not report any radicular type symptoms into UEs.  Does have decresaed ROM in all planes.  Improved soft tissue mobility following manual focused treatment interventions today.  Able to increase upper C/S flexion with manual C/S flexion during suboccipital release       Patient will benefit from skilled therapeutic intervention in order to improve the following deficits and impairments:     Visit Diagnosis: Cervicalgia  Stiffness of cervical spine  Abnormal posture  Cramp and spasm  Chronic bilateral low back pain with left-sided sciatica     Problem List Patient Active Problem List   Diagnosis Date Noted   HEARTBURN 05/09/2008   ABDOMINAL BLOATING 05/09/2008   HYPERTENSION 05/05/2008   CONSTIPATION 05/05/2008   ARTHRITIS 05/05/2008   SLEEP APNEA 05/05/2008   HERNIORRHAPHY, HX OF 05/05/2008    Olean Ree, PTA 02/27/2018, 12:19 PM  [place] [address] [place], [place], [address] Phone: [phone]   Fax:  [phone]  Name: HYDE SIRES MRN: [id number] Date of Birth: 1934/09/21

## 2018-03-02 ENCOUNTER — Ambulatory Visit: Payer: Medicare Other | Admitting: Physical Therapy

## 2018-03-02 ENCOUNTER — Encounter: Payer: Self-pay | Admitting: Physical Therapy

## 2018-03-02 DIAGNOSIS — N183 Chronic kidney disease, stage 3 (moderate): Secondary | ICD-10-CM | POA: Diagnosis not present

## 2018-03-02 DIAGNOSIS — R293 Abnormal posture: Secondary | ICD-10-CM

## 2018-03-02 DIAGNOSIS — M109 Gout, unspecified: Secondary | ICD-10-CM | POA: Diagnosis not present

## 2018-03-02 DIAGNOSIS — M436 Torticollis: Secondary | ICD-10-CM | POA: Diagnosis not present

## 2018-03-02 DIAGNOSIS — I129 Hypertensive chronic kidney disease with stage 1 through stage 4 chronic kidney disease, or unspecified chronic kidney disease: Secondary | ICD-10-CM | POA: Diagnosis not present

## 2018-03-02 DIAGNOSIS — M542 Cervicalgia: Secondary | ICD-10-CM | POA: Diagnosis not present

## 2018-03-02 DIAGNOSIS — G8929 Other chronic pain: Secondary | ICD-10-CM | POA: Diagnosis not present

## 2018-03-02 DIAGNOSIS — E291 Testicular hypofunction: Secondary | ICD-10-CM | POA: Diagnosis not present

## 2018-03-02 DIAGNOSIS — M5442 Lumbago with sciatica, left side: Secondary | ICD-10-CM | POA: Diagnosis not present

## 2018-03-02 DIAGNOSIS — R252 Cramp and spasm: Secondary | ICD-10-CM | POA: Diagnosis not present

## 2018-03-02 DIAGNOSIS — N400 Enlarged prostate without lower urinary tract symptoms: Secondary | ICD-10-CM | POA: Diagnosis not present

## 2018-03-02 NOTE — Therapy (Signed)
[place] [address] [place], [place], [address] Phone: [phone]   Fax:  [phone]  Physical Therapy Treatment  Patient Details  Name: Marvin West MRN: [id number] Date of Birth: Feb 20, 1934 Referring Provider: Lavone Orn   Encounter Date: 03/02/2018  PT End of Session - 03/02/18 1056    Visit Number  8    Date for PT Re-Evaluation  03/27/18    PT Start Time  1020    PT Stop Time  1100    PT Time Calculation (min)  40 min    Activity Tolerance  Patient tolerated treatment well    Behavior During Therapy  [facility] for tasks assessed/performed       Past Medical History:  Diagnosis Date  . Arthritis   . Benign localized prostatic hyperplasia with lower urinary tract symptoms (LUTS)   . Chronic back pain   . CKD (chronic kidney disease), stage III ([place])   . Diverticulosis   . DJD (degenerative joint disease)   . ED (erectile dysfunction)   . GERD (gastroesophageal reflux disease)   . Gout    per last attack over a year 2016 approx.  . Hemorrhoids   . Hyperlipidemia   . Hypertension   . Hypogonadism in male   . OSA on CPAP   . Rectal bleeding   . Wears glasses     Past Surgical History:  Procedure Laterality Date  . ANAL FISTULOTOMY  07-04-2003     [facility]   and Internal Hemorrhoid Banding  . CATARACT EXTRACTION W/ INTRAOCULAR LENS  IMPLANT, BILATERAL  2013 approx.  . COLONOSCOPY WITH PROPOFOL N/A 07/27/2013   Procedure: COLONOSCOPY WITH PROPOFOL;  Surgeon: Garlan Fair, MD;  [facility];  Service: Endoscopy;  Laterality: N/A;  . HEMORRHOID SURGERY N/A 10/02/2017   Procedure: HEMORRHOIDECTOMY, Anterior and Posterior;  Surgeon: Leighton Ruff, MD;  Location: [facility];  Service: General;  Laterality: N/A;  . INCISION AND DRAINAGE PERIRECTAL ABSCESS  03-22-2003     [facility]  . INGUINAL HERNIA REPAIR Right 08-14-2004    dr Ninfa Linden [facility]  . LUMBAR DISECTOMY  03/ 2015         [facility])  . NASAL SINUS SURGERY  07/29/2001  . THUMB ARTHROTOMY/  REMOVAL MULTIPLE LOOSE BODIES/ SYNVECTOMY/  RESECTION MUCOID CYST Left 01-16-2010    dr sypher  [facility]  . TOE SURGERY Bilateral   . TRANSURETHRAL RESECTION OF PROSTATE  1983  . UVULOPALATOPHARYNGOPLASTY  1993    There were no vitals filed for this visit.  Subjective Assessment - 03/02/18 1025    Subjective  "Same old i guess"    Currently in Pain?  No/denies    Pain Score  0-No pain                      [facility] Adult PT Treatment/Exercise - 03/02/18 0001      Lumbar Exercises: Aerobic   UBE (Upper Arm Bike)  L3 57frd/4rev    Nustep  L4  x 65min       Manual Therapy   Manual Therapy  Soft tissue mobilization;Myofascial release;Passive ROM;Manual Traction    Manual therapy comments  Manual lateral flexion wi    Myofascial Release  suboccipital region with manual C/S flexion during release    Passive ROM  PROM all directions, some contract relax    Manual Traction  Manual distraction with gentle rotations       Trigger  Point Dry Needling - 03/02/18 1211    Consent Given?  Yes    Muscles Treated Upper Body  Quadratus Lumborum;Gluteus minimus;Gluteus maximus patient reported feeling better after             PT Short Term Goals - 01/28/18 0017      PT SHORT TERM GOAL #1   Title  He will be independent with initial HEP    Status  Achieved        PT Long Term Goals - 02/10/18 1652      PT LONG TERM GOAL #3   Title  He will improve  to 50 degrees cervical rotation to allow normal looking to side and behind     Status  On-going      PT LONG TERM GOAL #4   Title  He will be able to get out of bed without pain    Status  On-going            Plan - 03/02/18 1056    Clinical Impression Statement  Again focuses treatment on MT, significant soft tissue limitation remain. Pt remain very guarded with rotation, often turn shoulders ins tread of cervical spine. No radicular type symptoms into  UE's.    Rehab Potential  Good    PT Frequency  2x / week    PT Duration  8 weeks    PT Treatment/Interventions  Manual techniques;Dry needling;Moist Heat;Ultrasound;Traction;Passive range of motion;Patient/family education;Electrical Stimulation;Cryotherapy;ADLs/Self Care Home Management;Neuromuscular re-education;Therapeutic exercise;Therapeutic activities    PT Next Visit Plan  see how todays treatments worked on him       Patient will benefit from skilled therapeutic intervention in order to improve the following deficits and impairments:  Decreased range of motion, Decreased activity tolerance, Postural dysfunction, Increased muscle spasms, Pain, Difficulty walking, Impaired flexibility, Decreased mobility, Decreased strength  Visit Diagnosis: Stiffness of cervical spine  Cervicalgia  Abnormal posture     Problem List Patient Active Problem List   Diagnosis Date Noted  . HEARTBURN 05/09/2008  . ABDOMINAL BLOATING 05/09/2008  . HYPERTENSION 05/05/2008  . CONSTIPATION 05/05/2008  . ARTHRITIS 05/05/2008  . SLEEP APNEA 05/05/2008  . HERNIORRHAPHY, HX OF 05/05/2008    Scot Jun 03/02/2018, 12:34 PM  [place] [address] [place] [place], [place], [address] Phone: [phone]   Fax:  [phone]  Name: Marvin West MRN: [id number] Date of Birth: 1934/08/15

## 2018-03-05 ENCOUNTER — Ambulatory Visit: Payer: Medicare Other | Admitting: Physical Therapy

## 2018-03-05 ENCOUNTER — Encounter: Payer: Self-pay | Admitting: Physical Therapy

## 2018-03-05 DIAGNOSIS — M436 Torticollis: Secondary | ICD-10-CM

## 2018-03-05 DIAGNOSIS — R293 Abnormal posture: Secondary | ICD-10-CM | POA: Diagnosis not present

## 2018-03-05 DIAGNOSIS — R252 Cramp and spasm: Secondary | ICD-10-CM | POA: Diagnosis not present

## 2018-03-05 DIAGNOSIS — G8929 Other chronic pain: Secondary | ICD-10-CM | POA: Diagnosis not present

## 2018-03-05 DIAGNOSIS — M542 Cervicalgia: Secondary | ICD-10-CM | POA: Diagnosis not present

## 2018-03-05 DIAGNOSIS — M5442 Lumbago with sciatica, left side: Secondary | ICD-10-CM | POA: Diagnosis not present

## 2018-03-05 NOTE — Therapy (Signed)
[place], [place], [address] Phone: [phone]   Fax:  [phone]  Physical Therapy Treatment  Patient Details  Name: Marvin West MRN: [id number] Date of Birth: [birth year] Referring Provider: Lavone Orn   Encounter Date: 03/05/2018  PT End of Session - 03/05/18 1055    Visit Number  9    Date for PT Re-Evaluation  03/27/18    PT Start Time  1962    PT Stop Time  1100    PT Time Calculation (min)  45 min    Activity Tolerance  Patient tolerated treatment well    Behavior During Therapy  [facility] for tasks assessed/performed       Past Medical History:  Diagnosis Date  . Arthritis   . Benign localized prostatic hyperplasia with lower urinary tract symptoms (LUTS)   . Chronic back pain   . CKD (chronic kidney disease), stage III ([place])   . Diverticulosis   . DJD (degenerative joint disease)   . ED (erectile dysfunction)   . GERD (gastroesophageal reflux disease)   . Gout    per last attack over a year 2016 approx.  . Hemorrhoids   . Hyperlipidemia   . Hypertension   . Hypogonadism in male   . OSA on CPAP   . Rectal bleeding   . Wears glasses     Past Surgical History:  Procedure Laterality Date  . ANAL FISTULOTOMY  07-04-2003     [facility]   and Internal Hemorrhoid Banding  . CATARACT EXTRACTION W/ INTRAOCULAR LENS  IMPLANT, BILATERAL  2013 approx.  . COLONOSCOPY WITH PROPOFOL N/A 07/27/2013   Procedure: COLONOSCOPY WITH PROPOFOL;  Surgeon: Garlan Fair, MD;  [facility];  Service: Endoscopy;  Laterality: N/A;  . HEMORRHOID SURGERY N/A 10/02/2017   Procedure: HEMORRHOIDECTOMY, Anterior and Posterior;  Surgeon: Leighton Ruff, MD;  Location: [facility];  Service: General;  Laterality: N/A;  . INCISION AND DRAINAGE PERIRECTAL ABSCESS  03-22-2003     [facility]  . INGUINAL HERNIA REPAIR Right 08-14-2004    dr Ninfa Linden [facility]  . LUMBAR DISECTOMY  03/ 2015         [facility])  . NASAL SINUS SURGERY  07/29/2001  . THUMB ARTHROTOMY/  REMOVAL MULTIPLE LOOSE BODIES/ SYNVECTOMY/  RESECTION MUCOID CYST Left 01-16-2010    dr sypher  [facility]  . TOE SURGERY Bilateral   . TRANSURETHRAL RESECTION OF PROSTATE  1983  . UVULOPALATOPHARYNGOPLASTY  1993    There were no vitals filed for this visit.  Subjective Assessment - 03/05/18 1019    Subjective  "Pretty good"    Currently in Pain?  Yes    Pain Score  5     Pain Location  Neck    Pain Orientation  Mid                      [facility] Adult PT Treatment/Exercise - 03/05/18 0001      Neck Exercises: Seated   Other Seated Exercise  AAROM cervical spine L & R multiple reps      Lumbar Exercises: Aerobic   UBE (Upper Arm Bike)  L3 49frd/3rev    Nustep  L4  x 41min       Manual Therapy   Manual Therapy  Soft tissue mobilization;Myofascial release;Passive ROM;Manual Traction    Manual therapy comments  Manual lateral flexion wi    Myofascial Release  suboccipital region with manual  C/S flexion during release    Passive ROM  PROM all directions, some contract relax    Manual Traction  Manual distraction with gentle rotations               PT Short Term Goals - 01/28/18 1610      PT SHORT TERM GOAL #1   Title  He will be independent with initial HEP    Status  Achieved        PT Long Term Goals - 02/10/18 1652      PT LONG TERM GOAL #3   Title  He will improve  to 50 degrees cervical rotation to allow normal looking to side and behind     Status  On-going      PT LONG TERM GOAL #4   Title  He will be able to get out of bed without pain    Status  On-going            Plan - 03/05/18 1056    Clinical Impression Statement  Pt continues to be very guarded with cervical rotational movements. Pt often turns shoulder and body to look to the side. No radicular type symptoms into UE's. Soft tissue and guarding  limits functional cervical rotation.    Rehab Potential  Good    PT  Frequency  2x / week    PT Duration  8 weeks    PT Treatment/Interventions  Manual techniques;Dry needling;Moist Heat;Ultrasound;Traction;Passive range of motion;Patient/family education;Electrical Stimulation;Cryotherapy;ADLs/Self Care Home Management;Neuromuscular re-education;Therapeutic exercise;Therapeutic activities    PT Next Visit Plan  see how todays treatments worked on him       Patient will benefit from skilled therapeutic intervention in order to improve the following deficits and impairments:  Decreased range of motion, Decreased activity tolerance, Postural dysfunction, Increased muscle spasms, Pain, Difficulty walking, Impaired flexibility, Decreased mobility, Decreased strength  Visit Diagnosis: Stiffness of cervical spine  Cervicalgia  Abnormal posture     Problem List Patient Active Problem List   Diagnosis Date Noted  . HEARTBURN 05/09/2008  . ABDOMINAL BLOATING 05/09/2008  . HYPERTENSION 05/05/2008  . CONSTIPATION 05/05/2008  . ARTHRITIS 05/05/2008  . SLEEP APNEA 05/05/2008  . HERNIORRHAPHY, HX OF 05/05/2008    Scot Jun, PTA 03/05/2018, 11:03 AM  [address] [place], [place], [address] Phone: [phone]   Fax:  [phone]  Name: Marvin West MRN: [id number] Date of Birth: 23-Mar-1934

## 2018-03-09 ENCOUNTER — Ambulatory Visit: Payer: Medicare Other | Admitting: Physical Therapy

## 2018-03-09 DIAGNOSIS — G8929 Other chronic pain: Secondary | ICD-10-CM | POA: Diagnosis not present

## 2018-03-09 DIAGNOSIS — M436 Torticollis: Secondary | ICD-10-CM

## 2018-03-09 DIAGNOSIS — R252 Cramp and spasm: Secondary | ICD-10-CM | POA: Diagnosis not present

## 2018-03-09 DIAGNOSIS — M5442 Lumbago with sciatica, left side: Secondary | ICD-10-CM | POA: Diagnosis not present

## 2018-03-09 DIAGNOSIS — M542 Cervicalgia: Secondary | ICD-10-CM

## 2018-03-09 DIAGNOSIS — R293 Abnormal posture: Secondary | ICD-10-CM | POA: Diagnosis not present

## 2018-03-09 NOTE — Therapy (Signed)
[place], [place], [address] Phone: [phone]   Fax:  [phone]  Physical Therapy Treatment  Patient Details  Name: Marvin West MRN: [id number] Date of Birth: 1934/09/20 Referring Provider: Lavone Orn   Encounter Date: 03/09/2018  PT End of Session - 03/09/18 1454    Visit Number  10    Date for PT Re-Evaluation  03/27/18    PT Start Time  1447    PT Stop Time  1545    PT Time Calculation (min)  58 min    Activity Tolerance  Patient tolerated treatment well    Behavior During Therapy  [facility] for tasks assessed/performed       Past Medical History:  Diagnosis Date  . Arthritis   . Benign localized prostatic hyperplasia with lower urinary tract symptoms (LUTS)   . Chronic back pain   . CKD (chronic kidney disease), stage III ([place])   . Diverticulosis   . DJD (degenerative joint disease)   . ED (erectile dysfunction)   . GERD (gastroesophageal reflux disease)   . Gout    per last attack over a year 2016 approx.  . Hemorrhoids   . Hyperlipidemia   . Hypertension   . Hypogonadism in male   . OSA on CPAP   . Rectal bleeding   . Wears glasses     Past Surgical History:  Procedure Laterality Date  . ANAL FISTULOTOMY  07-04-2003     [facility]   and Internal Hemorrhoid Banding  . CATARACT EXTRACTION W/ INTRAOCULAR LENS  IMPLANT, BILATERAL  2013 approx.  . COLONOSCOPY WITH PROPOFOL N/A 07/27/2013   Procedure: COLONOSCOPY WITH PROPOFOL;  Surgeon: Garlan Fair, MD;  [facility];  Service: Endoscopy;  Laterality: N/A;  . HEMORRHOID SURGERY N/A 10/02/2017   Procedure: HEMORRHOIDECTOMY, Anterior and Posterior;  Surgeon: Leighton Ruff, MD;  Location: [facility];  Service: General;  Laterality: N/A;  . INCISION AND DRAINAGE PERIRECTAL ABSCESS  03-22-2003     [facility]  . INGUINAL HERNIA REPAIR Right 08-14-2004    dr Ninfa Linden [facility]  . LUMBAR DISECTOMY  03/ 2015         [facility])  . NASAL SINUS SURGERY  07/29/2001  . THUMB ARTHROTOMY/  REMOVAL MULTIPLE LOOSE BODIES/ SYNVECTOMY/  RESECTION MUCOID CYST Left 01-16-2010    dr sypher  [facility]  . TOE SURGERY Bilateral   . TRANSURETHRAL RESECTION OF PROSTATE  1983  . UVULOPALATOPHARYNGOPLASTY  1993    There were no vitals filed for this visit.  Subjective Assessment - 03/09/18 1450    Subjective  Pt. reporting R-sided neck stiffness today.      Patient Stated Goals  less pain and stiffness    Currently in Pain?  Yes    Pain Score  4     Pain Location  Neck    Pain Orientation  Mid;Right    Pain Descriptors / Indicators  Tightness    Pain Type  Chronic pain         [facility] PT Assessment - 03/09/18 1504      AROM   Cervical Flexion  33    Cervical Extension  27    Cervical - Right Rotation  27    Cervical - Left Rotation  36                  [facility] Adult PT Treatment/Exercise - 03/09/18 1533      Lumbar Exercises:  Aerobic   Nustep  L4  x 36min       Electrical Stimulation   Electrical Stimulation Location  cervical area    Electrical Stimulation Action  IFC    Electrical Stimulation Parameters  supine     Electrical Stimulation Goals  Pain      Manual Therapy   Manual Therapy  Soft tissue mobilization;Myofascial release;Passive ROM;Manual Traction    Manual therapy comments  Hooklying     Myofascial Release  TPR to B UT, LS; pt. ttp; some relief noted following     Passive ROM  PROM all directions with gentle LS, UT stretch + manual traction x 30 sec each way     Manual Traction  Manual cerv traction 2 x 30 sec                PT Short Term Goals - 01/28/18 3762      PT SHORT TERM GOAL #1   Title  He will be independent with initial HEP    Status  Achieved        PT Long Term Goals - 03/09/18 1522      PT LONG TERM GOAL #3   Title  He will improve  to 50 degrees cervical rotation to allow normal looking to side and behind     Status  On-going      PT LONG TERM  GOAL #4   Title  He will be able to get out of bed without pain    Status  On-going            Plan - 03/09/18 1457    Clinical Impression Statement  Marvin West reporting he has felt most benefit in therapy from DN at this point.  Able to demo slight improvement in cervical AROM today however continues with tightness/tension in upper shoulder/cervical musculature.  Some improvement in cervical ROM and reported comfort following manual work today.  Ended with E-stim/moist heat per pt. request to reduce tone/pain.      PT Treatment/Interventions  Manual techniques;Dry needling;Moist Heat;Ultrasound;Traction;Passive range of motion;Patient/family education;Electrical Stimulation;Cryotherapy;ADLs/Self Care Home Management;Neuromuscular re-education;Therapeutic exercise;Therapeutic activities    Consulted and Agree with Plan of Care  Patient       Patient will benefit from skilled therapeutic intervention in order to improve the following deficits and impairments:  Decreased range of motion, Decreased activity tolerance, Postural dysfunction, Increased muscle spasms, Pain, Difficulty walking, Impaired flexibility, Decreased mobility, Decreased strength  Visit Diagnosis: Stiffness of cervical spine  Cervicalgia  Abnormal posture     Problem List Patient Active Problem List   Diagnosis Date Noted  . HEARTBURN 05/09/2008  . ABDOMINAL BLOATING 05/09/2008  . HYPERTENSION 05/05/2008  . CONSTIPATION 05/05/2008  . ARTHRITIS 05/05/2008  . SLEEP APNEA 05/05/2008  . HERNIORRHAPHY, HX OF 05/05/2008    Bess Harvest, PTA 03/09/18 6:04 PM    [address] [place], [place], [address] Phone: [phone]   Fax:  [phone]  Name: Marvin West MRN: [id number] Date of Birth: 12-25-1933

## 2018-03-12 ENCOUNTER — Ambulatory Visit: Payer: Medicare Other | Admitting: Physical Therapy

## 2018-03-12 ENCOUNTER — Encounter: Payer: Self-pay | Admitting: Physical Therapy

## 2018-03-12 DIAGNOSIS — R252 Cramp and spasm: Secondary | ICD-10-CM

## 2018-03-12 DIAGNOSIS — M5442 Lumbago with sciatica, left side: Secondary | ICD-10-CM

## 2018-03-12 DIAGNOSIS — M436 Torticollis: Secondary | ICD-10-CM | POA: Diagnosis not present

## 2018-03-12 DIAGNOSIS — M542 Cervicalgia: Secondary | ICD-10-CM | POA: Diagnosis not present

## 2018-03-12 DIAGNOSIS — R293 Abnormal posture: Secondary | ICD-10-CM

## 2018-03-12 DIAGNOSIS — G8929 Other chronic pain: Secondary | ICD-10-CM | POA: Diagnosis not present

## 2018-03-12 NOTE — Therapy (Signed)
[place] [address] [place], [place], [address] Phone: [phone]   Fax:  [phone]  Physical Therapy Treatment  Patient Details  Name: Marvin West MRN: [id number] Date of Birth: 22-Dec-1934 Referring Provider: Lavone Orn   Encounter Date: 03/12/2018  PT End of Session - 03/12/18 1404    Visit Number  11    Number of Visits  16    Date for PT Re-Evaluation  03/27/18    Authorization Type  MCR    PT Start Time  1245    PT Stop Time  1335    PT Time Calculation (min)  50 min    Activity Tolerance  Patient tolerated treatment well    Behavior During Therapy  [facility] for tasks assessed/performed       Past Medical History:  Diagnosis Date  . Arthritis   . Benign localized prostatic hyperplasia with lower urinary tract symptoms (LUTS)   . Chronic back pain   . CKD (chronic kidney disease), stage III ([place])   . Diverticulosis   . DJD (degenerative joint disease)   . ED (erectile dysfunction)   . GERD (gastroesophageal reflux disease)   . Gout    per last attack over a year 2016 approx.  . Hemorrhoids   . Hyperlipidemia   . Hypertension   . Hypogonadism in male   . OSA on CPAP   . Rectal bleeding   . Wears glasses     Past Surgical History:  Procedure Laterality Date  . ANAL FISTULOTOMY  07-04-2003     [facility]   and Internal Hemorrhoid Banding  . CATARACT EXTRACTION W/ INTRAOCULAR LENS  IMPLANT, BILATERAL  2013 approx.  . COLONOSCOPY WITH PROPOFOL N/A 07/27/2013   Procedure: COLONOSCOPY WITH PROPOFOL;  Surgeon: Garlan Fair, MD;  [facility];  Service: Endoscopy;  Laterality: N/A;  . HEMORRHOID SURGERY N/A 10/02/2017   Procedure: HEMORRHOIDECTOMY, Anterior and Posterior;  Surgeon: Leighton Ruff, MD;  Location: [facility];  Service: General;  Laterality: N/A;  . INCISION AND DRAINAGE PERIRECTAL ABSCESS  03-22-2003     [facility]  . INGUINAL HERNIA REPAIR Right 08-14-2004    dr  Ninfa Linden [facility]  . LUMBAR DISECTOMY  03/ 2015        [facility])  . NASAL SINUS SURGERY  07/29/2001  . THUMB ARTHROTOMY/  REMOVAL MULTIPLE LOOSE BODIES/ SYNVECTOMY/  RESECTION MUCOID CYST Left 01-16-2010    dr sypher  [facility]  . TOE SURGERY Bilateral   . TRANSURETHRAL RESECTION OF PROSTATE  1983  . UVULOPALATOPHARYNGOPLASTY  1993    There were no vitals filed for this visit.  Subjective Assessment - 03/12/18 1355    Subjective  Pt still reporting more neck stiffness on the right.     Limitations  Lifting    Diagnostic tests  xrays;    Patient Stated Goals  less pain and stiffness    Currently in Pain?  Yes    Pain Score  4     Pain Location  Neck    Pain Orientation  Right;Left    Pain Descriptors / Indicators  Tightness;Discomfort    Pain Type  Chronic pain    Pain Onset  More than a month ago    Pain Frequency  Constant                      [facility] Adult PT Treatment/Exercise - 03/12/18 0001      Exercises  Exercises  Neck      Moist Heat Therapy   Number Minutes Moist Heat  10 Minutes    Moist Heat Location  Cervical      Manual Therapy   Manual Therapy  Joint mobilization    Manual therapy comments  supine and sitting positions for UT trigger point release.     Joint Mobilization  grade 2-3 cervical spine mobs, 1st rib mobs,     Myofascial Release  TPR to B UT, LS,     Passive ROM  PROM all directions with gentle LS, UT stretch + manual traction x 30 sec each way     Manual Traction  Manual cerv traction 2 x 30 sec              PT Education - 03/12/18 1401    Education Details  HEP    Person(s) Educated  Patient    Methods  Explanation;Demonstration;Verbal cues    Comprehension  Verbalized understanding;Returned demonstration;Need further instruction       PT Short Term Goals - 01/28/18 6213      PT SHORT TERM GOAL #1   Title  He will be independent with initial HEP    Status  Achieved        PT Long Term Goals - 03/12/18 1409      PT  LONG TERM GOAL #1   Title  He will be independent with advanced HEP and safe with a gym    Status  On-going      PT LONG TERM GOAL #2   Title  He will report pain decreased 75% or more with   rotation to look for traffic    Status  On-going      PT LONG TERM GOAL #3   Title  He will improve  to 50 degrees cervical rotation to allow normal looking to side and behind     Status  On-going      PT LONG TERM GOAL #4   Title  He will be able to get out of bed without pain    Status  On-going      PT LONG TERM GOAL #5   Title  increase lumbar ROM 25%    Period  Weeks    Status  New            Plan - 03/12/18 1405    Clinical Impression Statement  Pt arriving to therapy reporting stiffness in his bilateral UT with right > left. Pt with pain reported as 4-5/10 more discomfort. Pt reporting benefits from Dry Needling, Pt tolerating DN, STW, PROM, manual traction, cervical mobs and modalities well today. Continue skilled PT as pt tolerates.     Rehab Potential  Good    PT Frequency  2x / week    PT Duration  8 weeks    PT Treatment/Interventions  Manual techniques;Dry needling;Moist Heat;Ultrasound;Traction;Passive range of motion;Patient/family education;Electrical Stimulation;Cryotherapy;ADLs/Self Care Home Management;Neuromuscular re-education;Therapeutic exercise;Therapeutic activities    PT Next Visit Plan  cervical rotation stretch with towel    PT Home Exercise Plan  postural exercises , ROM, rotation and extension snags with towel , isometrics neck     Consulted and Agree with Plan of Care  Patient       Patient will benefit from skilled therapeutic intervention in order to improve the following deficits and impairments:  Decreased range of motion, Decreased activity tolerance, Postural dysfunction, Increased muscle spasms, Pain, Difficulty walking, Impaired flexibility, Decreased mobility, Decreased strength  Visit Diagnosis: Stiffness of cervical  spine  Cervicalgia  Abnormal posture  Cramp and spasm  Chronic bilateral low back pain with left-sided sciatica     Problem List Patient Active Problem List   Diagnosis Date Noted  . HEARTBURN 05/09/2008  . ABDOMINAL BLOATING 05/09/2008  . HYPERTENSION 05/05/2008  . CONSTIPATION 05/05/2008  . ARTHRITIS 05/05/2008  . SLEEP APNEA 05/05/2008  . HERNIORRHAPHY, HX OF 05/05/2008    Oretha Caprice, MPT 03/12/2018, 2:10 PM  [address] [place], [place], [address] Phone: [phone]   Fax:  [phone]  Name: Marvin West MRN: [id number] Date of Birth: Apr 04, 1934

## 2018-03-16 DIAGNOSIS — H00024 Hordeolum internum left upper eyelid: Secondary | ICD-10-CM | POA: Diagnosis not present

## 2018-03-17 ENCOUNTER — Encounter: Payer: Self-pay | Admitting: Physical Therapy

## 2018-03-17 ENCOUNTER — Ambulatory Visit: Payer: Medicare Other | Admitting: Physical Therapy

## 2018-03-17 DIAGNOSIS — M542 Cervicalgia: Secondary | ICD-10-CM

## 2018-03-17 DIAGNOSIS — M436 Torticollis: Secondary | ICD-10-CM | POA: Diagnosis not present

## 2018-03-17 DIAGNOSIS — R293 Abnormal posture: Secondary | ICD-10-CM | POA: Diagnosis not present

## 2018-03-17 DIAGNOSIS — G8929 Other chronic pain: Secondary | ICD-10-CM

## 2018-03-17 DIAGNOSIS — R252 Cramp and spasm: Secondary | ICD-10-CM

## 2018-03-17 DIAGNOSIS — M5442 Lumbago with sciatica, left side: Secondary | ICD-10-CM

## 2018-03-17 NOTE — Therapy (Signed)
[place], [place], [address] Phone: [phone]   Fax:  [phone]  Physical Therapy Treatment  Patient Details  Name: Marvin West MRN: [id number] Date of Birth: September 18, 1934 Referring Provider: Lavone Orn   Encounter Date: 03/17/2018  PT End of Session - 03/17/18 1208    Visit Number  12    Date for PT Re-Evaluation  03/27/18    PT Start Time  6962    PT Stop Time  1100    PT Time Calculation (min)  45 min    Activity Tolerance  Patient tolerated treatment well    Behavior During Therapy  [facility] for tasks assessed/performed       Past Medical History:  Diagnosis Date  . Arthritis   . Benign localized prostatic hyperplasia with lower urinary tract symptoms (LUTS)   . Chronic back pain   . CKD (chronic kidney disease), stage III ([place])   . Diverticulosis   . DJD (degenerative joint disease)   . ED (erectile dysfunction)   . GERD (gastroesophageal reflux disease)   . Gout    per last attack over a year 2016 approx.  . Hemorrhoids   . Hyperlipidemia   . Hypertension   . Hypogonadism in male   . OSA on CPAP   . Rectal bleeding   . Wears glasses     Past Surgical History:  Procedure Laterality Date  . ANAL FISTULOTOMY  07-04-2003     [facility]   and Internal Hemorrhoid Banding  . CATARACT EXTRACTION W/ INTRAOCULAR LENS  IMPLANT, BILATERAL  2013 approx.  . COLONOSCOPY WITH PROPOFOL N/A 07/27/2013   Procedure: COLONOSCOPY WITH PROPOFOL;  Surgeon: Garlan Fair, MD;  [facility];  Service: Endoscopy;  Laterality: N/A;  . HEMORRHOID SURGERY N/A 10/02/2017   Procedure: HEMORRHOIDECTOMY, Anterior and Posterior;  Surgeon: Leighton Ruff, MD;  Location: [facility];  Service: General;  Laterality: N/A;  . INCISION AND DRAINAGE PERIRECTAL ABSCESS  03-22-2003     [facility]  . INGUINAL HERNIA REPAIR Right 08-14-2004    dr Ninfa Linden [facility]  . LUMBAR DISECTOMY  03/ 2015         [facility])  . NASAL SINUS SURGERY  07/29/2001  . THUMB ARTHROTOMY/  REMOVAL MULTIPLE LOOSE BODIES/ SYNVECTOMY/  RESECTION MUCOID CYST Left 01-16-2010    dr sypher  [facility]  . TOE SURGERY Bilateral   . TRANSURETHRAL RESECTION OF PROSTATE  1983  . UVULOPALATOPHARYNGOPLASTY  1993    There were no vitals filed for this visit.  Subjective Assessment - 03/17/18 1206    Subjective  Patient reports that he feel like he is moving better and having less stiffness    Currently in Pain?  Yes    Pain Score  4     Pain Location  Neck    Aggravating Factors   turning head    Pain Relieving Factors  the treatment                 No data recorded       [facility] Adult PT Treatment/Exercise - 03/17/18 0001      Moist Heat Therapy   Number Minutes Moist Heat  15 Minutes    Moist Heat Location  Cervical;Lumbar Spine      Electrical Stimulation   Electrical Stimulation Location  neck and low back     Electrical Stimulation Action  pre mod    Electrical Stimulation Parameters  supine  Electrical Stimulation Goals  Pain      Manual Therapy   Manual Therapy  Joint mobilization    Manual therapy comments  supine and sitting positions for UT trigger point release.     Joint Mobilization  grade 2-3 cervical spine mobs, 1st rib mobs,     Myofascial Release  TPR to B UT, LS,     Passive ROM  PROM all directions with gentle LS, UT stretch + manual traction x 30 sec each way     Manual Traction  Manual cerv traction 2 x 30 sec        Trigger Point Dry Needling - 03/17/18 1208    Consent Given?  Yes             PT Short Term Goals - 01/28/18 4098      PT SHORT TERM GOAL #1   Title  He will be independent with initial HEP    Status  Achieved        PT Long Term Goals - 03/12/18 1409      PT LONG TERM GOAL #1   Title  He will be independent with advanced HEP and safe with a gym    Status  On-going      PT LONG TERM GOAL #2   Title  He will report pain decreased 75% or more  with   rotation to look for traffic    Status  On-going      PT LONG TERM GOAL #3   Title  He will improve  to 50 degrees cervical rotation to allow normal looking to side and behind     Status  On-going      PT LONG TERM GOAL #4   Title  He will be able to get out of bed without pain    Status  On-going      PT LONG TERM GOAL #5   Title  increase lumbar ROM 25%    Period  Weeks    Status  New            Plan - 03/17/18 1209    Clinical Impression Statement  Patient demonstrated increased ROM when turning head and looking up, he had less palpable spasms but still tight.  Reports good responses from treatment    PT Next Visit Plan  cervical rotation stretch with towel    Consulted and Agree with Plan of Care  Patient       Patient will benefit from skilled therapeutic intervention in order to improve the following deficits and impairments:  Decreased range of motion, Decreased activity tolerance, Postural dysfunction, Increased muscle spasms, Pain, Difficulty walking, Impaired flexibility, Decreased mobility, Decreased strength  Visit Diagnosis: Stiffness of cervical spine  Cervicalgia  Abnormal posture  Cramp and spasm  Chronic bilateral low back pain with left-sided sciatica     Problem List Patient Active Problem List   Diagnosis Date Noted  . HEARTBURN 05/09/2008  . ABDOMINAL BLOATING 05/09/2008  . HYPERTENSION 05/05/2008  . CONSTIPATION 05/05/2008  . ARTHRITIS 05/05/2008  . SLEEP APNEA 05/05/2008  . HERNIORRHAPHY, HX OF 05/05/2008    Sumner Boast., PT 03/17/2018, 12:13 PM  [place] [address] [place], [place], [address] Phone: [phone]   Fax:  [phone]  Name: Marvin West MRN: [id number] Date of Birth: 1934/07/19

## 2018-03-18 DIAGNOSIS — J320 Chronic maxillary sinusitis: Secondary | ICD-10-CM | POA: Diagnosis not present

## 2018-03-18 DIAGNOSIS — G4730 Sleep apnea, unspecified: Secondary | ICD-10-CM | POA: Diagnosis not present

## 2018-03-18 DIAGNOSIS — J322 Chronic ethmoidal sinusitis: Secondary | ICD-10-CM | POA: Diagnosis not present

## 2018-03-18 DIAGNOSIS — R0683 Snoring: Secondary | ICD-10-CM | POA: Diagnosis not present

## 2018-03-18 DIAGNOSIS — H6122 Impacted cerumen, left ear: Secondary | ICD-10-CM | POA: Diagnosis not present

## 2018-03-19 ENCOUNTER — Encounter: Payer: Self-pay | Admitting: Physical Therapy

## 2018-03-19 ENCOUNTER — Ambulatory Visit: Payer: Medicare Other | Admitting: Physical Therapy

## 2018-03-19 DIAGNOSIS — M542 Cervicalgia: Secondary | ICD-10-CM

## 2018-03-19 DIAGNOSIS — G8929 Other chronic pain: Secondary | ICD-10-CM | POA: Diagnosis not present

## 2018-03-19 DIAGNOSIS — M5442 Lumbago with sciatica, left side: Secondary | ICD-10-CM | POA: Diagnosis not present

## 2018-03-19 DIAGNOSIS — M436 Torticollis: Secondary | ICD-10-CM | POA: Diagnosis not present

## 2018-03-19 DIAGNOSIS — R293 Abnormal posture: Secondary | ICD-10-CM | POA: Diagnosis not present

## 2018-03-19 DIAGNOSIS — R252 Cramp and spasm: Secondary | ICD-10-CM

## 2018-03-19 NOTE — Therapy (Signed)
[place], [place], [address] Phone: [phone]   Fax:  [phone]  Physical Therapy Treatment  Patient Details  Name: Marvin West MRN: [id number] Date of Birth: 03/23/1934 Referring Provider: Lavone Orn   Encounter Date: 03/19/2018  PT End of Session - 03/19/18 1058    Visit Number  13    Date for PT Re-Evaluation  03/27/18    Authorization Type  MCR    PT Start Time  1018    PT Stop Time  1112    PT Time Calculation (min)  54 min    Activity Tolerance  Patient tolerated treatment well    Behavior During Therapy  [facility] for tasks assessed/performed       Past Medical History:  Diagnosis Date  . Arthritis   . Benign localized prostatic hyperplasia with lower urinary tract symptoms (LUTS)   . Chronic back pain   . CKD (chronic kidney disease), stage III ([place])   . Diverticulosis   . DJD (degenerative joint disease)   . ED (erectile dysfunction)   . GERD (gastroesophageal reflux disease)   . Gout    per last attack over a year 2016 approx.  . Hemorrhoids   . Hyperlipidemia   . Hypertension   . Hypogonadism in male   . OSA on CPAP   . Rectal bleeding   . Wears glasses     Past Surgical History:  Procedure Laterality Date  . ANAL FISTULOTOMY  07-04-2003     [facility]   and Internal Hemorrhoid Banding  . CATARACT EXTRACTION W/ INTRAOCULAR LENS  IMPLANT, BILATERAL  2013 approx.  . COLONOSCOPY WITH PROPOFOL N/A 07/27/2013   Procedure: COLONOSCOPY WITH PROPOFOL;  Surgeon: Garlan Fair, MD;  [facility];  Service: Endoscopy;  Laterality: N/A;  . HEMORRHOID SURGERY N/A 10/02/2017   Procedure: HEMORRHOIDECTOMY, Anterior and Posterior;  Surgeon: Leighton Ruff, MD;  [facility];  Service: General;  Laterality: N/A;  . INCISION AND DRAINAGE PERIRECTAL ABSCESS  03-22-2003     [facility]  . INGUINAL HERNIA REPAIR Right 08-14-2004    dr Ninfa Linden [facility]  . LUMBAR  DISECTOMY  03/ 2015        [facility])  . NASAL SINUS SURGERY  07/29/2001  . THUMB ARTHROTOMY/  REMOVAL MULTIPLE LOOSE BODIES/ SYNVECTOMY/  RESECTION MUCOID CYST Left 01-16-2010    dr sypher  [facility]  . TOE SURGERY Bilateral   . TRANSURETHRAL RESECTION OF PROSTATE  1983  . UVULOPALATOPHARYNGOPLASTY  1993    There were no vitals filed for this visit.  Subjective Assessment - 03/19/18 1025    Subjective  "ok, it is a lot better, a lot better"    Currently in Pain?  No/denies    Pain Score  0-No pain                No data recorded       [facility] Adult PT Treatment/Exercise - 03/19/18 0001      Lumbar Exercises: Aerobic   Nustep  L2  x 68min       Moist Heat Therapy   Number Minutes Moist Heat  15 Minutes    Moist Heat Location  Cervical;Lumbar Spine      Electrical Stimulation   Electrical Stimulation Location  R neck upper trap    Electrical Stimulation Action  IFC    Electrical Stimulation Parameters  supine    Electrical Stimulation Goals  Pain  Manual Therapy   Manual Therapy  Passive ROM    Manual therapy comments  supine and sitting positions for UT trigger point release.     Myofascial Release  TPR to B UT, LS,     Passive ROM  PROM all directions with gentle LS, UT stretch + manual traction x 30 sec each way     Manual Traction  Manual cerv traction 2 x 30 sec                PT Short Term Goals - 01/28/18 4315      PT SHORT TERM GOAL #1   Title  He will be independent with initial HEP    Status  Achieved        PT Long Term Goals - 03/12/18 1409      PT LONG TERM GOAL #1   Title  He will be independent with advanced HEP and safe with a gym    Status  On-going      PT LONG TERM GOAL #2   Title  He will report pain decreased 75% or more with   rotation to look for traffic    Status  On-going      PT LONG TERM GOAL #3   Title  He will improve  to 50 degrees cervical rotation to allow normal looking to side and behind     Status   On-going      PT LONG TERM GOAL #4   Title  He will be able to get out of bed without pain    Status  On-going      PT LONG TERM GOAL #5   Title  increase lumbar ROM 25%    Period  Weeks    Status  New            Plan - 03/19/18 1102    Clinical Impression Statement  Some tightness noted in upper R trap. Tightness noted in posterior cervical paraspinals. Cervical ROM has improved but is still limited.    PT Frequency  2x / week    PT Duration  8 weeks    PT Treatment/Interventions  Manual techniques;Dry needling;Moist Heat;Ultrasound;Traction;Passive range of motion;Patient/family education;Electrical Stimulation;Cryotherapy;ADLs/Self Care Home Management;Neuromuscular re-education;Therapeutic exercise;Therapeutic activities    PT Next Visit Plan  cervical rotation stretch with towel       Patient will benefit from skilled therapeutic intervention in order to improve the following deficits and impairments:  Decreased range of motion, Decreased activity tolerance, Postural dysfunction, Increased muscle spasms, Pain, Difficulty walking, Impaired flexibility, Decreased mobility, Decreased strength  Visit Diagnosis: Cervicalgia  Stiffness of cervical spine  Abnormal posture  Cramp and spasm     Problem List Patient Active Problem List   Diagnosis Date Noted  . HEARTBURN 05/09/2008  . ABDOMINAL BLOATING 05/09/2008  . HYPERTENSION 05/05/2008  . CONSTIPATION 05/05/2008  . ARTHRITIS 05/05/2008  . SLEEP APNEA 05/05/2008  . HERNIORRHAPHY, HX OF 05/05/2008    Marvin West, Marvin West 03/19/2018, 11:06 AM  [address] [place], [place], [address] Phone: [phone]   Fax:  [phone]  Name: Marvin West MRN: [id number] Date of Birth: May 19, 1934

## 2018-03-25 ENCOUNTER — Ambulatory Visit: Payer: Medicare Other | Attending: Internal Medicine | Admitting: Physical Therapy

## 2018-03-25 DIAGNOSIS — M542 Cervicalgia: Secondary | ICD-10-CM | POA: Insufficient documentation

## 2018-03-25 DIAGNOSIS — G8929 Other chronic pain: Secondary | ICD-10-CM | POA: Diagnosis not present

## 2018-03-25 DIAGNOSIS — M5442 Lumbago with sciatica, left side: Secondary | ICD-10-CM | POA: Insufficient documentation

## 2018-03-25 DIAGNOSIS — M436 Torticollis: Secondary | ICD-10-CM | POA: Diagnosis not present

## 2018-03-25 NOTE — Therapy (Signed)
[place], [place], [address] Phone: [phone]   Fax:  [phone]  Physical Therapy Treatment  Patient Details  Name: Marvin West MRN: [id number] Date of Birth: June 12, 1934 Referring Provider: Lavone Orn   Encounter Date: 03/25/2018  PT End of Session - 03/25/18 1103    Visit Number  14    Number of Visits  16    Date for PT Re-Evaluation  03/27/18    Authorization Type  MCR    PT Start Time  1103    PT Stop Time  1210    PT Time Calculation (min)  67 min    Activity Tolerance  Patient tolerated treatment well    Behavior During Therapy  [facility] for tasks assessed/performed       Past Medical History:  Diagnosis Date  . Arthritis   . Benign localized prostatic hyperplasia with lower urinary tract symptoms (LUTS)   . Chronic back pain   . CKD (chronic kidney disease), stage III ([place])   . Diverticulosis   . DJD (degenerative joint disease)   . ED (erectile dysfunction)   . GERD (gastroesophageal reflux disease)   . Gout    per last attack over a year 2016 approx.  . Hemorrhoids   . Hyperlipidemia   . Hypertension   . Hypogonadism in male   . OSA on CPAP   . Rectal bleeding   . Wears glasses     Past Surgical History:  Procedure Laterality Date  . ANAL FISTULOTOMY  07-04-2003     [facility]   and Internal Hemorrhoid Banding  . CATARACT EXTRACTION W/ INTRAOCULAR LENS  IMPLANT, BILATERAL  2013 approx.  . COLONOSCOPY WITH PROPOFOL N/A 07/27/2013   Procedure: COLONOSCOPY WITH PROPOFOL;  Surgeon: Garlan Fair, MD;  [facility];  Service: Endoscopy;  Laterality: N/A;  . HEMORRHOID SURGERY N/A 10/02/2017   Procedure: HEMORRHOIDECTOMY, Anterior and Posterior;  Surgeon: Leighton Ruff, MD;  Location: [facility];  Service: General;  Laterality: N/A;  . INCISION AND DRAINAGE PERIRECTAL ABSCESS  03-22-2003     [facility]  . INGUINAL HERNIA REPAIR Right 08-14-2004    dr  Ninfa Linden [facility]  . LUMBAR DISECTOMY  03/ 2015        [facility])  . NASAL SINUS SURGERY  07/29/2001  . THUMB ARTHROTOMY/  REMOVAL MULTIPLE LOOSE BODIES/ SYNVECTOMY/  RESECTION MUCOID CYST Left 01-16-2010    dr sypher  [facility]  . TOE SURGERY Bilateral   . TRANSURETHRAL RESECTION OF PROSTATE  1983  . UVULOPALATOPHARYNGOPLASTY  1993    There were no vitals filed for this visit.  Subjective Assessment - 03/25/18 1103    Subjective  Patient still reporting pain mainly on the R side, but still some L sided pain. He also reports low back pain with left side radicular pain.    Patient Stated Goals  less pain and stiffness    Pain Score  4     Pain Location  Neck    Pain Orientation  Right    Pain Descriptors / Indicators  Tightness                       [facility] Adult PT Treatment/Exercise - 03/25/18 0001      Modalities   Modalities  Electrical Stimulation;Moist Heat      Moist Heat Therapy   Number Minutes Moist Heat  15 Minutes    Moist Heat Location  Cervical;Lumbar Spine      Electrical Stimulation   Electrical Stimulation Location  cervical  and left gluteals    Electrical Stimulation Action  premod 80-150 Hz x 15 min    Electrical Stimulation Goals  Pain      Manual Therapy   Manual Therapy  Soft tissue mobilization    Soft tissue mobilization  to bil cervical paraspinals, UT, levator scap, subocciptitals and L gluteals       Trigger Point Dry Needling - 03/25/18 1204    Consent Given?  Yes    Education Handout Provided  Yes    Muscles Treated Upper Body  Upper trapezius;Levator scapulae;Suboccipitals muscle group C2-4 mulitifidi bil    Muscles Treated Lower Body  Gluteus minimus;Gluteus maximus left and glut med    Upper Trapezius Response  Twitch reponse elicited;Palpable increased muscle length    SubOccipitals Response  Twitch response elicited;Palpable increased muscle length    Levator Scapulae Response  Twitch response elicited;Palpable increased muscle  length    Longissimus Response  Twitch response elicited;Palpable increased muscle length multifidi    Gluteus Maximus Response  Twitch response elicited;Palpable increased muscle length    Gluteus Minimus Response  Twitch response elicited;Palpable increased muscle length           PT Education - 03/25/18 1158    Education provided  Yes    Education Details  Reviewed DN education and aftercare    Person(s) Educated  Patient    Methods  Explanation;Handout    Comprehension  Verbalized understanding       PT Short Term Goals - 01/28/18 0853      PT SHORT TERM GOAL #1   Title  He will be independent with initial HEP    Status  Achieved        PT Long Term Goals - 03/12/18 1409      PT LONG TERM GOAL #1   Title  He will be independent with advanced HEP and safe with a gym    Status  On-going      PT LONG TERM GOAL #2   Title  He will report pain decreased 75% or more with   rotation to look for traffic    Status  On-going      PT LONG TERM GOAL #3   Title  He will improve  to 50 degrees cervical rotation to allow normal looking to side and behind     Status  On-going      PT LONG TERM GOAL #4   Title  He will be able to get out of bed without pain    Status  On-going      PT LONG TERM GOAL #5   Title  increase lumbar ROM 25%    Period  Weeks    Status  New            Plan - 03/25/18 1200    Clinical Impression Statement  Patient did very well with DN today in both cervical and L gluteals. He had ++ twitch responses in glut min/med and max, Bil UT and R subocciptals.    PT Treatment/Interventions  Manual techniques;Dry needling;Moist Heat;Ultrasound;Traction;Passive range of motion;Patient/family education;Electrical Stimulation;Cryotherapy;ADLs/Self Care Home Management;Neuromuscular re-education;Therapeutic exercise;Therapeutic activities    PT Next Visit Plan  Assess DN and continue as indicated; cervical rotation stretch with towel       Patient will  benefit from skilled therapeutic intervention in order to improve the following deficits and impairments:  Decreased  range of motion, Decreased activity tolerance, Postural dysfunction, Increased muscle spasms, Pain, Difficulty walking, Impaired flexibility, Decreased mobility, Decreased strength  Visit Diagnosis: Cervicalgia  Stiffness of cervical spine  Chronic bilateral low back pain with left-sided sciatica     Problem List Patient Active Problem List   Diagnosis Date Noted  . HEARTBURN 05/09/2008  . ABDOMINAL BLOATING 05/09/2008  . HYPERTENSION 05/05/2008  . CONSTIPATION 05/05/2008  . ARTHRITIS 05/05/2008  . SLEEP APNEA 05/05/2008  . HERNIORRHAPHY, HX OF 05/05/2008    Darriel Utter PT 03/25/2018, 12:08 PM  [place] [address] [place], [place], [address] Phone: [phone]   Fax:  [phone]  Name: DAMARRI RAMPY MRN: [id number] Date of Birth: 26-Nov-1934

## 2018-03-25 NOTE — Patient Instructions (Addendum)
Trigger Point Dry Needling  . What is Trigger Point Dry Needling (DN)? o DN is a physical therapy technique used to treat muscle pain and dysfunction. Specifically, DN helps deactivate muscle trigger points (muscle knots).  o A thin filiform needle is used to penetrate the skin and stimulate the underlying trigger point. The goal is for a local twitch response (LTR) to occur and for the trigger point to relax. No medication of any kind is injected during the procedure.   . What Does Trigger Point Dry Needling Feel Like?  o The procedure feels different for each individual patient. Some patients report that they do not actually feel the needle enter the skin and overall the process is not painful. Very mild bleeding may occur. However, many patients feel a deep cramping in the muscle in which the needle was inserted. This is the local twitch response.   Marland Kitchen How Will I feel after the treatment? o Soreness is normal, and the onset of soreness may not occur for a few hours. Typically this soreness does not last longer than two days.  o Bruising is uncommon, however; ice can be used to decrease any possible bruising.  o In rare cases feeling tired or nauseous after the treatment is normal. In addition, your symptoms may get worse before they get better, this period will typically not last longer than 24 hours.   . What Can I do After My Treatment? o Increase your hydration by drinking more water for the next 24 hours. o You may place ice or heat on the areas treated that have become sore, however, do not use heat on inflamed or bruised areas. Heat often brings more relief post needling. o You can continue your regular activities, but vigorous activity is not recommended initially after the treatment for 24 hours. o DN is best combined with other physical therapy such as strengthening, stretching, and other therapies.    Precautions:  In some cases, dry needling is done over the lung field. While rare,  there is a risk of pneumothorax (punctured lung). Because of this, if you ever experience shortness of breath on exertion, difficulty taking a deep breath, chest pain or a dry cough following dry needling, you should report to an emergency room and tell them that you have been dry needled over the thorax.  Madelyn Flavors, PT 03/25/18 11:58 AM [facility] [address] [place], [place], [address] Phone: [phone]   Fax:  [phone]

## 2018-04-06 DIAGNOSIS — E291 Testicular hypofunction: Secondary | ICD-10-CM | POA: Diagnosis not present

## 2018-04-27 DIAGNOSIS — E291 Testicular hypofunction: Secondary | ICD-10-CM | POA: Diagnosis not present

## 2018-04-30 ENCOUNTER — Ambulatory Visit: Payer: Medicare Other | Attending: Internal Medicine | Admitting: Physical Therapy

## 2018-04-30 DIAGNOSIS — R293 Abnormal posture: Secondary | ICD-10-CM | POA: Diagnosis not present

## 2018-04-30 DIAGNOSIS — M542 Cervicalgia: Secondary | ICD-10-CM | POA: Insufficient documentation

## 2018-04-30 DIAGNOSIS — G8929 Other chronic pain: Secondary | ICD-10-CM | POA: Diagnosis not present

## 2018-04-30 DIAGNOSIS — M436 Torticollis: Secondary | ICD-10-CM | POA: Diagnosis not present

## 2018-04-30 DIAGNOSIS — M5442 Lumbago with sciatica, left side: Secondary | ICD-10-CM | POA: Diagnosis not present

## 2018-04-30 NOTE — Therapy (Signed)
[place], [place], [address] Phone: [phone]   Fax:  [phone]  Physical Therapy Treatment  Patient Details  Name: Marvin West MRN: [id number] Date of Birth: 03-23-1934 Referring Provider: Lavone Orn   Encounter Date: 04/30/2018  PT End of Session - 04/30/18 0800    Visit Number  15    Number of Visits  16    Date for PT Re-Evaluation  03/27/18    Authorization Type  MCR KX modifier    PT Start Time  0801    PT Stop Time  0903    PT Time Calculation (min)  62 min    Activity Tolerance  Patient tolerated treatment well    Behavior During Therapy  [facility] for tasks assessed/performed       Past Medical History:  Diagnosis Date  . Arthritis   . Benign localized prostatic hyperplasia with lower urinary tract symptoms (LUTS)   . Chronic back pain   . CKD (chronic kidney disease), stage III ([place])   . Diverticulosis   . DJD (degenerative joint disease)   . ED (erectile dysfunction)   . GERD (gastroesophageal reflux disease)   . Gout    per last attack over a year 2016 approx.  . Hemorrhoids   . Hyperlipidemia   . Hypertension   . Hypogonadism in male   . OSA on CPAP   . Rectal bleeding   . Wears glasses     Past Surgical History:  Procedure Laterality Date  . ANAL FISTULOTOMY  07-04-2003     [facility]   and Internal Hemorrhoid Banding  . CATARACT EXTRACTION W/ INTRAOCULAR LENS  IMPLANT, BILATERAL  2013 approx.  . COLONOSCOPY WITH PROPOFOL N/A 07/27/2013   Procedure: COLONOSCOPY WITH PROPOFOL;  Surgeon: Garlan Fair, MD;  Location: [facility];  Service: Endoscopy;  Laterality: N/A;  . HEMORRHOID SURGERY N/A 10/02/2017   Procedure: HEMORRHOIDECTOMY, Anterior and Posterior;  Surgeon: Leighton Ruff, MD;  Location: [facility];  Service: General;  Laterality: N/A;  . INCISION AND DRAINAGE PERIRECTAL ABSCESS  03-22-2003     [facility]  . INGUINAL HERNIA REPAIR Right  08-14-2004    dr Ninfa Linden [facility]  . LUMBAR DISECTOMY  03/ 2015        [facility])  . NASAL SINUS SURGERY  07/29/2001  . THUMB ARTHROTOMY/  REMOVAL MULTIPLE LOOSE BODIES/ SYNVECTOMY/  RESECTION MUCOID CYST Left 01-16-2010    dr sypher  [facility]  . TOE SURGERY Bilateral   . TRANSURETHRAL RESECTION OF PROSTATE  1983  . UVULOPALATOPHARYNGOPLASTY  1993    There were no vitals filed for this visit.  Subjective Assessment - 04/30/18 0802    Subjective  Patient reports soreness in R lateral neck and across subocciptals. "I feel it's getting better.    Currently in Pain?  Yes    Pain Score  4     Pain Location  Neck    Pain Orientation  Right    Pain Descriptors / Indicators  Sore                       [facility] Adult PT Treatment/Exercise - 04/30/18 0001      Modalities   Modalities  Electrical Stimulation      Moist Heat Therapy   Number Minutes Moist Heat  15 Minutes    Moist Heat Location  Cervical      Electrical Stimulation   Electrical Stimulation  Location  cervical spine    Electrical Stimulation Action  premod 80-150 Hz x 15 min    Electrical Stimulation Goals  Pain      Manual Therapy   Manual Therapy  Soft tissue mobilization    Soft tissue mobilization  to bil cervical paraspinals, UT, levator scap, subocciptitals      Neck Exercises: Stretches   Upper Trapezius Stretch  Right;Left;1 rep;20 seconds    Levator Stretch  Left;Right;1 rep;20 seconds       Trigger Point Dry Needling - 04/30/18 0857    Consent Given?  Yes    Muscles Treated Upper Body  Upper trapezius;Suboccipitals muscle group;Longissimus Bil; multifidi C3 and C4 and L4 and L5    Upper Trapezius Response  Twitch reponse elicited;Palpable increased muscle length    SubOccipitals Response  Twitch response elicited;Palpable increased muscle length    Longissimus Response  Twitch response elicited;Palpable increased muscle length             PT Short Term Goals - 01/28/18 3419      PT SHORT  TERM GOAL #1   Title  He will be independent with initial HEP    Status  Achieved        PT Long Term Goals - 03/12/18 1409      PT LONG TERM GOAL #1   Title  He will be independent with advanced HEP and safe with a gym    Status  On-going      PT LONG TERM GOAL #2   Title  He will report pain decreased 75% or more with   rotation to look for traffic    Status  On-going      PT LONG TERM GOAL #3   Title  He will improve  to 50 degrees cervical rotation to allow normal looking to side and behind     Status  On-going      PT LONG TERM GOAL #4   Title  He will be able to get out of bed without pain    Status  On-going      PT LONG TERM GOAL #5   Title  increase lumbar ROM 25%    Period  Weeks    Status  New            Plan - 04/30/18 1159    Clinical Impression Statement  Paitent tolerated DN well today with ++ twitch response L> R in UT.    PT Treatment/Interventions  Manual techniques;Dry needling;Moist Heat;Ultrasound;Traction;Passive range of motion;Patient/family education;Electrical Stimulation;Cryotherapy;ADLs/Self Care Home Management;Neuromuscular re-education;Therapeutic exercise;Therapeutic activities    PT Next Visit Plan  Assess goals; renew or d/c with cervical stretches    Consulted and Agree with Plan of Care  Patient       Patient will benefit from skilled therapeutic intervention in order to improve the following deficits and impairments:  Decreased range of motion, Decreased activity tolerance, Postural dysfunction, Increased muscle spasms, Pain, Difficulty walking, Impaired flexibility, Decreased mobility, Decreased strength  Visit Diagnosis: Cervicalgia  Stiffness of cervical spine  Chronic bilateral low back pain with left-sided sciatica     Problem List Patient Active Problem List   Diagnosis Date Noted  . HEARTBURN 05/09/2008  . ABDOMINAL BLOATING 05/09/2008  . HYPERTENSION 05/05/2008  . CONSTIPATION 05/05/2008  . ARTHRITIS  05/05/2008  . SLEEP APNEA 05/05/2008  . HERNIORRHAPHY, HX OF 05/05/2008    Jaedin Regina PT 04/30/2018, 12:02 PM  [facility] [phone] [address].  [facility] [place], [place], [address] Phone: [phone]   Fax:  [phone]  Name: OSEIAS HORSEY MRN: [id number] Date of Birth: 1934/12/19

## 2018-05-04 DIAGNOSIS — E291 Testicular hypofunction: Secondary | ICD-10-CM | POA: Diagnosis not present

## 2018-05-07 ENCOUNTER — Ambulatory Visit: Payer: Medicare Other | Admitting: Physical Therapy

## 2018-05-07 DIAGNOSIS — M542 Cervicalgia: Secondary | ICD-10-CM

## 2018-05-07 DIAGNOSIS — G8929 Other chronic pain: Secondary | ICD-10-CM | POA: Diagnosis not present

## 2018-05-07 DIAGNOSIS — M436 Torticollis: Secondary | ICD-10-CM | POA: Diagnosis not present

## 2018-05-07 DIAGNOSIS — R293 Abnormal posture: Secondary | ICD-10-CM | POA: Diagnosis not present

## 2018-05-07 DIAGNOSIS — M5442 Lumbago with sciatica, left side: Secondary | ICD-10-CM

## 2018-05-07 NOTE — Therapy (Signed)
[place], [place], [address] Phone: [phone]   Fax:  [phone]  Physical Therapy Treatment  Patient Details  Name: Marvin West MRN: [id number] Date of Birth: [birth year] Referring Provider: Lavone Orn   Encounter Date: 05/07/2018  PT End of Session - 05/07/18 1310    Visit Number  16    Number of Visits  24    Date for PT Re-Evaluation  06/18/18    Authorization Type  MCR KX modifier    PT Start Time  1306    PT Stop Time  1410    PT Time Calculation (min)  64 min    Activity Tolerance  Patient tolerated treatment well    Behavior During Therapy  [facility] for tasks assessed/performed       Past Medical History:  Diagnosis Date  . Arthritis   . Benign localized prostatic hyperplasia with lower urinary tract symptoms (LUTS)   . Chronic back pain   . CKD (chronic kidney disease), stage III ([place])   . Diverticulosis   . DJD (degenerative joint disease)   . ED (erectile dysfunction)   . GERD (gastroesophageal reflux disease)   . Gout    per last attack over a year 2016 approx.  . Hemorrhoids   . Hyperlipidemia   . Hypertension   . Hypogonadism in male   . OSA on CPAP   . Rectal bleeding   . Wears glasses     Past Surgical History:  Procedure Laterality Date  . ANAL FISTULOTOMY  07-04-2003     [facility]   and Internal Hemorrhoid Banding  . CATARACT EXTRACTION W/ INTRAOCULAR LENS  IMPLANT, BILATERAL  2013 approx.  . COLONOSCOPY WITH PROPOFOL N/A 07/27/2013   Procedure: COLONOSCOPY WITH PROPOFOL;  Surgeon: Garlan Fair, MD;  Location: [facility];  Service: Endoscopy;  Laterality: N/A;  . HEMORRHOID SURGERY N/A 10/02/2017   Procedure: HEMORRHOIDECTOMY, Anterior and Posterior;  Surgeon: Leighton Ruff, MD;  Location: [facility];  Service: General;  Laterality: N/A;  . INCISION AND DRAINAGE PERIRECTAL ABSCESS  03-22-2003     [facility]  . INGUINAL HERNIA REPAIR Right  08-14-2004    dr Ninfa Linden [facility] [place]  . LUMBAR DISECTOMY  03/ 2015        [facility])  . NASAL SINUS SURGERY  07/29/2001  . THUMB ARTHROTOMY/  REMOVAL MULTIPLE LOOSE BODIES/ SYNVECTOMY/  RESECTION MUCOID CYST Left 01-16-2010    dr sypher  [facility]  . TOE SURGERY Bilateral   . TRANSURETHRAL RESECTION OF PROSTATE  1983  . UVULOPALATOPHARYNGOPLASTY  1993    There were no vitals filed for this visit.  Subjective Assessment - 05/07/18 1551    Subjective  Patient states he feel he has made significant improvements but his neck ROM is still limited.    Patient Stated Goals  less pain and stiffness    Currently in Pain?  Yes    Pain Score  3     Pain Location  Neck    Pain Orientation  Right    Pain Descriptors / Indicators  -- sore    Pain Type  Chronic pain    Pain Onset  More than a month ago    Aggravating Factors   turning    Effect of Pain on Daily Activities  turns body when turning head         [facility] PT Assessment - 05/07/18 0001      AROM  Overall AROM Comments  lumbar ROM WFL; flex hands to knees    Cervical Flexion  15 33 after treatment today    Cervical Extension  15    Cervical - Right Rotation  38    Cervical - Left Rotation  34                   [facility] Adult PT Treatment/Exercise - 05/07/18 0001      Neck Exercises: Machines for Strengthening   UBE (Upper Arm Bike)  L3 x 6 min 3 fwd/3 bwd      Neck Exercises: Supine   Cervical Rotation  Both;5 reps      Lumbar Exercises: Stretches   Lower Trunk Rotation  Other (comment) 10 reps Bil    Other Lumbar Stretch Exercise  upper trunk rotation x 10 bil (straight arms leading)      Lumbar Exercises: Seated   Other Seated Lumbar Exercises  seated reach floor to OH x 10, diagonals x 10 ea    Other Seated Lumbar Exercises  seated rotation x 10 bil      Modalities   Modalities  Electrical Stimulation;Moist Heat      Moist Heat Therapy   Number Minutes Moist Heat  15 Minutes    Moist Heat Location  Cervical       Electrical Stimulation   Electrical Stimulation Location  cervical/lumbar    Electrical Stimulation Action  premod    Electrical Stimulation Parameters  80-150 Hz x 15 min    Electrical Stimulation Goals  Pain               PT Short Term Goals - 01/28/18 1610      PT SHORT TERM GOAL #1   Title  He will be independent with initial HEP    Status  Achieved        PT Long Term Goals - 05/07/18 1311      PT LONG TERM GOAL #1   Title  He will be independent with advanced HEP and safe with a gym    Time  8    Status  On-going      PT LONG TERM GOAL #2   Title  He will report pain decreased 75% or more with   rotation to look for traffic    Baseline  80% better per patient     Time  8    Period  Weeks    Status  Achieved      PT LONG TERM GOAL #3   Title  He will improve  to 50 degrees cervical rotation to allow normal looking to side and behind     Time  8    Period  Weeks    Status  On-going      PT LONG TERM GOAL #4   Title  He will be able to get out of bed without pain    Time  8    Period  Weeks    Status  Achieved      PT LONG TERM GOAL #5   Title  increase lumbar ROM 25%    Time  8    Period  Weeks    Status  Achieved            Plan - 05/07/18 1558    Clinical Impression Statement  Patient did very well today with full body ROM exercises encouraging his head to follow hands. He has met 3/5 LTGs but continues to  be significantly limited with cervical ROM and complains of intermittent LBP. Patient will benefit from further therapy to meet remaining goals.    Clinical Presentation  Stable    Clinical Decision Making  Low    Rehab Potential  Good    PT Frequency  2x / week    PT Duration  4 weeks    PT Treatment/Interventions  Manual techniques;Dry needling;Moist Heat;Ultrasound;Traction;Passive range of motion;Patient/family education;Electrical Stimulation;Cryotherapy;ADLs/Self Care Home Management;Neuromuscular re-education;Therapeutic  exercise;Therapeutic activities    PT Next Visit Plan  Pt on vacation for one week, then Continue with full spinal ROM exercises and other TE to encourage spinal mobility.    Consulted and Agree with Plan of Care  Patient       Patient will benefit from skilled therapeutic intervention in order to improve the following deficits and impairments:  Decreased range of motion, Decreased activity tolerance, Postural dysfunction, Increased muscle spasms, Pain, Difficulty walking, Impaired flexibility, Decreased mobility, Decreased strength  Visit Diagnosis: Cervicalgia - Plan: PT plan of care cert/re-cert  Stiffness of cervical spine - Plan: PT plan of care cert/re-cert  Chronic bilateral low back pain with left-sided sciatica - Plan: PT plan of care cert/re-cert  Abnormal posture - Plan: PT plan of care cert/re-cert     Problem List Patient Active Problem List   Diagnosis Date Noted  . HEARTBURN 05/09/2008  . ABDOMINAL BLOATING 05/09/2008  . HYPERTENSION 05/05/2008  . CONSTIPATION 05/05/2008  . ARTHRITIS 05/05/2008  . SLEEP APNEA 05/05/2008  . HERNIORRHAPHY, HX OF 05/05/2008    Babatunde Seago PT 05/07/2018, 4:10 PM [place] [address] [place], [place], [address] Phone: [phone]   Fax:  [phone]  Name: VICKY MCCANLESS MRN: [id number] Date of Birth: Sep 22, 1934

## 2018-05-19 ENCOUNTER — Ambulatory Visit: Payer: Medicare Other | Admitting: Physical Therapy

## 2018-05-19 DIAGNOSIS — M5442 Lumbago with sciatica, left side: Secondary | ICD-10-CM

## 2018-05-19 DIAGNOSIS — M436 Torticollis: Secondary | ICD-10-CM

## 2018-05-19 DIAGNOSIS — G8929 Other chronic pain: Secondary | ICD-10-CM | POA: Diagnosis not present

## 2018-05-19 DIAGNOSIS — R293 Abnormal posture: Secondary | ICD-10-CM | POA: Diagnosis not present

## 2018-05-19 DIAGNOSIS — M542 Cervicalgia: Secondary | ICD-10-CM | POA: Diagnosis not present

## 2018-05-19 NOTE — Therapy (Signed)
[place], [place], [address] Phone: [phone]   Fax:  [phone]  Physical Therapy Treatment  Patient Details  Name: Marvin West MRN: [id number] Date of Birth: Aug 10, 1934 Referring Provider: Lavone Orn   Encounter Date: 05/19/2018  PT End of Session - 05/19/18 0859    Visit Number  17    Number of Visits  24    Date for PT Re-Evaluation  06/18/18    Authorization Type  MCR KX modifier    PT Start Time  0849    PT Stop Time  0940    PT Time Calculation (min)  51 min    Activity Tolerance  Patient tolerated treatment well    Behavior During Therapy  [facility] for tasks assessed/performed       Past Medical History:  Diagnosis Date  . Arthritis   . Benign localized prostatic hyperplasia with lower urinary tract symptoms (LUTS)   . Chronic back pain   . CKD (chronic kidney disease), stage III ([place])   . Diverticulosis   . DJD (degenerative joint disease)   . ED (erectile dysfunction)   . GERD (gastroesophageal reflux disease)   . Gout    per last attack over a year 2016 approx.  . Hemorrhoids   . Hyperlipidemia   . Hypertension   . Hypogonadism in male   . OSA on CPAP   . Rectal bleeding   . Wears glasses     Past Surgical History:  Procedure Laterality Date  . ANAL FISTULOTOMY  07-04-2003     [facility]   and Internal Hemorrhoid Banding  . CATARACT EXTRACTION W/ INTRAOCULAR LENS  IMPLANT, BILATERAL  2013 approx.  . COLONOSCOPY WITH PROPOFOL N/A 07/27/2013   Procedure: COLONOSCOPY WITH PROPOFOL;  Surgeon: Garlan Fair, MD;  [facility]: [facility];  Service: Endoscopy;  Laterality: N/A;  . HEMORRHOID SURGERY N/A 10/02/2017   Procedure: HEMORRHOIDECTOMY, Anterior and Posterior;  Surgeon: Leighton Ruff, MD;  Location: [facility];  Service: General;  Laterality: N/A;  . INCISION AND DRAINAGE PERIRECTAL ABSCESS  03-22-2003     [facility]  . INGUINAL HERNIA REPAIR Right  08-14-2004    dr Ninfa Linden [facility]  . LUMBAR DISECTOMY  03/ 2015        [facility])  . NASAL SINUS SURGERY  07/29/2001  . THUMB ARTHROTOMY/  REMOVAL MULTIPLE LOOSE BODIES/ SYNVECTOMY/  RESECTION MUCOID CYST Left 01-16-2010    dr sypher  [facility]  . TOE SURGERY Bilateral   . TRANSURETHRAL RESECTION OF PROSTATE  1983  . UVULOPALATOPHARYNGOPLASTY  1993    There were no vitals filed for this visit.  Subjective Assessment - 05/19/18 0852    Subjective  Patient reports just stiffness in his neck. He reports his back hurts when he bends a lot.    Patient Stated Goals  less pain and stiffness    Currently in Pain?  Yes    Pain Score  3     Pain Location  Neck    Pain Orientation  Right;Left R>L    Pain Descriptors / Indicators  Aching                       [facility] Adult PT Treatment/Exercise - 05/19/18 0001      Neck Exercises: Machines for Strengthening   UBE (Upper Arm Bike)  L3 x 7 min 3.5 fwd/bwd      Lumbar Exercises: Seated   Other  Seated Lumbar Exercises  seated reach floor to OH x 10, diagonals x 10 ea    Other Seated Lumbar Exercises  seated rotation x 10 bil      Modalities   Modalities  Electrical Stimulation;Moist Heat;Traction      Moist Heat Therapy   Number Minutes Moist Heat  15 Minutes    Moist Heat Location  Cervical      Electrical Stimulation   Electrical Stimulation Location  cervical/lumbar    Electrical Stimulation Action  premod    Electrical Stimulation Parameters  80-150 Hz x 15 min    Electrical Stimulation Goals  Pain      Traction   Type of Traction  Cervical    Min (lbs)  0    Max (lbs)  15    Hold Time  static    Time  15      Manual Therapy   Manual Traction  seated distraction; distraction with rotation 2x5 to right 1x5 to left               PT Short Term Goals - 01/28/18 4401      PT SHORT TERM GOAL #1   Title  He will be independent with initial HEP    Status  Achieved        PT Long Term Goals - 05/07/18 1311       PT LONG TERM GOAL #1   Title  He will be independent with advanced HEP and safe with a gym    Time  8    Status  On-going      PT LONG TERM GOAL #2   Title  He will report pain decreased 75% or more with   rotation to look for traffic    Baseline  80% better per patient     Time  8    Period  Weeks    Status  Achieved      PT LONG TERM GOAL #3   Title  He will improve  to 50 degrees cervical rotation to allow normal looking to side and behind     Time  8    Period  Weeks    Status  On-going      PT LONG TERM GOAL #4   Title  He will be able to get out of bed without pain    Time  8    Period  Weeks    Status  Achieved      PT LONG TERM GOAL #5   Title  increase lumbar ROM 25%    Time  8    Period  Weeks    Status  Achieved            Plan - 05/19/18 1216    Clinical Impression Statement  Patient did well with full body exercises today but requires continual cueing to follow hands with his eyes. ROM improves when he does this. He is still significantly limited with rotation. Goals are ongoing. Tolerated static traction well. No significant change immediately folloowing.    Rehab Potential  Good    PT Frequency  2x / week    PT Duration  4 weeks    PT Treatment/Interventions  Manual techniques;Dry needling;Moist Heat;Ultrasound;Traction;Passive range of motion;Patient/family education;Electrical Stimulation;Cryotherapy;ADLs/Self Care Home Management;Neuromuscular re-education;Therapeutic exercise;Therapeutic activities    PT Next Visit Plan  Assess traction; Continue with full spinal ROM exercises and other TE to encourage spinal mobility.    PT Home Exercise Plan  postural exercises , ROM, rotation and extension snags with towel , isometrics neck     Consulted and Agree with Plan of Care  Patient       Patient will benefit from skilled therapeutic intervention in order to improve the following deficits and impairments:  Decreased range of motion, Decreased activity  tolerance, Postural dysfunction, Increased muscle spasms, Pain, Difficulty walking, Impaired flexibility, Decreased mobility, Decreased strength  Visit Diagnosis: Cervicalgia  Chronic bilateral low back pain with left-sided sciatica  Stiffness of cervical spine     Problem List Patient Active Problem List   Diagnosis Date Noted  . HEARTBURN 05/09/2008  . ABDOMINAL BLOATING 05/09/2008  . HYPERTENSION 05/05/2008  . CONSTIPATION 05/05/2008  . ARTHRITIS 05/05/2008  . SLEEP APNEA 05/05/2008  . HERNIORRHAPHY, HX OF 05/05/2008    Madelyn Flavors PT 05/19/2018, 12:21 PM  [place] [address] [place], [place], [address] Phone: [phone]   Fax:  [phone]  Name: Marvin West MRN: [id number] Date of Birth: 1934/03/29

## 2018-05-21 ENCOUNTER — Ambulatory Visit: Payer: Medicare Other | Admitting: Physical Therapy

## 2018-05-21 DIAGNOSIS — M5442 Lumbago with sciatica, left side: Secondary | ICD-10-CM | POA: Diagnosis not present

## 2018-05-21 DIAGNOSIS — M542 Cervicalgia: Secondary | ICD-10-CM | POA: Diagnosis not present

## 2018-05-21 DIAGNOSIS — M436 Torticollis: Secondary | ICD-10-CM | POA: Diagnosis not present

## 2018-05-21 DIAGNOSIS — G8929 Other chronic pain: Secondary | ICD-10-CM | POA: Diagnosis not present

## 2018-05-21 DIAGNOSIS — R293 Abnormal posture: Secondary | ICD-10-CM | POA: Diagnosis not present

## 2018-05-21 NOTE — Therapy (Signed)
[place] [address] [place], [place], [address] Phone: [phone]   Fax:  [phone]  Physical Therapy Treatment  Patient Details  Name: Marvin West MRN: [id number] Date of Birth: 01-21-1934 Referring Provider: Lavone Orn   Encounter Date: 05/21/2018  PT End of Session - 05/21/18 0805    Visit Number  18    Number of Visits  24    Date for PT Re-Evaluation  06/18/18    Authorization Type  MCR KX modifier    PT Start Time  0800    PT Stop Time  0850    PT Time Calculation (min)  50 min    Activity Tolerance  Patient tolerated treatment well    Behavior During Therapy  [facility] for tasks assessed/performed       Past Medical History:  Diagnosis Date  . Arthritis   . Benign localized prostatic hyperplasia with lower urinary tract symptoms (LUTS)   . Chronic back pain   . CKD (chronic kidney disease), stage III ([place])   . Diverticulosis   . DJD (degenerative joint disease)   . ED (erectile dysfunction)   . GERD (gastroesophageal reflux disease)   . Gout    per last attack over a year 2016 approx.  . Hemorrhoids   . Hyperlipidemia   . Hypertension   . Hypogonadism in male   . OSA on CPAP   . Rectal bleeding   . Wears glasses     Past Surgical History:  Procedure Laterality Date  . ANAL FISTULOTOMY  07-04-2003     [facility]   and Internal Hemorrhoid Banding  . CATARACT EXTRACTION W/ INTRAOCULAR LENS  IMPLANT, BILATERAL  2013 approx.  . COLONOSCOPY WITH PROPOFOL N/A 07/27/2013   Procedure: COLONOSCOPY WITH PROPOFOL;  Surgeon: Garlan Fair, MD;  Location: [facility];  Service: Endoscopy;  Laterality: N/A;  . HEMORRHOID SURGERY N/A 10/02/2017   Procedure: HEMORRHOIDECTOMY, Anterior and Posterior;  Surgeon: Leighton Ruff, MD;  [facility]: [facility];  Service: General;  Laterality: N/A;  . INCISION AND DRAINAGE PERIRECTAL ABSCESS  03-22-2003     [facility]  . INGUINAL HERNIA REPAIR Right  08-14-2004    dr Ninfa Linden [facility]  . LUMBAR DISECTOMY  03/ 2015        [facility])  . NASAL SINUS SURGERY  07/29/2001  . THUMB ARTHROTOMY/  REMOVAL MULTIPLE LOOSE BODIES/ SYNVECTOMY/  RESECTION MUCOID CYST Left 01-16-2010    dr sypher  [facility]  . TOE SURGERY Bilateral   . TRANSURETHRAL RESECTION OF PROSTATE  1983  . UVULOPALATOPHARYNGOPLASTY  1993    There were no vitals filed for this visit.  Subjective Assessment - 05/21/18 0805    Subjective  Patient reports the pain is better after last treatment of traction.    Patient Stated Goals  less pain and stiffness    Currently in Pain?  Yes    Pain Score  2                        [facility] Adult PT Treatment/Exercise - 05/21/18 0001      Neck Exercises: Machines for Strengthening   UBE (Upper Arm Bike)  L3 x 7 min 3.5 fwd/bwd      Neck Exercises: Standing   Neck Retraction  15 reps    Wall Wash  using red tball arms OH neck extension    Other Standing Exercises  attempted snags using towel; no signiificant  relief and hard for patient to do    Other Standing Exercises  cervical rotation x 10 ea with back against wall to isolate cspine.      Lumbar Exercises: Standing   Other Standing Lumbar Exercises  upper trunk rotation x 10 each way; diagonals x 20 bil, OH lift x 20      Modalities   Modalities  Electrical Stimulation      Moist Heat Therapy   Number Minutes Moist Heat  15 Minutes    Moist Heat Location  Lumbar Spine      Electrical Stimulation   Electrical Stimulation Location  cervical/lumbar 80-150 Hz x 15 min    Electrical Stimulation Action  premod    Electrical Stimulation Goals  Pain      Traction   Type of Traction  Cervical    Min (lbs)  0    Max (lbs)  8    Hold Time  static    Time  15               PT Short Term Goals - 01/28/18 1017      PT SHORT TERM GOAL #1   Title  He will be independent with initial HEP    Status  Achieved        PT Long Term Goals - 05/07/18 1311      PT  LONG TERM GOAL #1   Title  He will be independent with advanced HEP and safe with a gym    Time  8    Status  On-going      PT LONG TERM GOAL #2   Title  He will report pain decreased 75% or more with   rotation to look for traffic    Baseline  80% better per patient     Time  8    Period  Weeks    Status  Achieved      PT LONG TERM GOAL #3   Title  He will improve  to 50 degrees cervical rotation to allow normal looking to side and behind     Time  8    Period  Weeks    Status  On-going      PT LONG TERM GOAL #4   Title  He will be able to get out of bed without pain    Time  8    Period  Weeks    Status  Achieved      PT LONG TERM GOAL #5   Title  increase lumbar ROM 25%    Time  8    Period  Weeks    Status  Achieved            Plan - 05/21/18 0946    Clinical Impression Statement  Patient reported decreased pain since last visit. He did well with standing neck exercises today and demonstrates better quality of motion with cervical rotation although range has not improved significantly.     PT Treatment/Interventions  Manual techniques;Dry needling;Moist Heat;Ultrasound;Traction;Passive range of motion;Patient/family education;Electrical Stimulation;Cryotherapy;ADLs/Self Care Home Management;Neuromuscular re-education;Therapeutic exercise;Therapeutic activities    PT Next Visit Plan  Continue traction; Continue with full spinal ROM exercises and other TE to encourage spinal mobility.    PT Home Exercise Plan  postural exercises , ROM, rotation and extension snags with towel , isometrics neck     Consulted and Agree with Plan of Care  Patient       Patient will benefit from skilled therapeutic  intervention in order to improve the following deficits and impairments:  Decreased range of motion, Decreased activity tolerance, Postural dysfunction, Increased muscle spasms, Pain, Difficulty walking, Impaired flexibility, Decreased mobility, Decreased strength  Visit  Diagnosis: Cervicalgia  Chronic bilateral low back pain with left-sided sciatica     Problem List Patient Active Problem List   Diagnosis Date Noted  . HEARTBURN 05/09/2008  . ABDOMINAL BLOATING 05/09/2008  . HYPERTENSION 05/05/2008  . CONSTIPATION 05/05/2008  . ARTHRITIS 05/05/2008  . SLEEP APNEA 05/05/2008  . HERNIORRHAPHY, HX OF 05/05/2008    Markel Mergenthaler PT 05/21/2018, 9:50 AM  [place] [address] [place], [place], [address] Phone: [phone]   Fax:  [phone]  Name: MERCEDES VALERIANO MRN: [id number] Date of Birth: [birth year]

## 2018-05-22 DIAGNOSIS — E291 Testicular hypofunction: Secondary | ICD-10-CM | POA: Diagnosis not present

## 2018-07-03 DIAGNOSIS — R42 Dizziness and giddiness: Secondary | ICD-10-CM | POA: Diagnosis not present

## 2018-07-03 DIAGNOSIS — H6121 Impacted cerumen, right ear: Secondary | ICD-10-CM | POA: Diagnosis not present

## 2018-07-03 DIAGNOSIS — H60311 Diffuse otitis externa, right ear: Secondary | ICD-10-CM | POA: Diagnosis not present

## 2018-07-09 DIAGNOSIS — N3000 Acute cystitis without hematuria: Secondary | ICD-10-CM | POA: Diagnosis not present

## 2018-07-09 DIAGNOSIS — M545 Low back pain: Secondary | ICD-10-CM | POA: Diagnosis not present

## 2018-07-09 DIAGNOSIS — R310 Gross hematuria: Secondary | ICD-10-CM | POA: Diagnosis not present

## 2018-07-09 DIAGNOSIS — E298 Other testicular dysfunction: Secondary | ICD-10-CM | POA: Diagnosis not present

## 2018-07-09 DIAGNOSIS — E348 Other specified endocrine disorders: Secondary | ICD-10-CM | POA: Diagnosis not present

## 2018-07-27 DIAGNOSIS — I6523 Occlusion and stenosis of bilateral carotid arteries: Secondary | ICD-10-CM | POA: Diagnosis not present

## 2018-07-27 DIAGNOSIS — R002 Palpitations: Secondary | ICD-10-CM | POA: Diagnosis not present

## 2018-07-27 DIAGNOSIS — R638 Other symptoms and signs concerning food and fluid intake: Secondary | ICD-10-CM | POA: Diagnosis not present

## 2018-07-27 DIAGNOSIS — H9319 Tinnitus, unspecified ear: Secondary | ICD-10-CM | POA: Diagnosis not present

## 2018-07-27 DIAGNOSIS — M545 Low back pain: Secondary | ICD-10-CM | POA: Diagnosis not present

## 2018-07-27 DIAGNOSIS — G4730 Sleep apnea, unspecified: Secondary | ICD-10-CM | POA: Diagnosis not present

## 2018-07-27 DIAGNOSIS — E348 Other specified endocrine disorders: Secondary | ICD-10-CM | POA: Diagnosis not present

## 2018-07-27 DIAGNOSIS — E298 Other testicular dysfunction: Secondary | ICD-10-CM | POA: Diagnosis not present

## 2018-07-27 DIAGNOSIS — R1084 Generalized abdominal pain: Secondary | ICD-10-CM | POA: Diagnosis not present

## 2018-07-27 DIAGNOSIS — G4709 Other insomnia: Secondary | ICD-10-CM | POA: Diagnosis not present

## 2018-08-07 DIAGNOSIS — R002 Palpitations: Secondary | ICD-10-CM | POA: Diagnosis not present

## 2018-08-07 DIAGNOSIS — G459 Transient cerebral ischemic attack, unspecified: Secondary | ICD-10-CM | POA: Diagnosis not present

## 2018-08-07 DIAGNOSIS — R42 Dizziness and giddiness: Secondary | ICD-10-CM | POA: Diagnosis not present

## 2018-08-28 DIAGNOSIS — I6523 Occlusion and stenosis of bilateral carotid arteries: Secondary | ICD-10-CM | POA: Diagnosis not present

## 2018-08-28 DIAGNOSIS — E298 Other testicular dysfunction: Secondary | ICD-10-CM | POA: Diagnosis not present

## 2018-08-28 DIAGNOSIS — I272 Pulmonary hypertension, unspecified: Secondary | ICD-10-CM | POA: Diagnosis not present

## 2018-08-28 DIAGNOSIS — I348 Other nonrheumatic mitral valve disorders: Secondary | ICD-10-CM | POA: Diagnosis not present

## 2018-08-28 DIAGNOSIS — I10 Essential (primary) hypertension: Secondary | ICD-10-CM | POA: Diagnosis not present

## 2018-12-02 DIAGNOSIS — E291 Testicular hypofunction: Secondary | ICD-10-CM | POA: Diagnosis not present

## 2018-12-02 DIAGNOSIS — Z5181 Encounter for therapeutic drug level monitoring: Secondary | ICD-10-CM | POA: Diagnosis not present

## 2018-12-02 DIAGNOSIS — O872 Hemorrhoids in the puerperium: Secondary | ICD-10-CM | POA: Diagnosis not present

## 2018-12-02 DIAGNOSIS — I10 Essential (primary) hypertension: Secondary | ICD-10-CM | POA: Diagnosis not present

## 2018-12-02 DIAGNOSIS — E782 Mixed hyperlipidemia: Secondary | ICD-10-CM | POA: Diagnosis not present

## 2019-08-13 IMAGING — CR DG CERVICAL SPINE 2 OR 3 VIEWS
4 series · 4 of 4 positions shown · non-contrast
Comparison: None.

CLINICAL DATA: Stiffness in the neck for months.  No known injury.

EXAM:
CERVICAL SPINE - 2-3 VIEW

[w c-spine lat]
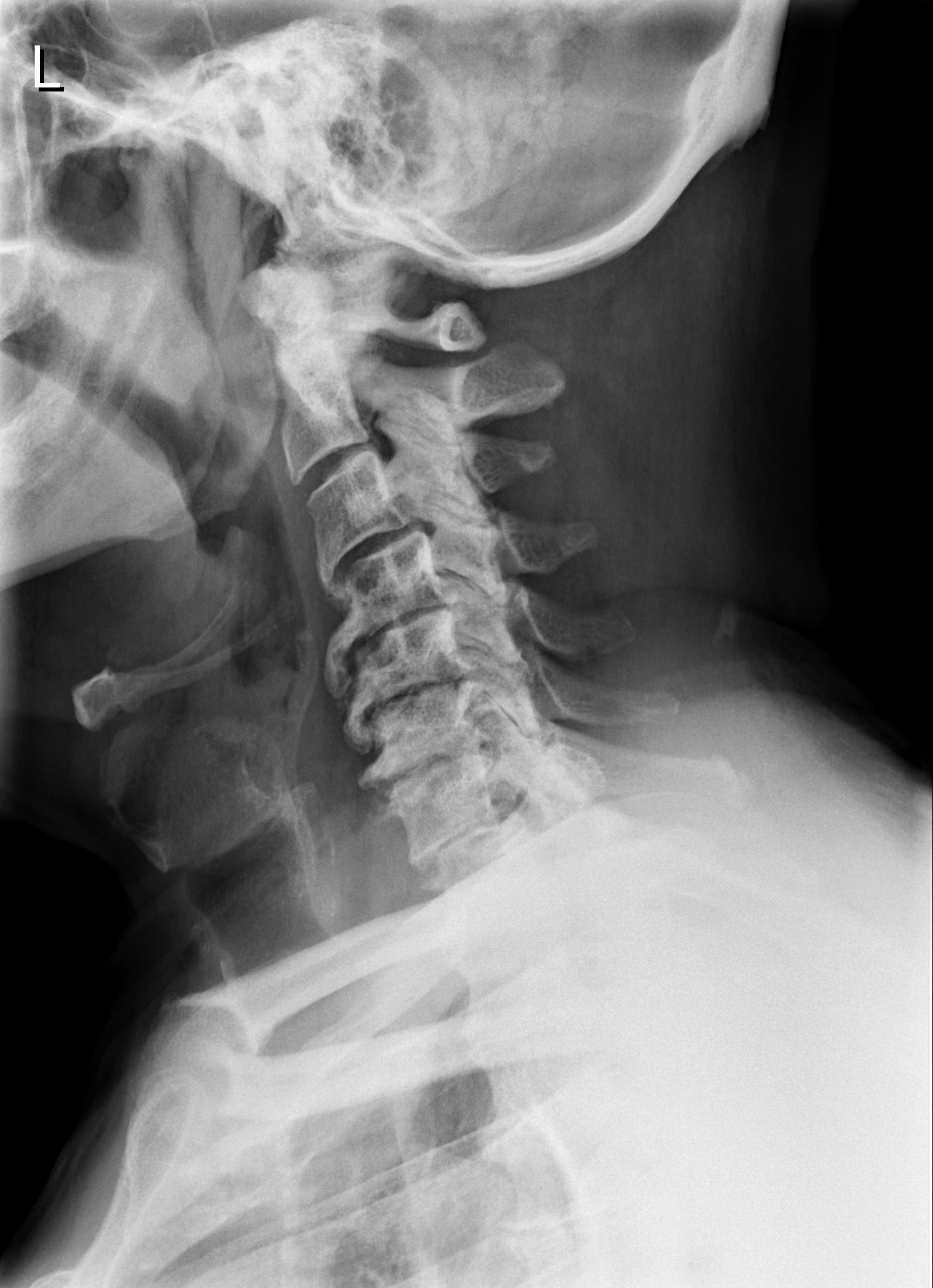

[w c-spine a.p. *]
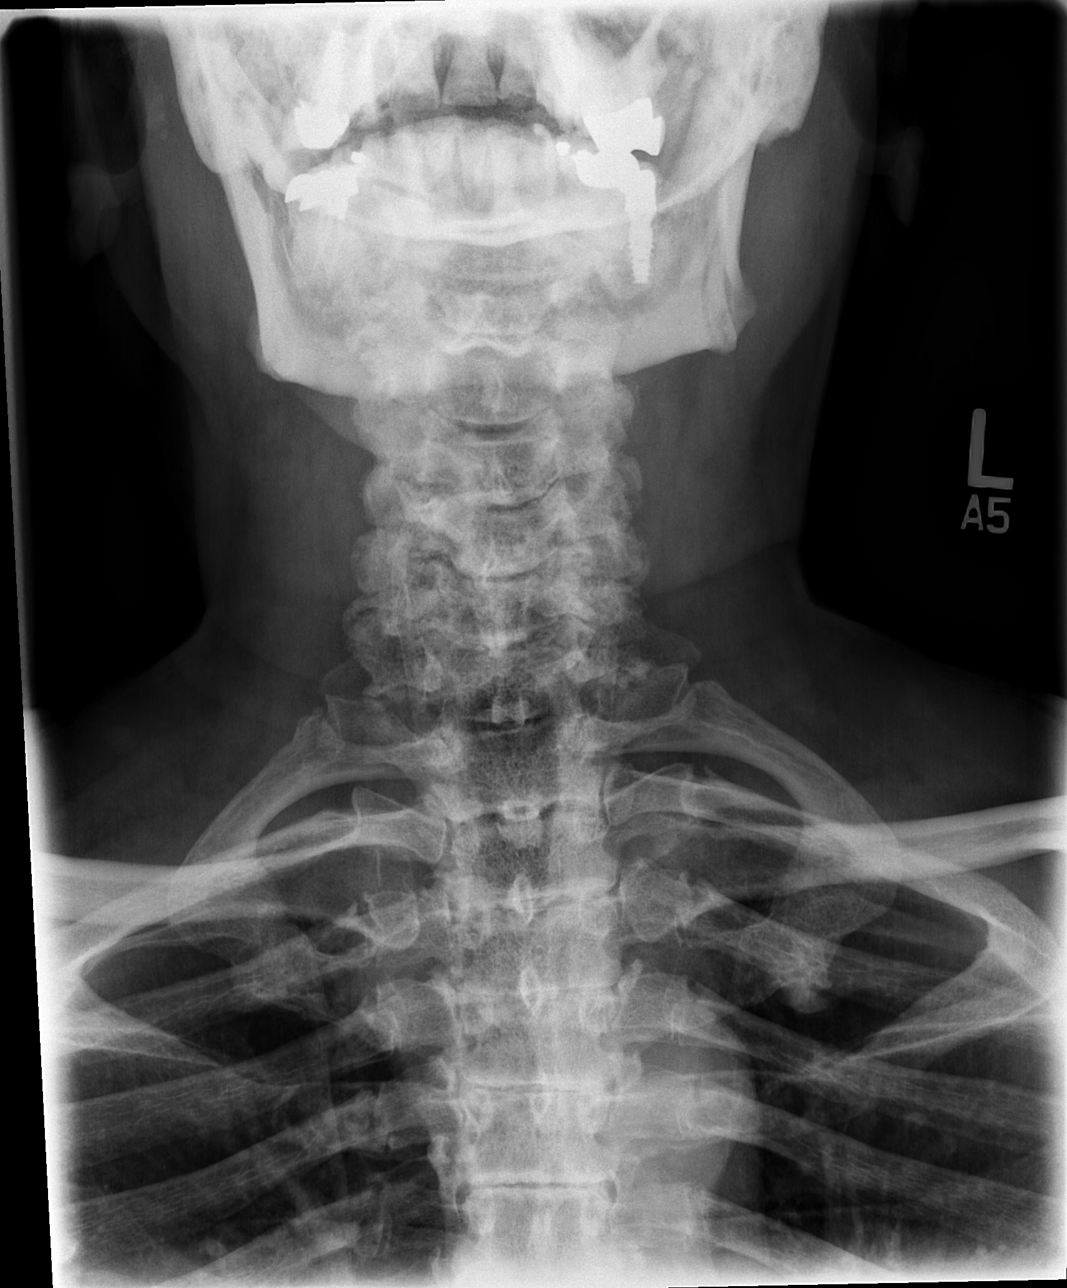

[w c-spine odontoid * (1 of 2)]
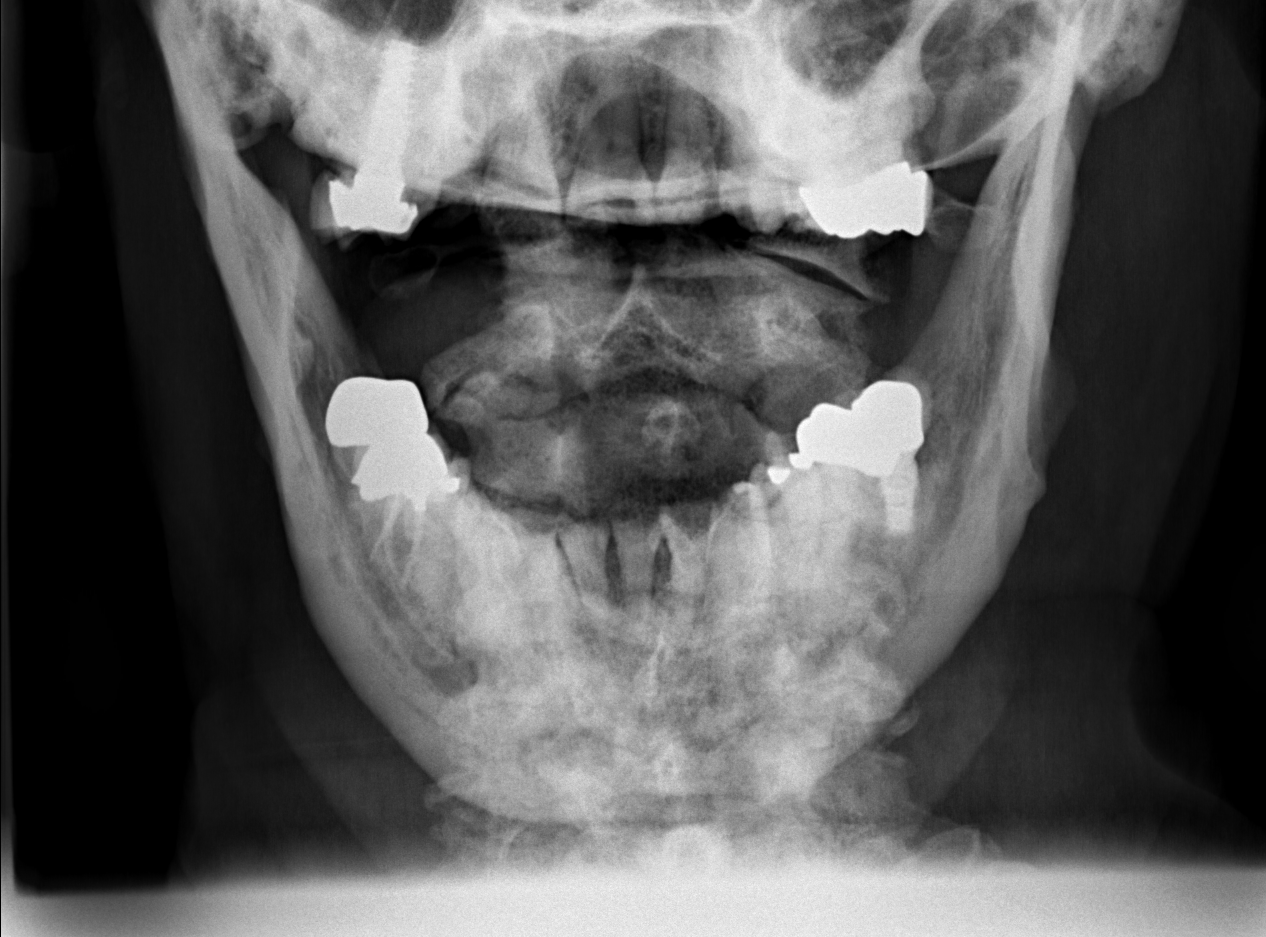

[w c-spine odontoid * (2 of 2)]
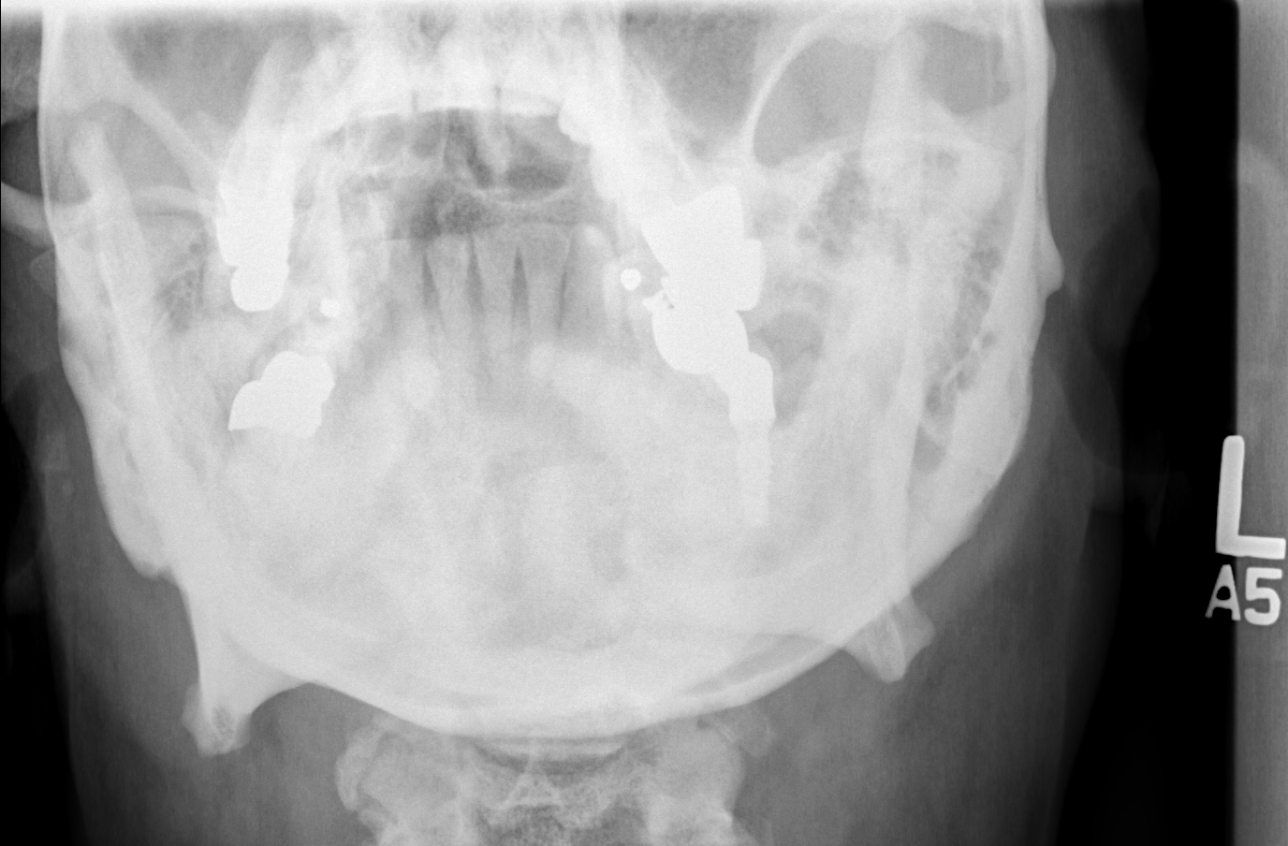

[4 of 4 positions shown; findings below may reference images not displayed]

FINDINGS: There is no evidence of cervical spine fracture or prevertebral soft
tissue swelling. 2 mm anterolisthesis C3 on C4. No other significant
bone abnormalities are identified. Degenerative disc disease with
disc height loss C4-5, C5-6, C6-7 C7-T1. Bilateral facet arthropathy
throughout the cervical spine.
IMPRESSION: Cervical spine spondylosis as described above.

## 2020-01-03 DIAGNOSIS — N401 Enlarged prostate with lower urinary tract symptoms: Secondary | ICD-10-CM | POA: Diagnosis not present

## 2020-01-03 DIAGNOSIS — I10 Essential (primary) hypertension: Secondary | ICD-10-CM | POA: Diagnosis not present

## 2020-01-03 DIAGNOSIS — N182 Chronic kidney disease, stage 2 (mild): Secondary | ICD-10-CM | POA: Diagnosis not present

## 2020-01-03 DIAGNOSIS — R42 Dizziness and giddiness: Secondary | ICD-10-CM | POA: Diagnosis not present

## 2020-01-03 DIAGNOSIS — N138 Other obstructive and reflux uropathy: Secondary | ICD-10-CM | POA: Diagnosis not present

## 2020-01-03 DIAGNOSIS — H04203 Unspecified epiphora, bilateral lacrimal glands: Secondary | ICD-10-CM | POA: Diagnosis not present

## 2020-01-25 DIAGNOSIS — I10 Essential (primary) hypertension: Secondary | ICD-10-CM | POA: Diagnosis not present

## 2020-01-25 DIAGNOSIS — R519 Headache, unspecified: Secondary | ICD-10-CM | POA: Diagnosis not present

## 2020-02-04 DIAGNOSIS — I10 Essential (primary) hypertension: Secondary | ICD-10-CM | POA: Diagnosis not present

## 2020-02-04 DIAGNOSIS — M542 Cervicalgia: Secondary | ICD-10-CM | POA: Diagnosis not present

## 2020-02-18 DIAGNOSIS — R931 Abnormal findings on diagnostic imaging of heart and coronary circulation: Secondary | ICD-10-CM | POA: Diagnosis not present

## 2020-02-18 DIAGNOSIS — I10 Essential (primary) hypertension: Secondary | ICD-10-CM | POA: Diagnosis not present

## 2020-02-18 DIAGNOSIS — M5030 Other cervical disc degeneration, unspecified cervical region: Secondary | ICD-10-CM | POA: Diagnosis not present

## 2020-02-22 DIAGNOSIS — R195 Other fecal abnormalities: Secondary | ICD-10-CM | POA: Diagnosis not present

## 2020-02-22 DIAGNOSIS — R931 Abnormal findings on diagnostic imaging of heart and coronary circulation: Secondary | ICD-10-CM | POA: Diagnosis not present

## 2020-02-22 DIAGNOSIS — E298 Other testicular dysfunction: Secondary | ICD-10-CM | POA: Diagnosis not present

## 2020-04-10 DIAGNOSIS — R350 Frequency of micturition: Secondary | ICD-10-CM | POA: Diagnosis not present

## 2020-04-10 DIAGNOSIS — N401 Enlarged prostate with lower urinary tract symptoms: Secondary | ICD-10-CM | POA: Diagnosis not present

## 2020-04-12 DIAGNOSIS — R931 Abnormal findings on diagnostic imaging of heart and coronary circulation: Secondary | ICD-10-CM | POA: Diagnosis not present

## 2020-04-12 DIAGNOSIS — N401 Enlarged prostate with lower urinary tract symptoms: Secondary | ICD-10-CM | POA: Diagnosis not present

## 2020-04-12 DIAGNOSIS — R310 Gross hematuria: Secondary | ICD-10-CM | POA: Diagnosis not present

## 2020-04-12 DIAGNOSIS — I272 Pulmonary hypertension, unspecified: Secondary | ICD-10-CM | POA: Diagnosis not present

## 2020-04-12 DIAGNOSIS — R7303 Prediabetes: Secondary | ICD-10-CM | POA: Diagnosis not present

## 2020-04-12 DIAGNOSIS — I10 Essential (primary) hypertension: Secondary | ICD-10-CM | POA: Diagnosis not present

## 2020-04-12 DIAGNOSIS — E782 Mixed hyperlipidemia: Secondary | ICD-10-CM | POA: Diagnosis not present

## 2020-04-12 DIAGNOSIS — R8290 Unspecified abnormal findings in urine: Secondary | ICD-10-CM | POA: Diagnosis not present

## 2020-04-12 DIAGNOSIS — E291 Testicular hypofunction: Secondary | ICD-10-CM | POA: Diagnosis not present

## 2020-04-12 DIAGNOSIS — E348 Other specified endocrine disorders: Secondary | ICD-10-CM | POA: Diagnosis not present

## 2020-04-12 DIAGNOSIS — R350 Frequency of micturition: Secondary | ICD-10-CM | POA: Diagnosis not present

## 2020-05-02 DIAGNOSIS — E291 Testicular hypofunction: Secondary | ICD-10-CM | POA: Diagnosis not present

## 2020-05-02 DIAGNOSIS — R351 Nocturia: Secondary | ICD-10-CM | POA: Diagnosis not present

## 2020-05-02 DIAGNOSIS — I10 Essential (primary) hypertension: Secondary | ICD-10-CM | POA: Diagnosis not present

## 2020-05-02 DIAGNOSIS — R350 Frequency of micturition: Secondary | ICD-10-CM | POA: Diagnosis not present

## 2020-05-02 DIAGNOSIS — N401 Enlarged prostate with lower urinary tract symptoms: Secondary | ICD-10-CM | POA: Diagnosis not present

## 2020-05-04 DIAGNOSIS — E782 Mixed hyperlipidemia: Secondary | ICD-10-CM | POA: Diagnosis not present

## 2020-05-04 DIAGNOSIS — D631 Anemia in chronic kidney disease: Secondary | ICD-10-CM | POA: Diagnosis not present

## 2020-05-04 DIAGNOSIS — N189 Chronic kidney disease, unspecified: Secondary | ICD-10-CM | POA: Diagnosis not present

## 2020-05-04 DIAGNOSIS — R7303 Prediabetes: Secondary | ICD-10-CM | POA: Diagnosis not present

## 2020-06-19 DIAGNOSIS — N3941 Urge incontinence: Secondary | ICD-10-CM | POA: Diagnosis not present

## 2020-07-19 DIAGNOSIS — N281 Cyst of kidney, acquired: Secondary | ICD-10-CM | POA: Diagnosis not present

## 2020-07-19 DIAGNOSIS — N3941 Urge incontinence: Secondary | ICD-10-CM | POA: Diagnosis not present

## 2020-07-19 DIAGNOSIS — R319 Hematuria, unspecified: Secondary | ICD-10-CM | POA: Diagnosis not present

## 2020-07-21 DIAGNOSIS — E291 Testicular hypofunction: Secondary | ICD-10-CM | POA: Diagnosis not present

## 2020-07-21 DIAGNOSIS — S39012A Strain of muscle, fascia and tendon of lower back, initial encounter: Secondary | ICD-10-CM | POA: Diagnosis not present

## 2020-07-21 DIAGNOSIS — N281 Cyst of kidney, acquired: Secondary | ICD-10-CM | POA: Diagnosis not present

## 2020-07-21 DIAGNOSIS — I10 Essential (primary) hypertension: Secondary | ICD-10-CM | POA: Diagnosis not present

## 2020-07-21 DIAGNOSIS — M545 Low back pain: Secondary | ICD-10-CM | POA: Diagnosis not present

## 2020-07-21 DIAGNOSIS — N400 Enlarged prostate without lower urinary tract symptoms: Secondary | ICD-10-CM | POA: Diagnosis not present

## 2020-08-07 DIAGNOSIS — M19042 Primary osteoarthritis, left hand: Secondary | ICD-10-CM | POA: Diagnosis not present

## 2020-08-07 DIAGNOSIS — M19041 Primary osteoarthritis, right hand: Secondary | ICD-10-CM | POA: Diagnosis not present

## 2020-08-23 DIAGNOSIS — M4319 Spondylolisthesis, multiple sites in spine: Secondary | ICD-10-CM | POA: Diagnosis not present

## 2020-08-23 DIAGNOSIS — M5136 Other intervertebral disc degeneration, lumbar region: Secondary | ICD-10-CM | POA: Diagnosis not present

## 2020-08-23 DIAGNOSIS — M7918 Myalgia, other site: Secondary | ICD-10-CM | POA: Diagnosis not present

## 2020-08-23 DIAGNOSIS — M47816 Spondylosis without myelopathy or radiculopathy, lumbar region: Secondary | ICD-10-CM | POA: Diagnosis not present

## 2020-08-23 DIAGNOSIS — G8929 Other chronic pain: Secondary | ICD-10-CM | POA: Diagnosis not present

## 2020-08-23 DIAGNOSIS — M542 Cervicalgia: Secondary | ICD-10-CM | POA: Diagnosis not present

## 2020-08-23 DIAGNOSIS — I878 Other specified disorders of veins: Secondary | ICD-10-CM | POA: Diagnosis not present

## 2020-08-23 DIAGNOSIS — M47812 Spondylosis without myelopathy or radiculopathy, cervical region: Secondary | ICD-10-CM | POA: Diagnosis not present

## 2020-08-23 DIAGNOSIS — M5031 Other cervical disc degeneration,  high cervical region: Secondary | ICD-10-CM | POA: Diagnosis not present

## 2020-08-23 DIAGNOSIS — M549 Dorsalgia, unspecified: Secondary | ICD-10-CM | POA: Diagnosis not present

## 2020-08-23 DIAGNOSIS — M545 Low back pain: Secondary | ICD-10-CM | POA: Diagnosis not present

## 2020-10-09 DIAGNOSIS — Z23 Encounter for immunization: Secondary | ICD-10-CM | POA: Diagnosis not present

## 2020-11-07 DIAGNOSIS — R42 Dizziness and giddiness: Secondary | ICD-10-CM | POA: Diagnosis not present

## 2020-11-07 DIAGNOSIS — H04129 Dry eye syndrome of unspecified lacrimal gland: Secondary | ICD-10-CM | POA: Diagnosis not present

## 2020-11-07 DIAGNOSIS — E291 Testicular hypofunction: Secondary | ICD-10-CM | POA: Diagnosis not present

## 2020-11-14 DIAGNOSIS — Z125 Encounter for screening for malignant neoplasm of prostate: Secondary | ICD-10-CM | POA: Diagnosis not present

## 2020-11-14 DIAGNOSIS — M2550 Pain in unspecified joint: Secondary | ICD-10-CM | POA: Diagnosis not present

## 2020-11-14 DIAGNOSIS — R5383 Other fatigue: Secondary | ICD-10-CM | POA: Diagnosis not present

## 2020-11-14 DIAGNOSIS — N401 Enlarged prostate with lower urinary tract symptoms: Secondary | ICD-10-CM | POA: Diagnosis not present

## 2020-11-14 DIAGNOSIS — R7303 Prediabetes: Secondary | ICD-10-CM | POA: Diagnosis not present

## 2020-11-14 DIAGNOSIS — I10 Essential (primary) hypertension: Secondary | ICD-10-CM | POA: Diagnosis not present

## 2020-11-14 DIAGNOSIS — E782 Mixed hyperlipidemia: Secondary | ICD-10-CM | POA: Diagnosis not present

## 2020-11-14 DIAGNOSIS — E291 Testicular hypofunction: Secondary | ICD-10-CM | POA: Diagnosis not present

## 2020-11-23 DIAGNOSIS — R413 Other amnesia: Secondary | ICD-10-CM | POA: Diagnosis not present

## 2020-11-28 DIAGNOSIS — N401 Enlarged prostate with lower urinary tract symptoms: Secondary | ICD-10-CM | POA: Diagnosis not present

## 2020-11-28 DIAGNOSIS — Z1211 Encounter for screening for malignant neoplasm of colon: Secondary | ICD-10-CM | POA: Diagnosis not present

## 2020-11-28 DIAGNOSIS — N410 Acute prostatitis: Secondary | ICD-10-CM | POA: Diagnosis not present

## 2020-11-28 DIAGNOSIS — R7303 Prediabetes: Secondary | ICD-10-CM | POA: Diagnosis not present

## 2020-11-28 DIAGNOSIS — E291 Testicular hypofunction: Secondary | ICD-10-CM | POA: Diagnosis not present

## 2020-12-04 DIAGNOSIS — N400 Enlarged prostate without lower urinary tract symptoms: Secondary | ICD-10-CM | POA: Diagnosis not present

## 2020-12-04 DIAGNOSIS — N3281 Overactive bladder: Secondary | ICD-10-CM | POA: Diagnosis not present

## 2020-12-04 DIAGNOSIS — R972 Elevated prostate specific antigen [PSA]: Secondary | ICD-10-CM | POA: Diagnosis not present

## 2020-12-04 DIAGNOSIS — N3941 Urge incontinence: Secondary | ICD-10-CM | POA: Diagnosis not present

## 2020-12-04 DIAGNOSIS — R8289 Other abnormal findings on cytological and histological examination of urine: Secondary | ICD-10-CM | POA: Diagnosis not present

## 2020-12-06 DIAGNOSIS — N400 Enlarged prostate without lower urinary tract symptoms: Secondary | ICD-10-CM | POA: Diagnosis not present

## 2020-12-06 DIAGNOSIS — N281 Cyst of kidney, acquired: Secondary | ICD-10-CM | POA: Diagnosis not present

## 2020-12-06 DIAGNOSIS — N3941 Urge incontinence: Secondary | ICD-10-CM | POA: Diagnosis not present

## 2020-12-25 DIAGNOSIS — E291 Testicular hypofunction: Secondary | ICD-10-CM | POA: Diagnosis not present

## 2020-12-25 DIAGNOSIS — N401 Enlarged prostate with lower urinary tract symptoms: Secondary | ICD-10-CM | POA: Diagnosis not present

## 2020-12-25 DIAGNOSIS — I10 Essential (primary) hypertension: Secondary | ICD-10-CM | POA: Diagnosis not present

## 2020-12-25 DIAGNOSIS — N182 Chronic kidney disease, stage 2 (mild): Secondary | ICD-10-CM | POA: Diagnosis not present

## 2020-12-25 DIAGNOSIS — E782 Mixed hyperlipidemia: Secondary | ICD-10-CM | POA: Diagnosis not present

## 2021-01-02 DIAGNOSIS — Z1212 Encounter for screening for malignant neoplasm of rectum: Secondary | ICD-10-CM | POA: Diagnosis not present

## 2021-01-02 DIAGNOSIS — Z1211 Encounter for screening for malignant neoplasm of colon: Secondary | ICD-10-CM | POA: Diagnosis not present

## 2021-01-05 DIAGNOSIS — R519 Headache, unspecified: Secondary | ICD-10-CM | POA: Diagnosis not present

## 2021-01-08 DIAGNOSIS — E291 Testicular hypofunction: Secondary | ICD-10-CM | POA: Diagnosis not present

## 2021-01-08 DIAGNOSIS — N3281 Overactive bladder: Secondary | ICD-10-CM | POA: Diagnosis not present

## 2021-01-08 DIAGNOSIS — N289 Disorder of kidney and ureter, unspecified: Secondary | ICD-10-CM | POA: Diagnosis not present

## 2021-01-08 DIAGNOSIS — R972 Elevated prostate specific antigen [PSA]: Secondary | ICD-10-CM | POA: Diagnosis not present

## 2021-01-08 DIAGNOSIS — N400 Enlarged prostate without lower urinary tract symptoms: Secondary | ICD-10-CM | POA: Diagnosis not present

## 2021-01-08 DIAGNOSIS — N3941 Urge incontinence: Secondary | ICD-10-CM | POA: Diagnosis not present

## 2021-01-08 DIAGNOSIS — R319 Hematuria, unspecified: Secondary | ICD-10-CM | POA: Diagnosis not present

## 2021-01-31 DIAGNOSIS — R519 Headache, unspecified: Secondary | ICD-10-CM | POA: Diagnosis not present

## 2021-01-31 DIAGNOSIS — E7800 Pure hypercholesterolemia, unspecified: Secondary | ICD-10-CM | POA: Diagnosis not present

## 2021-01-31 DIAGNOSIS — H409 Unspecified glaucoma: Secondary | ICD-10-CM | POA: Diagnosis not present

## 2021-01-31 DIAGNOSIS — G4733 Obstructive sleep apnea (adult) (pediatric): Secondary | ICD-10-CM | POA: Diagnosis not present

## 2021-01-31 DIAGNOSIS — N1831 Chronic kidney disease, stage 3a: Secondary | ICD-10-CM | POA: Diagnosis not present

## 2021-01-31 DIAGNOSIS — E291 Testicular hypofunction: Secondary | ICD-10-CM | POA: Diagnosis not present

## 2021-01-31 DIAGNOSIS — E538 Deficiency of other specified B group vitamins: Secondary | ICD-10-CM | POA: Diagnosis not present

## 2021-01-31 DIAGNOSIS — I129 Hypertensive chronic kidney disease with stage 1 through stage 4 chronic kidney disease, or unspecified chronic kidney disease: Secondary | ICD-10-CM | POA: Diagnosis not present

## 2021-01-31 DIAGNOSIS — N400 Enlarged prostate without lower urinary tract symptoms: Secondary | ICD-10-CM | POA: Diagnosis not present

## 2021-01-31 DIAGNOSIS — M109 Gout, unspecified: Secondary | ICD-10-CM | POA: Diagnosis not present

## 2021-02-02 ENCOUNTER — Other Ambulatory Visit: Payer: Self-pay | Admitting: Internal Medicine

## 2021-02-02 DIAGNOSIS — R519 Headache, unspecified: Secondary | ICD-10-CM

## 2021-02-14 ENCOUNTER — Ambulatory Visit
Admission: RE | Admit: 2021-02-14 | Discharge: 2021-02-14 | Disposition: A | Discharge status: Home / Self Care | Payer: Medicare Other | Source: Ambulatory Visit | Attending: Internal Medicine | Admitting: Internal Medicine

## 2021-02-14 ENCOUNTER — Other Ambulatory Visit: Payer: Self-pay

## 2021-02-14 DIAGNOSIS — R519 Headache, unspecified: Secondary | ICD-10-CM

## 2021-02-21 DIAGNOSIS — E291 Testicular hypofunction: Secondary | ICD-10-CM | POA: Diagnosis not present

## 2021-02-21 DIAGNOSIS — N400 Enlarged prostate without lower urinary tract symptoms: Secondary | ICD-10-CM | POA: Diagnosis not present

## 2021-03-07 DIAGNOSIS — E291 Testicular hypofunction: Secondary | ICD-10-CM | POA: Diagnosis not present

## 2021-03-20 DIAGNOSIS — E291 Testicular hypofunction: Secondary | ICD-10-CM | POA: Diagnosis not present

## 2021-05-09 DIAGNOSIS — H938X3 Other specified disorders of ear, bilateral: Secondary | ICD-10-CM | POA: Diagnosis not present

## 2021-05-09 DIAGNOSIS — N400 Enlarged prostate without lower urinary tract symptoms: Secondary | ICD-10-CM | POA: Diagnosis not present

## 2021-05-09 DIAGNOSIS — R609 Edema, unspecified: Secondary | ICD-10-CM | POA: Diagnosis not present

## 2021-05-10 DIAGNOSIS — E291 Testicular hypofunction: Secondary | ICD-10-CM | POA: Diagnosis not present

## 2021-05-10 DIAGNOSIS — H6121 Impacted cerumen, right ear: Secondary | ICD-10-CM | POA: Diagnosis not present

## 2021-05-23 DIAGNOSIS — E291 Testicular hypofunction: Secondary | ICD-10-CM | POA: Diagnosis not present

## 2021-05-29 DIAGNOSIS — K5900 Constipation, unspecified: Secondary | ICD-10-CM | POA: Diagnosis not present

## 2021-05-29 DIAGNOSIS — E291 Testicular hypofunction: Secondary | ICD-10-CM | POA: Diagnosis not present

## 2021-06-12 DIAGNOSIS — E291 Testicular hypofunction: Secondary | ICD-10-CM | POA: Diagnosis not present

## 2021-06-26 DIAGNOSIS — E291 Testicular hypofunction: Secondary | ICD-10-CM | POA: Diagnosis not present

## 2021-07-23 DIAGNOSIS — Z0000 Encounter for general adult medical examination without abnormal findings: Secondary | ICD-10-CM | POA: Diagnosis not present

## 2021-07-23 DIAGNOSIS — E291 Testicular hypofunction: Secondary | ICD-10-CM | POA: Diagnosis not present

## 2021-07-23 DIAGNOSIS — M109 Gout, unspecified: Secondary | ICD-10-CM | POA: Diagnosis not present

## 2021-07-23 DIAGNOSIS — Z1389 Encounter for screening for other disorder: Secondary | ICD-10-CM | POA: Diagnosis not present

## 2021-07-23 DIAGNOSIS — E7800 Pure hypercholesterolemia, unspecified: Secondary | ICD-10-CM | POA: Diagnosis not present

## 2021-07-23 DIAGNOSIS — N1831 Chronic kidney disease, stage 3a: Secondary | ICD-10-CM | POA: Diagnosis not present

## 2021-07-23 DIAGNOSIS — N400 Enlarged prostate without lower urinary tract symptoms: Secondary | ICD-10-CM | POA: Diagnosis not present

## 2021-07-23 DIAGNOSIS — R519 Headache, unspecified: Secondary | ICD-10-CM | POA: Diagnosis not present

## 2021-07-23 DIAGNOSIS — K5900 Constipation, unspecified: Secondary | ICD-10-CM | POA: Diagnosis not present

## 2021-07-23 DIAGNOSIS — G4733 Obstructive sleep apnea (adult) (pediatric): Secondary | ICD-10-CM | POA: Diagnosis not present

## 2021-07-23 DIAGNOSIS — I129 Hypertensive chronic kidney disease with stage 1 through stage 4 chronic kidney disease, or unspecified chronic kidney disease: Secondary | ICD-10-CM | POA: Diagnosis not present

## 2021-08-20 DIAGNOSIS — E291 Testicular hypofunction: Secondary | ICD-10-CM | POA: Diagnosis not present

## 2021-08-21 DIAGNOSIS — U071 COVID-19: Secondary | ICD-10-CM | POA: Diagnosis not present

## 2021-08-29 ENCOUNTER — Ambulatory Visit: Payer: Medicare Other | Attending: Internal Medicine | Admitting: Rehabilitative and Restorative Service Providers"

## 2021-08-29 ENCOUNTER — Other Ambulatory Visit: Payer: Self-pay

## 2021-08-29 ENCOUNTER — Encounter: Payer: Self-pay | Admitting: Rehabilitative and Restorative Service Providers"

## 2021-08-29 DIAGNOSIS — M6281 Muscle weakness (generalized): Secondary | ICD-10-CM | POA: Diagnosis not present

## 2021-08-29 DIAGNOSIS — M545 Low back pain, unspecified: Secondary | ICD-10-CM | POA: Diagnosis not present

## 2021-08-29 DIAGNOSIS — G8929 Other chronic pain: Secondary | ICD-10-CM | POA: Insufficient documentation

## 2021-08-29 DIAGNOSIS — R262 Difficulty in walking, not elsewhere classified: Secondary | ICD-10-CM | POA: Diagnosis not present

## 2021-08-29 DIAGNOSIS — R252 Cramp and spasm: Secondary | ICD-10-CM | POA: Diagnosis not present

## 2021-08-29 DIAGNOSIS — R2689 Other abnormalities of gait and mobility: Secondary | ICD-10-CM | POA: Insufficient documentation

## 2021-08-29 DIAGNOSIS — M542 Cervicalgia: Secondary | ICD-10-CM | POA: Diagnosis not present

## 2021-08-29 NOTE — Therapy (Signed)
[place], [place], [address] Phone: [phone]   Fax:  [phone]  Physical Therapy Evaluation  Patient Details  Name: Marvin West MRN: [id number] Date of Birth: 1934-11-08 Referring Provider (PT): Dr Lavone Orn   Encounter Date: 08/29/2021   PT End of Session - 08/29/21 1317     Visit Number 1    Number of Visits 16    Date for PT Re-Evaluation 10/19/21    Authorization Type MCR    PT Start Time 1230    PT Stop Time 1310    PT Time Calculation (min) 40 min    Activity Tolerance Patient tolerated treatment well    Behavior During Therapy [facility] for tasks assessed/performed             Past Medical History:  Diagnosis Date   Arthritis    Benign localized prostatic hyperplasia with lower urinary tract symptoms (LUTS)    Chronic back pain    CKD (chronic kidney disease), stage III ([facility])    Diverticulosis    DJD (degenerative joint disease)    ED (erectile dysfunction)    GERD (gastroesophageal reflux disease)    Gout    per last attack over a year 2016 approx.   Hemorrhoids    Hyperlipidemia    Hypertension    Hypogonadism in male    OSA on CPAP    Rectal bleeding    Wears glasses     Past Surgical History:  Procedure Laterality Date   ANAL FISTULOTOMY  07-04-2003     [facility]   and Internal Hemorrhoid Banding   CATARACT EXTRACTION W/ INTRAOCULAR LENS  IMPLANT, BILATERAL  2013 approx.   COLONOSCOPY WITH PROPOFOL N/A 07/27/2013   Procedure: COLONOSCOPY WITH PROPOFOL;  Surgeon: Garlan Fair, MD;  [facility];  Service: Endoscopy;  Laterality: N/A;   HEMORRHOID SURGERY N/A 10/02/2017   Procedure: HEMORRHOIDECTOMY, Anterior and Posterior;  Surgeon: Leighton Ruff, MD;  Location: [facility];  Service: General;  Laterality: N/A;   INCISION AND DRAINAGE PERIRECTAL ABSCESS  03-22-2003     [facility]   INGUINAL HERNIA REPAIR Right 08-14-2004    dr Ninfa Linden [place]  03/ 2015        [facility])   NASAL SINUS SURGERY  07/29/2001   THUMB ARTHROTOMY/  REMOVAL MULTIPLE LOOSE BODIES/ SYNVECTOMY/  RESECTION MUCOID CYST Left 01-16-2010    dr sypher  [place] Bilateral    [place]    There were no vitals filed for this visit.    Subjective Assessment - 08/29/21 1242     Subjective Pt reports having chronic Hx of back and neck pain.  The pain comes and goes, but states that for several months, his pain has been getting worse.  He reports that he changed his pillow 2 weeks ago and his neck pain has improved.  However, he continues to have back pain. He reports that his balance has gotten worse over time.    Pertinent History OA    Limitations Lifting;House hold activities;Walking    Patient Stated Goals To feel better and have less pain and improve balance.    Currently in Pain? Yes    Pain Score 5     Pain Location Neck    Pain Orientation Mid;Left    Pain Descriptors / Indicators Aching;Sharp;Cramping    Pain Type Chronic  pain    Pain Onset More than a month ago    Pain Frequency Intermittent    Multiple Pain Sites Yes    Pain Score 8    Pain Location Back    Pain Orientation Mid;Lower    Pain Descriptors / Indicators Aching;Sharp;Throbbing;Tightness    Pain Type Chronic pain    Pain Onset More than a month ago    Pain Frequency Constant                [facility] PT Assessment - 08/29/21 0001       Assessment   Medical Diagnosis M15.9 (ICD-10-CM) - Polyosteoarthritis, unspecified    Referring Provider (PT) Dr Lavone Orn    Hand Dominance Right    Prior Therapy for the neck       Precautions   Precautions Fall      Restrictions   Weight Bearing Restrictions No      Balance Screen   Has the patient fallen in the past 6 months No    Has the patient had a decrease in activity level because of a fear of falling?  No    Is the patient reluctant to leave  their home because of a fear of falling?  No      Home Environment   Living Environment Private residence    Living Arrangements Alone    Type of [place] Access Level entry    Home Layout One level      Prior Function   Level of [place] Retired    Leisure Pt reports that he just moved back here and he has signed up for the gym, but hasn't started back yet.      Cognition   Overall Cognitive Status Within Functional Limits for tasks assessed      Observation/Other Assessments   Focus on Therapeutic Outcomes (FOTO)  40%      AROM   Overall AROM Comments Lumbar ROM limited approx 25%, Cervical ROM limited approx 50%      Strength   Overall Strength Comments RLE strength grossly 4+/5, LLE strength grossly 4/5      Flexibility   Hamstrings tightness noted bilaterally      Transfers   Five time sit to stand comments  28.3 sec with UE      Standardized Balance Assessment   Standardized Balance Assessment Berg Balance Test      Berg Balance Test   Sit to Stand Able to stand  independently using hands    Standing Unsupported Able to stand safely 2 minutes    Sitting with Back Unsupported but Feet Supported on Floor or Stool Able to sit safely and securely 2 minutes    Stand to Sit Uses backs of legs against chair to control descent    Transfers Able to transfer safely, definite need of hands    Standing Unsupported with Eyes Closed Able to stand 10 seconds with supervision    Standing Unsupported with Feet Together Able to place feet together independently and stand for 1 minute with supervision    From Standing, Reach Forward with Outstretched Arm Can reach forward >5 cm safely (2")    From Standing Position, Pick up Object from Floor Unable to pick up and needs supervision    From Standing Position, Turn to Look Behind Over each Shoulder Turn sideways only but maintains balance    Turn 360 Degrees Able to turn 360 degrees safely  but  slowly    Standing Unsupported, Alternately Place Feet on Step/Stool Able to complete >2 steps/needs minimal assist    Standing Unsupported, One Foot in Front Able to take small step independently and hold 30 seconds    Standing on One Leg Tries to lift leg/unable to hold 3 seconds but remains standing independently    Total Score 33                        Objective measurements completed on examination: See above findings.       [facility] Adult PT Treatment/Exercise - 08/29/21 0001       Exercises   Exercises Lumbar      Neck Exercises: Standing   Neck Retraction 5 reps      Lumbar Exercises: Standing   Other Standing Lumbar Exercises 3 way hip x5 each    Other Standing Lumbar Exercises Marching x5 bilat                  Upper Extremity Functional Index Score:  /80   PT Education - 08/29/21 1309     Education provided Yes    Education Details issued HEP    Person(s) Educated Patient    Methods Explanation;Demonstration;Handout    Comprehension Verbalized understanding              PT Short Term Goals - 08/29/21 1325       PT SHORT TERM GOAL #1   Title He will be independent with initial HEP    Time 2    Period Weeks    Status New               PT Long Term Goals - 08/29/21 1326       PT LONG TERM GOAL #1   Title He will be independent with advanced HEP and safe with a gym    Time 8    Period Weeks    Status New      PT LONG TERM GOAL #2   Title He will report pain decreased 75% or more with   rotation to look for traffic    Time 8    Period Weeks    Status New      PT LONG TERM GOAL #3   Title Pt will increase BERG to at least 48/56 to place him at a low risk of falling.    Time 8    Period Weeks    Status New      PT LONG TERM GOAL #4   Title Pt will increase FOTO to 45%.    Time 8    Period Weeks    Status New      PT LONG TERM GOAL #5   Title Pt will increase BLE to at least 4+/5 to allow him to return to  the gym or golf.    Time 8    Period Weeks    Status New                    Plan - 08/29/21 1320     Clinical Impression Statement Pt is an 87 y.o. male referred to outpatient PT from Dr Laurann [place] with a diagnosis of polyosteoarthritis.  His PLOF was able to go to the gym and go golfing.  He states that he recently moved back to this area and has been having increased pain while his is moving boxes. He presents with primarily cervical  and lumbar pain during session today stating that he feels that he has been walking off balance more lately.  Pt with BERG of 33/56 to place him at a risk of falling.  Additionally, pt with decreased strength, decreased ROM, muscle tightness, and difficulty performing functional mobility.  Mr Esper would benefit from skilled PT to address his functional impairments to allow him to be safer and more independent and return to the gym and golf.    Personal Factors and Comorbidities Age    Examination-Activity Limitations Locomotion Level;Lift    Examination-Participation Restrictions Community Activity    Stability/Clinical Decision Making Evolving/Moderate complexity    Clinical Decision Making Moderate    Rehab Potential Good    PT Frequency 2x / week    PT Duration 8 weeks    PT Treatment/Interventions Manual techniques;Dry needling;Moist Heat;Ultrasound;Traction;Passive range of motion;Patient/family education;Electrical Stimulation;Cryotherapy;ADLs/Self Care Home Management;Neuromuscular re-education;Therapeutic exercise;Therapeutic activities;Iontophoresis '4mg'$ /ml Dexamethasone;Gait training;Stair training;Functional mobility training;Balance training;Taping;Spinal Manipulations;Joint Manipulations    PT Next Visit Plan Assess and progress HEP, core stability, flexibility, strengthening    PT Home Exercise Plan Access Code: [email or web address]    Consulted and Agree with Plan of Care Patient             Patient will benefit from skilled therapeutic  intervention in order to improve the following deficits and impairments:  Decreased activity tolerance, Postural dysfunction, Increased muscle spasms, Pain, Difficulty walking, Impaired flexibility, Decreased mobility, Decreased strength, Decreased range of motion, Decreased balance  Visit Diagnosis: Cervicalgia - Plan: PT plan of care cert/re-cert  Chronic low back pain, unspecified back pain laterality, unspecified whether sciatica present - Plan: PT plan of care cert/re-cert  Muscle weakness (generalized) - Plan: PT plan of care cert/re-cert  Balance disorder - Plan: PT plan of care cert/re-cert  Cramp and spasm - Plan: PT plan of care cert/re-cert  Difficulty in walking, not elsewhere classified - Plan: PT plan of care cert/re-cert     Problem List Patient Active Problem List   Diagnosis Date Noted   HEARTBURN 05/09/2008   ABDOMINAL BLOATING 05/09/2008   HYPERTENSION 05/05/2008   CONSTIPATION 05/05/2008   ARTHRITIS 05/05/2008   SLEEP APNEA 05/05/2008   HERNIORRHAPHY, HX OF 05/05/2008    Juel Burrow, PT 08/29/2021, 1:31 PM  [place] [address] [place], [place], [address] Phone: [phone]   Fax:  [phone]  Name: Marvin West MRN: [id number] Date of Birth: 10/26/1934

## 2021-08-29 NOTE — Patient Instructions (Signed)
Access Code: [email or web address] URL: [email or web address] Date: 08/29/2021 Prepared by: Juel Burrow  Exercises Standing March with Counter Support - 2 x daily - 7 x weekly - 2 sets - 10 reps Standing Hip Flexion with Counter Support - 2 x daily - 7 x weekly - 2 sets - 10 reps Standing Hip Abduction with Counter Support - 2 x daily - 7 x weekly - 2 sets - 10 reps Standing Hip Extension with Counter Support - 2 x daily - 7 x weekly - 2 sets - 10 reps Seated Cervical Retraction - 2 x daily - 7 x weekly - 2 sets - 10 reps

## 2021-09-03 DIAGNOSIS — E291 Testicular hypofunction: Secondary | ICD-10-CM | POA: Diagnosis not present

## 2021-09-04 ENCOUNTER — Encounter: Payer: Self-pay | Admitting: Physical Therapy

## 2021-09-04 ENCOUNTER — Ambulatory Visit: Payer: Medicare Other | Admitting: Physical Therapy

## 2021-09-04 ENCOUNTER — Other Ambulatory Visit: Payer: Self-pay

## 2021-09-04 DIAGNOSIS — G8929 Other chronic pain: Secondary | ICD-10-CM | POA: Diagnosis not present

## 2021-09-04 DIAGNOSIS — R2689 Other abnormalities of gait and mobility: Secondary | ICD-10-CM | POA: Diagnosis not present

## 2021-09-04 DIAGNOSIS — R252 Cramp and spasm: Secondary | ICD-10-CM | POA: Diagnosis not present

## 2021-09-04 DIAGNOSIS — M545 Low back pain, unspecified: Secondary | ICD-10-CM

## 2021-09-04 DIAGNOSIS — M542 Cervicalgia: Secondary | ICD-10-CM

## 2021-09-04 DIAGNOSIS — M6281 Muscle weakness (generalized): Secondary | ICD-10-CM | POA: Diagnosis not present

## 2021-09-04 NOTE — Therapy (Signed)
[place] [place], [place], [address] Phone: [phone]   Fax:  [phone]  Physical Therapy Treatment  Patient Details  Name: Marvin West MRN: [id number] Date of Birth: 1934-07-04 Referring Provider (PT): Dr Lavone Orn   Encounter Date: 09/04/2021   PT End of Session - 09/04/21 1015     Visit Number 2    Number of Visits 16    Date for PT Re-Evaluation 10/19/21    PT Start Time 0930    PT Stop Time [id number]    PT Time Calculation (min) 45 min    Activity Tolerance Patient tolerated treatment well    Behavior During Therapy [facility] for tasks assessed/performed             Past Medical History:  Diagnosis Date   Arthritis    Benign localized prostatic hyperplasia with lower urinary tract symptoms (LUTS)    Chronic back pain    CKD (chronic kidney disease), stage III (HCC)    Diverticulosis    DJD (degenerative joint disease)    ED (erectile dysfunction)    GERD (gastroesophageal reflux disease)    Gout    per last attack over a year 2016 approx.   Hemorrhoids    Hyperlipidemia    Hypertension    Hypogonadism in male    OSA on CPAP    Rectal bleeding    Wears glasses     Past Surgical History:  Procedure Laterality Date   ANAL FISTULOTOMY  07-04-2003     [facility]   and Internal Hemorrhoid Banding   CATARACT EXTRACTION W/ INTRAOCULAR LENS  IMPLANT, BILATERAL  2013 approx.   COLONOSCOPY WITH PROPOFOL N/A 07/27/2013   Procedure: COLONOSCOPY WITH PROPOFOL;  Surgeon: Garlan Fair, MD;  Location: [facility];  Service: Endoscopy;  Laterality: N/A;   HEMORRHOID SURGERY N/A 10/02/2017   Procedure: HEMORRHOIDECTOMY, Anterior and Posterior;  Surgeon: Leighton Ruff, MD;  Location: [facility];  Service: General;  Laterality: N/A;   INCISION AND DRAINAGE PERIRECTAL ABSCESS  03-22-2003     [facility]   INGUINAL HERNIA REPAIR Right 08-14-2004    dr Ninfa Linden [place]  03/ 2015         [facility])   NASAL SINUS SURGERY  07/29/2001   THUMB ARTHROTOMY/  REMOVAL MULTIPLE LOOSE BODIES/ SYNVECTOMY/  RESECTION MUCOID CYST Left 01-16-2010    dr sypher  [place] Bilateral    [place]    There were no vitals filed for this visit.   Subjective Assessment - 09/04/21 0929     Subjective back and neck stiffness. Neck pain not as severe changed pillows.    Currently in Pain? Yes    Pain Score 6    all over                              [facility] Adult PT Treatment/Exercise - 09/04/21 0001       Neck Exercises: Machines for Strengthening   UBE (Upper Arm Bike) L1.7 x 3 min each      Neck Exercises: Seated   Other Seated Exercise neck retractions 2x10    Other Seated Exercise ER red 2x10, Seated OHP yellow ball 2x10      Lumbar Exercises: Aerobic   Recumbent Bike L1.7 x 2 min      Lumbar  Exercises: Standing   Row Strengthening;20 reps;Theraband;Both    Theraband Level (Row) Level 2 (Red)    Shoulder Extension Strengthening;Both;20 reps;Theraband    Theraband Level (Shoulder Extension) Level 2 (Red)      Lumbar Exercises: Seated   Other Seated Lumbar Exercises Horiz Abd red 2x10                       PT Short Term Goals - 08/29/21 1325       PT SHORT TERM GOAL #1   Title He will be independent with initial HEP    Time 2    Period Weeks    Status New               PT Long Term Goals - 08/29/21 1326       PT LONG TERM GOAL #1   Title He will be independent with advanced HEP and safe with a gym    Time 8    Period Weeks    Status New      PT LONG TERM GOAL #2   Title He will report pain decreased 75% or more with   rotation to look for traffic    Time 8    Period Weeks    Status New      PT LONG TERM GOAL #3   Title Pt will increase BERG to at least 48/56 to place him at a low risk of falling.    Time 8    Period Weeks    Status New       PT LONG TERM GOAL #4   Title Pt will increase FOTO to 45%.    Time 8    Period Weeks    Status New      PT LONG TERM GOAL #5   Title Pt will increase BLE to at least 4+/5 to allow him to return to the gym or golf.    Time 8    Period Weeks    Status New                   Plan - 09/04/21 1016     Clinical Impression Statement pt tolerated an initial progression to TE well. Cue needed for anterior weight shift with sit to stands not to allow LEs to push against table. No reports of increase pain. HE can perform cervical retractions but with limited ROM. Tactile cue to elbows needed for UE position during ER. Some L shoulder discomfort in clavicle area with OHP.    Personal Factors and Comorbidities Age    Examination-Activity Limitations Locomotion Level;Lift    Examination-Participation Restrictions Community Activity    Stability/Clinical Decision Making Evolving/Moderate complexity    Rehab Potential Good    PT Frequency 2x / week    PT Treatment/Interventions Manual techniques;Dry needling;Moist Heat;Ultrasound;Traction;Passive range of motion;Patient/family education;Electrical Stimulation;Cryotherapy;ADLs/Self Care Home Management;Neuromuscular re-education;Therapeutic exercise;Therapeutic activities;Iontophoresis '4mg'$ /ml Dexamethasone;Gait training;Stair training;Functional mobility training;Balance training;Taping;Spinal Manipulations;Joint Manipulations    PT Next Visit Plan Assess and progress HEP, core stability, flexibility, strengthening             Patient will benefit from skilled therapeutic intervention in order to improve the following deficits and impairments:  Decreased activity tolerance, Postural dysfunction, Increased muscle spasms, Pain, Difficulty walking, Impaired flexibility, Decreased mobility, Decreased strength, Decreased range of motion, Decreased balance  Visit Diagnosis: Cervicalgia  Chronic low back pain, unspecified back pain laterality,  unspecified whether sciatica present  Muscle weakness (generalized)  Problem List Patient Active Problem List   Diagnosis Date Noted   HEARTBURN 05/09/2008   ABDOMINAL BLOATING 05/09/2008   HYPERTENSION 05/05/2008   CONSTIPATION 05/05/2008   ARTHRITIS 05/05/2008   SLEEP APNEA 05/05/2008   HERNIORRHAPHY, HX OF 05/05/2008    Scot Jun, PTA 09/04/2021, 10:18 AM  [place]. [place], [place], [address] Phone: [phone]   Fax:  [phone]  Name: Marvin West MRN: [id number] Date of Birth: Nov 24, 1934

## 2021-09-10 ENCOUNTER — Ambulatory Visit: Payer: Medicare Other | Admitting: Physical Therapy

## 2021-09-12 ENCOUNTER — Ambulatory Visit: Payer: Medicare Other | Admitting: Physical Therapy

## 2021-09-13 ENCOUNTER — Other Ambulatory Visit: Payer: Self-pay

## 2021-09-13 ENCOUNTER — Encounter: Payer: Self-pay | Admitting: Physical Therapy

## 2021-09-13 ENCOUNTER — Ambulatory Visit: Payer: Medicare Other | Admitting: Physical Therapy

## 2021-09-13 DIAGNOSIS — G8929 Other chronic pain: Secondary | ICD-10-CM

## 2021-09-13 DIAGNOSIS — R252 Cramp and spasm: Secondary | ICD-10-CM | POA: Diagnosis not present

## 2021-09-13 DIAGNOSIS — M545 Low back pain, unspecified: Secondary | ICD-10-CM | POA: Diagnosis not present

## 2021-09-13 DIAGNOSIS — M6281 Muscle weakness (generalized): Secondary | ICD-10-CM | POA: Diagnosis not present

## 2021-09-13 DIAGNOSIS — R2689 Other abnormalities of gait and mobility: Secondary | ICD-10-CM | POA: Diagnosis not present

## 2021-09-13 DIAGNOSIS — M542 Cervicalgia: Secondary | ICD-10-CM

## 2021-09-13 NOTE — Therapy (Signed)
[place]. [place], [place], [address] Phone: [phone]   Fax:  [phone]  Physical Therapy Treatment  Patient Details  Name: Marvin West MRN: [id number] Date of Birth: 07/13/1934 Referring Provider (PT): Dr Lavone Orn   Encounter Date: 09/13/2021   PT End of Session - 09/13/21 1634     Visit Number 3    Number of Visits 16    Date for PT Re-Evaluation 10/19/21    PT Start Time 1600    PT Stop Time 1635    PT Time Calculation (min) 35 min    Activity Tolerance Patient tolerated treatment well    Behavior During Therapy [facility] for tasks assessed/performed             Past Medical History:  Diagnosis Date   Arthritis    Benign localized prostatic hyperplasia with lower urinary tract symptoms (LUTS)    Chronic back pain    CKD (chronic kidney disease), stage III (HCC)    Diverticulosis    DJD (degenerative joint disease)    ED (erectile dysfunction)    GERD (gastroesophageal reflux disease)    Gout    per last attack over a year 2016 approx.   Hemorrhoids    Hyperlipidemia    Hypertension    Hypogonadism in male    OSA on CPAP    Rectal bleeding    Wears glasses     Past Surgical History:  Procedure Laterality Date   ANAL FISTULOTOMY  07-04-2003     [facility]   and Internal Hemorrhoid Banding   CATARACT EXTRACTION W/ INTRAOCULAR LENS  IMPLANT, BILATERAL  2013 approx.   COLONOSCOPY WITH PROPOFOL N/A 07/27/2013   Procedure: COLONOSCOPY WITH PROPOFOL;  Surgeon: Garlan Fair, MD;  Location: [facility];  Service: Endoscopy;  Laterality: N/A;   HEMORRHOID SURGERY N/A 10/02/2017   Procedure: HEMORRHOIDECTOMY, Anterior and Posterior;  Surgeon: Leighton Ruff, MD;  Location: [facility];  Service: General;  Laterality: N/A;   INCISION AND DRAINAGE PERIRECTAL ABSCESS  03-22-2003     [facility]   INGUINAL HERNIA REPAIR Right 08-14-2004    dr Ninfa [place]  03/ 2015         [facility])   NASAL SINUS SURGERY  07/29/2001   THUMB ARTHROTOMY/  REMOVAL MULTIPLE LOOSE BODIES/ SYNVECTOMY/  RESECTION MUCOID CYST Left 01-16-2010    dr sypher  [place] Bilateral    [place]    There were no vitals filed for this visit.   Subjective Assessment - 09/13/21 1558     Subjective "Feeling pretty good today"    Currently in Pain? No/denies                               [facility] Adult PT Treatment/Exercise - 09/13/21 0001       Neck Exercises: Machines for Strengthening   UBE (Upper Arm Bike) L1.7 x 3 min each      Neck Exercises: Seated   Other Seated Exercise neck retractions 2x10    Other Seated Exercise ER red 2x10, Seated OHP yellow ball 2x10      Lumbar Exercises: Machines for Strengthening   Other Lumbar Machine Exercise Rows & Lats 20lb 2x10      Lumbar Exercises: Standing   Row Strengthening;20 reps;Theraband;Both    Theraband Level (  Row) Level 2 (Red)      Lumbar Exercises: Seated   Other Seated Lumbar Exercises Horiz Abd red 2x10      Manual Therapy   Manual Therapy Soft tissue mobilization    Soft tissue mobilization upper traps into cervical para spinales                       PT Short Term Goals - 08/29/21 1325       PT SHORT TERM GOAL #1   Title He will be independent with initial HEP    Time 2    Period Weeks    Status New               PT Long Term Goals - 09/13/21 1635       PT LONG TERM GOAL #1   Title He will be independent with advanced HEP and safe with a gym    Status On-going      PT LONG TERM GOAL #2   Title He will report pain decreased 75% or more with   rotation to look for traffic    Status On-going      PT LONG TERM GOAL #3   Title Pt will increase BERG to at least 48/56 to place him at a low risk of falling.    Status On-going                   Plan - 09/13/21 1635     Clinical  Impression Statement Pt did well overall progressing with therapeutic exercises. Tactile cues needed to prevent trunk swaying with seated rows. Tactile cue to elbows needed with shoulder external rotation for UE positioning. No tissue density noted in the UT and posterior cervical para spinales. Cue needed to control the eccentric phase of standing rows and shoulder extensions    Examination-Activity Limitations Locomotion Level;Lift    Examination-Participation Restrictions Community Activity    Stability/Clinical Decision Making Evolving/Moderate complexity    Rehab Potential Good    PT Frequency 2x / week    PT Duration 8 weeks    PT Treatment/Interventions Manual techniques;Dry needling;Moist Heat;Ultrasound;Traction;Passive range of motion;Patient/family education;Electrical Stimulation;Cryotherapy;ADLs/Self Care Home Management;Neuromuscular re-education;Therapeutic exercise;Therapeutic activities;Iontophoresis 4mg /ml Dexamethasone;Gait training;Stair training;Functional mobility training;Balance training;Taping;Spinal Manipulations;Joint Manipulations    PT Next Visit Plan Assess and progress , core stability, flexibility, strengthening             Patient will benefit from skilled therapeutic intervention in order to improve the following deficits and impairments:  Decreased activity tolerance, Postural dysfunction, Increased muscle spasms, Pain, Difficulty walking, Impaired flexibility, Decreased mobility, Decreased strength, Decreased range of motion, Decreased balance  Visit Diagnosis: Chronic low back pain, unspecified back pain laterality, unspecified whether sciatica present  Cervicalgia  Muscle weakness (generalized)  Cramp and spasm     Problem List Patient Active Problem List   Diagnosis Date Noted   HEARTBURN 05/09/2008   ABDOMINAL BLOATING 05/09/2008   HYPERTENSION 05/05/2008   CONSTIPATION 05/05/2008   ARTHRITIS 05/05/2008   SLEEP APNEA 05/05/2008    HERNIORRHAPHY, HX OF 05/05/2008    Scot Jun, PTA 09/13/2021, 4:42 PM  [place]. [place], [place], [address] Phone: [phone]   Fax:  [phone]  Name: Marvin West MRN: [id number] Date of Birth: [birth year]

## 2021-09-17 ENCOUNTER — Ambulatory Visit: Payer: Medicare Other | Admitting: Physical Therapy

## 2021-09-17 ENCOUNTER — Other Ambulatory Visit: Payer: Self-pay

## 2021-09-17 ENCOUNTER — Encounter: Payer: Self-pay | Admitting: Physical Therapy

## 2021-09-17 DIAGNOSIS — M545 Low back pain, unspecified: Secondary | ICD-10-CM

## 2021-09-17 DIAGNOSIS — G8929 Other chronic pain: Secondary | ICD-10-CM | POA: Diagnosis not present

## 2021-09-17 DIAGNOSIS — R252 Cramp and spasm: Secondary | ICD-10-CM | POA: Diagnosis not present

## 2021-09-17 DIAGNOSIS — R2689 Other abnormalities of gait and mobility: Secondary | ICD-10-CM | POA: Diagnosis not present

## 2021-09-17 DIAGNOSIS — M6281 Muscle weakness (generalized): Secondary | ICD-10-CM | POA: Diagnosis not present

## 2021-09-17 DIAGNOSIS — M542 Cervicalgia: Secondary | ICD-10-CM | POA: Diagnosis not present

## 2021-09-17 DIAGNOSIS — R262 Difficulty in walking, not elsewhere classified: Secondary | ICD-10-CM

## 2021-09-17 NOTE — Therapy (Signed)
[place]. [place], [place], [address] Phone: [phone]   Fax:  [phone]  Physical Therapy Treatment  Patient Details  Name: Marvin West MRN: [id number] Date of Birth: 06/26/1934 Referring Provider (PT): Dr Lavone Orn   Encounter Date: 09/17/2021   PT End of Session - 09/17/21 1229     Visit Number 4    Number of Visits 16    Date for PT Re-Evaluation 10/19/21    PT Start Time 3007    PT Stop Time 1228    PT Time Calculation (min) 43 min    Activity Tolerance Patient tolerated treatment well    Behavior During Therapy [facility] for tasks assessed/performed             Past Medical History:  Diagnosis Date   Arthritis    Benign localized prostatic hyperplasia with lower urinary tract symptoms (LUTS)    Chronic back pain    CKD (chronic kidney disease), stage III (HCC)    Diverticulosis    DJD (degenerative joint disease)    ED (erectile dysfunction)    GERD (gastroesophageal reflux disease)    Gout    per last attack over a year 2016 approx.   Hemorrhoids    Hyperlipidemia    Hypertension    Hypogonadism in male    OSA on CPAP    Rectal bleeding    Wears glasses     Past Surgical History:  Procedure Laterality Date   ANAL FISTULOTOMY  07-04-2003     [facility]   and Internal Hemorrhoid Banding   CATARACT EXTRACTION W/ INTRAOCULAR LENS  IMPLANT, BILATERAL  2013 approx.   COLONOSCOPY WITH PROPOFOL N/A 07/27/2013   Procedure: COLONOSCOPY WITH PROPOFOL;  Surgeon: Garlan Fair, MD;  Location[facility];  Service: Endoscopy;  Laterality: N/A;   HEMORRHOID SURGERY N/A 10/02/2017   Procedure: HEMORRHOIDECTOMY, Anterior and Posterior;  Surgeon: Leighton Ruff, MD;  Location: [facility];  Service: General;  Laterality: N/A;   INCISION AND DRAINAGE PERIRECTAL ABSCESS  03-22-2003     [facility]   INGUINAL HERNIA REPAIR Right 08-14-2004    dr Ninfa Linden [place]  03/ 2015         [facility])   NASAL SINUS SURGERY  07/29/2001   THUMB ARTHROTOMY/  REMOVAL MULTIPLE LOOSE BODIES/ SYNVECTOMY/  RESECTION MUCOID CYST Left 01-16-2010    dr sypher  [place] Bilateral    [place]    There were no vitals filed for this visit.   Subjective Assessment - 09/17/21 1146     Subjective "im ok"    Pertinent History OA    Currently in Pain? No/denies                               [facility] Adult PT Treatment/Exercise - 09/17/21 0001       Neck Exercises: Machines for Strengthening   UBE (Upper Arm Bike) L1.7 x 3 min each      Neck Exercises: Standing   Other Standing Exercises Shoulder AAROM  Flex, Ext, IR up back 1lb bar x10      Neck Exercises: Seated   Other Seated Exercise ER red 2x10,  OHP 3lb ball 2x10      Lumbar Exercises: Machines for Strengthening   Other Lumbar Machine Exercise Rows & Lats 25lb  2x10      Lumbar Exercises: Standing   Other Standing Lumbar Exercises Alt 6 in box taps x10, Resisted gait forward and backwards 30lb x 3 each    Other Standing Lumbar Exercises Horiz Abd red ex10                       PT Short Term Goals - 08/29/21 1325       PT SHORT TERM GOAL #1   Title He will be independent with initial HEP    Time 2    Period Weeks    Status New               PT Long Term Goals - 09/17/21 1219       PT LONG TERM GOAL #2   Title He will report pain decreased 75% or more with   rotation to look for traffic    Status Partially Met                   Plan - 09/17/21 1229     Clinical Impression Statement Pt reports improvement overall with less pain. Pt able to progress with balance interventions today. Some instability noted what resisted gait. With resisted gait pt with  a wide base of support. SBA needed with alt box taps. increase resistance tolerated with seated rows and lats. Tactile cue to elbows needed for  correct postioning with external rotation    Personal Factors and Comorbidities Age    Examination-Activity Limitations Locomotion Level;Lift    Examination-Participation Restrictions Community Activity    Stability/Clinical Decision Making Evolving/Moderate complexity    Rehab Potential Good    PT Frequency 2x / week    PT Duration 8 weeks    PT Treatment/Interventions Manual techniques;Dry needling;Moist Heat;Ultrasound;Traction;Passive range of motion;Patient/family education;Electrical Stimulation;Cryotherapy;ADLs/Self Care Home Management;Neuromuscular re-education;Therapeutic exercise;Therapeutic activities;Iontophoresis 66m/ml Dexamethasone;Gait training;Stair training;Functional mobility training;Balance training;Taping;Spinal Manipulations;Joint Manipulations    PT Next Visit Plan Assess and progress , core stability, flexibility, strengthening             Patient will benefit from skilled therapeutic intervention in order to improve the following deficits and impairments:     Visit Diagnosis: Cervicalgia  Cramp and spasm  Muscle weakness (generalized)  Balance disorder  Difficulty in walking, not elsewhere classified  Chronic low back pain, unspecified back pain laterality, unspecified whether sciatica present     Problem List Patient Active Problem List   Diagnosis Date Noted   HEARTBURN 05/09/2008   ABDOMINAL BLOATING 05/09/2008   HYPERTENSION 05/05/2008   CONSTIPATION 05/05/2008   ARTHRITIS 05/05/2008   SLEEP APNEA 05/05/2008   HERNIORRHAPHY, HX OF 05/05/2008    RScot Jun PTA 09/17/2021, 12:34 PM  [place] [address]Phone: [phone]  Fax:  [phone] Name: Marvin CAULMRN: [id number] of Birth: [birth year]

## 2021-09-19 ENCOUNTER — Ambulatory Visit: Payer: Medicare Other | Admitting: Rehabilitative and Restorative Service Providers"

## 2021-09-20 ENCOUNTER — Other Ambulatory Visit: Payer: Self-pay

## 2021-09-20 ENCOUNTER — Ambulatory Visit (INDEPENDENT_AMBULATORY_CARE_PROVIDER_SITE_OTHER): Payer: Medicare Other | Admitting: Physical Therapy

## 2021-09-20 DIAGNOSIS — G8929 Other chronic pain: Secondary | ICD-10-CM | POA: Diagnosis not present

## 2021-09-20 DIAGNOSIS — R252 Cramp and spasm: Secondary | ICD-10-CM | POA: Diagnosis not present

## 2021-09-20 DIAGNOSIS — M545 Low back pain, unspecified: Secondary | ICD-10-CM

## 2021-09-20 DIAGNOSIS — R262 Difficulty in walking, not elsewhere classified: Secondary | ICD-10-CM | POA: Diagnosis not present

## 2021-09-20 DIAGNOSIS — R2689 Other abnormalities of gait and mobility: Secondary | ICD-10-CM | POA: Diagnosis not present

## 2021-09-20 DIAGNOSIS — M6281 Muscle weakness (generalized): Secondary | ICD-10-CM | POA: Diagnosis not present

## 2021-09-20 DIAGNOSIS — M542 Cervicalgia: Secondary | ICD-10-CM

## 2021-09-20 NOTE — Patient Instructions (Signed)
Access Code: [email or web address] URL: [email or web address] Date: 09/20/2021 Prepared by: [place]  Exercises Standing March with Counter Support - 2 x daily - 7 x weekly - 2 sets - 10 reps Standing Hip Flexion with Counter Support - 2 x daily - 7 x weekly - 2 sets - 10 reps Standing Hip Abduction with Counter Support - 2 x daily - 7 x weekly - 2 sets - 10 reps Standing Hip Extension with Counter Support - 2 x daily - 7 x weekly - 2 sets - 10 reps Seated Cervical Retraction - 2 x daily - 7 x weekly - 2 sets - 10 reps Standing Single Leg Stance with Counter Support - 1 x daily - 7 x weekly - 1 sets - 3 reps - 10 seconds hold

## 2021-09-20 NOTE — Therapy (Signed)
[place], [place], [address] Phone: [phone]   Fax:  [phone]  Physical Therapy Treatment  Patient Details  Name: Marvin West MRN: [id number] Date of Birth: 04-10-1934 Referring Provider (PT): Dr Lavone Orn   Encounter Date: 09/20/2021   PT End of Session - 09/20/21 1712     Visit Number 5    Number of Visits 16    Date for PT Re-Evaluation 10/19/21    PT Start Time 1707   pt arrived late   PT Stop Time 1755   MHP last 10 min   PT Time Calculation (min) 48 min    Activity Tolerance Patient tolerated treatment well    Behavior During Therapy [facility] for tasks assessed/performed             Past Medical History:  Diagnosis Date   Arthritis    Benign localized prostatic hyperplasia with lower urinary tract symptoms (LUTS)    Chronic back pain    CKD (chronic kidney disease), stage III (HCC)    Diverticulosis    DJD (degenerative joint disease)    ED (erectile dysfunction)    GERD (gastroesophageal reflux disease)    Gout    per last attack over a year 2016 approx.   Hemorrhoids    Hyperlipidemia    Hypertension    Hypogonadism in male    OSA on CPAP    Rectal bleeding    Wears glasses     Past Surgical History:  Procedure Laterality Date   ANAL FISTULOTOMY  07-04-2003     [facility]   and Internal Hemorrhoid Banding   CATARACT EXTRACTION W/ INTRAOCULAR LENS  IMPLANT, BILATERAL  2013 approx.   COLONOSCOPY WITH PROPOFOL N/A 07/27/2013   Procedure: COLONOSCOPY WITH PROPOFOL;  Surgeon: Garlan Fair, MD;  Location: [facility];  Service: Endoscopy;  Laterality: N/A;   HEMORRHOID SURGERY N/A 10/02/2017   Procedure: HEMORRHOIDECTOMY, Anterior and Posterior;  Surgeon: Leighton Ruff, MD;  Location: [facility];  Service: General;  Laterality: N/A;   INCISION AND DRAINAGE PERIRECTAL ABSCESS  03-22-2003     [facility]   INGUINAL HERNIA REPAIR Right 08-14-2004    dr Ninfa Linden  [place]  03/ 2015        [facility])   NASAL SINUS SURGERY  07/29/2001   THUMB ARTHROTOMY/  REMOVAL MULTIPLE LOOSE BODIES/ SYNVECTOMY/  RESECTION MUCOID CYST Left 01-16-2010    dr sypher  [place] Bilateral    [place]    There were no vitals filed for this visit.   Subjective Assessment - 09/20/21 1710     Subjective Pt reports  some improvement  in back and neck, but not sure it's from therapy.  He does his exercises 2-3x/week.    Patient Stated Goals To feel better and have less pain and improve balance.    Currently in Pain? Yes    Pain Score 5     Pain Location Back    Pain Orientation Lower    Pain Descriptors / Indicators Aching    Aggravating Factors  lifting stuff    Pain Relieving Factors not lifting.                [facility] PT Assessment - 09/20/21 0001       Assessment   Medical Diagnosis M15.9 (ICD-10-CM) - Polyosteoarthritis, unspecified    Referring Provider (PT)  Dr Lavone Orn    Hand Dominance Right    Prior Therapy for the neck       Berg Balance Test   Sit to Stand Able to stand  independently using hands    Standing Unsupported Able to stand 30 seconds unsupported    Sitting with Back Unsupported but Feet Supported on Floor or Stool Able to sit safely and securely 2 minutes    Stand to Sit Sits safely with minimal use of hands    Transfers Able to transfer safely, minor use of hands    Standing Unsupported with Eyes Closed Able to stand 10 seconds with supervision    Standing Unsupported with Feet Together Able to place feet together independently and stand 1 minute safely    From Standing, Reach Forward with Outstretched Arm Can reach confidently >25 cm (10")    From Standing Position, Pick up Object from Floor Able to pick up shoe, needs supervision    From Standing Position, Turn to Look Behind Over each Shoulder Looks behind one side only/other side shows  less weight shift    Turn 360 Degrees Able to turn 360 degrees safely but slowly   6/7 sec   Standing Unsupported, Alternately Place Feet on Step/Stool Able to stand independently and complete 8 steps >20 seconds    Standing Unsupported, One Foot in Front Able to take small step independently and hold 30 seconds    Standing on One Leg Tries to lift leg/unable to hold 3 seconds but remains standing independently    Total Score 42               [facility] Adult PT Treatment/Exercise - 09/20/21 0001       Lumbar Exercises: Stretches   Passive Hamstring Stretch Right;Left;1 rep;20 seconds    Single Knee to Chest Stretch Right;Left;2 reps;10 seconds    Lower Trunk Rotation 5 reps;10 seconds   arms in T.     Moist Heat Therapy   Number Minutes Moist Heat 10 Minutes    Moist Heat Location Lumbar Spine             Balance Exercises - 09/20/21 0001       Balance Exercises: Standing   Standing Eyes Opened Head turns;Narrow base of support (BOS);Foam/compliant surface    Standing Eyes Closed Narrow base of support (BOS)    Tandem Stance Eyes open;Intermittent upper extremity support;2 reps;15 secs    SLS Eyes open;Upper extremity support 1    Tandem Gait Forward;Retro;Intermittent upper extremity support;2 reps    Partial Tandem Stance Eyes open;Intermittent upper extremity support;2 reps    Sidestepping 2 reps    Step Over Hurdles / Cones Lateral and ant/post stepping over big noodle x 5 reps, 2 sets    Marching Foam/compliant surface                PT Education - 09/20/21 1756     Education Details Added SLS with light touch on counter.  issued sample of Rocksauce (analgesic cream for pain)    Person(s) Educated Patient    Methods Explanation;Handout;Verbal cues;Demonstration;Tactile cues    Comprehension Verbalized understanding;Returned demonstration              PT Short Term Goals - 08/29/21 1325       PT SHORT TERM GOAL #1   Title He will be independent  with initial HEP    Time 2    Period Weeks    Status Ongoing  PT Long Term Goals - 09/20/21 1749       PT LONG TERM GOAL #1   Title He will be independent with advanced HEP and safe with a gym    Status On-going      PT LONG TERM GOAL #2   Title He will report pain decreased 75% or more with rotation to look for traffic    Status Partially Met      PT LONG TERM GOAL #3   Title Pt will increase BERG to at least 48/56 to place him at a low risk of falling.    Baseline 42/56: 09/20/21    Status On-going      PT LONG TERM GOAL #4   Title Pt will increase FOTO to 45%.    Status On-going      PT LONG TERM GOAL #5   Title Pt will increase BLE to at least 4+/5 to allow him to return to the gym or golf.    Time 8    Period Weeks    Status On-going                   Plan - 09/20/21 1751     Clinical Impression Statement Marvin West much improved since eval.  He continues with difficulty with single leg stance and narrow base of support standing exercises.  He reported no increase in back pain with exercises. Progressing towards LTGs.    Personal Factors and Comorbidities Age    Examination-Activity Limitations Locomotion Level;Lift    Examination-Participation Restrictions Community Activity    Stability/Clinical Decision Making Evolving/Moderate complexity    Rehab Potential Good    PT Frequency 2x / week    PT Duration 8 weeks    PT Treatment/Interventions Manual techniques;Dry needling;Moist Heat;Ultrasound;Traction;Passive range of motion;Patient/family education;Electrical Stimulation;Cryotherapy;ADLs/Self Care Home Management;Neuromuscular re-education;Therapeutic exercise;Therapeutic activities;Iontophoresis 23m/ml Dexamethasone;Gait training;Stair training;Functional mobility training;Balance training;Taping;Spinal Manipulations;Joint Manipulations    PT Next Visit Plan Progress balance exercise, core stability, flexibility, strengthening of LE.    PT  Home Exercise Plan Access Code: [email or web address]            Patient will benefit from skilled therapeutic intervention in order to improve the following deficits and impairments:  Decreased activity tolerance, Postural dysfunction, Increased muscle spasms, Pain, Difficulty walking, Impaired flexibility, Decreased mobility, Decreased strength, Decreased range of motion, Decreased balance  Visit Diagnosis: Cramp and spasm  Balance disorder  Muscle weakness (generalized)  Chronic low back pain, unspecified back pain laterality, unspecified whether sciatica present  Difficulty in walking, not elsewhere classified  Cervicalgia     Problem List Patient Active Problem List   Diagnosis Date Noted   HEARTBURN 05/09/2008   ABDOMINAL BLOATING 05/09/2008   HYPERTENSION 05/05/2008   CONSTIPATION 05/05/2008   ARTHRITIS 05/05/2008   SLEEP APNEA 05/05/2008   HERNIORRHAPHY, HX OF 05/05/2008   JKerin West PTA 09/20/21 6:04 PM  [facility] [id number] [address]Phone: [phone]  Fax:  [phone] Name: Marvin TIMSONMRN: [id number] of Birth: [birth year]

## 2021-09-24 ENCOUNTER — Ambulatory Visit: Payer: Medicare Other | Admitting: Rehabilitative and Restorative Service Providers"

## 2021-09-26 ENCOUNTER — Ambulatory Visit: Payer: Medicare Other | Admitting: Physical Therapy

## 2021-09-27 ENCOUNTER — Other Ambulatory Visit: Payer: Self-pay

## 2021-09-27 ENCOUNTER — Encounter: Payer: Self-pay | Admitting: Physical Therapy

## 2021-09-27 ENCOUNTER — Ambulatory Visit (INDEPENDENT_AMBULATORY_CARE_PROVIDER_SITE_OTHER): Payer: Medicare Other | Admitting: Physical Therapy

## 2021-09-27 DIAGNOSIS — M545 Low back pain, unspecified: Secondary | ICD-10-CM | POA: Diagnosis not present

## 2021-09-27 DIAGNOSIS — R2689 Other abnormalities of gait and mobility: Secondary | ICD-10-CM | POA: Diagnosis not present

## 2021-09-27 DIAGNOSIS — G8929 Other chronic pain: Secondary | ICD-10-CM

## 2021-09-27 DIAGNOSIS — R252 Cramp and spasm: Secondary | ICD-10-CM | POA: Diagnosis not present

## 2021-09-27 DIAGNOSIS — M6281 Muscle weakness (generalized): Secondary | ICD-10-CM | POA: Diagnosis not present

## 2021-09-27 NOTE — Patient Instructions (Signed)
Access Code: [email or web address] URL: [email or web address] Date: 09/27/2021 Prepared by: [place]  Exercises Standing March with Counter Support - 2 x daily - 7 x weekly - 2 sets - 10 reps Side Stepping with Counter Support - 1 x daily - 7 x weekly - 1 sets - 10 reps Standing Hip Extension with Counter Support - 2 x daily - 7 x weekly - 2 sets - 10 reps Standing Single Leg Stance with Counter Support - 1 x daily - 7 x weekly - 1 sets - 3 reps - 10 seconds hold Standing Row with Anchored Resistance - 1 x daily - 7 x weekly - 2 sets - 10 reps Doorway Pec Stretch at 90 Degrees Abduction - 1 x daily - 7 x weekly - 1 sets - 2 reps - 15 seconds hold Seated Hamstring Stretch - 1 x daily - 7 x weekly - 1 sets - 2-3 reps - 15 seconds hold Seated Figure 4 Piriformis Stretch - 1 x daily - 7 x weekly - 1 sets - 2-3 reps - 15 seconds hold

## 2021-09-27 NOTE — Therapy (Addendum)
[place], [place], [address] Phone: [phone]   Fax:  [phone]  Physical Therapy Treatment  Patient Details  Name: ERCELL West MRN: [id number] Date of Birth: December 11, 1934 Referring Provider (PT): Dr Lavone Orn   Encounter Date: 09/27/2021   PT End of Session - 09/27/21 1545     Visit Number 6    Number of Visits 16    Date for PT Re-Evaluation 10/19/21    PT Start Time 1538   pt arrived late   PT Stop Time 1614    PT Time Calculation (min) 36 min    Activity Tolerance Patient tolerated treatment well    Behavior During Therapy [facility] for tasks assessed/performed             Past Medical History:  Diagnosis Date   Arthritis    Benign localized prostatic hyperplasia with lower urinary tract symptoms (LUTS)    Chronic back pain    CKD (chronic kidney disease), stage III (HCC)    Diverticulosis    DJD (degenerative joint disease)    ED (erectile dysfunction)    GERD (gastroesophageal reflux disease)    Gout    per last attack over a year 2016 approx.   Hemorrhoids    Hyperlipidemia    Hypertension    Hypogonadism in male    OSA on CPAP    Rectal bleeding    Wears glasses     Past Surgical History:  Procedure Laterality Date   ANAL FISTULOTOMY  07-04-2003     [facility]   and Internal Hemorrhoid Banding   CATARACT EXTRACTION W/ INTRAOCULAR LENS  IMPLANT, BILATERAL  2013 approx.   COLONOSCOPY WITH PROPOFOL N/A 07/27/2013   Procedure: COLONOSCOPY WITH PROPOFOL;  Surgeon: Garlan Fair, MD;  Location[facility];  Service: Endoscopy;  Laterality: N/A;   HEMORRHOID SURGERY N/A 10/02/2017   Procedure: HEMORRHOIDECTOMY, Anterior and Posterior;  Surgeon: Leighton Ruff, MD;  Location: [facility];  Service: General;  Laterality: N/A;   INCISION AND DRAINAGE PERIRECTAL ABSCESS  03-22-2003     [facility]   INGUINAL HERNIA REPAIR Right 08-14-2004    dr Ninfa Linden [place]  03/ 2015        [facility])   NASAL SINUS SURGERY  07/29/2001   THUMB ARTHROTOMY/  REMOVAL MULTIPLE LOOSE BODIES/ SYNVECTOMY/  RESECTION MUCOID CYST Left 01-16-2010    dr sypher  [place] Bilateral    [place]    There were no vitals filed for this visit.   Subjective Assessment - 09/27/21 1541     Subjective Pt reports the pain in neck and back "comes and goes".  He didn't try the analgesic cream from last session.  He lost his exercise papers so he has been doing things from memory.  He believes that the medication he had to take after dental surgery is what caused his balance issues.    Currently in Pain? Yes    Pain Score --   just a little, can't give number   Pain Location Back    Pain Orientation Lower                [facility] PT Assessment - 09/27/21 0001       Assessment   Medical Diagnosis M15.9 (ICD-10-CM) - Polyosteoarthritis, unspecified    Referring Provider (PT) Dr Lavone Orn    Hand  Dominance Right    Prior Therapy for the neck               [facility] Adult PT Treatment/Exercise - 09/27/21 0001       Neck Exercises: Standing   Other Standing Exercises bilat shoulder Marvin with red band x 10      Lumbar Exercises: Stretches   Passive Hamstring Stretch Left;Right;2 reps;20 seconds   seated with hip hinge, straight back   Piriformis Stretch Right;Left;2 reps;20 seconds   seated, fig 4   Other Lumbar Stretch Exercise midlevel doorway stretch x 15 sec x 2.      Lumbar Exercises: Standing   Row Strengthening;Both;10 reps    Theraband Level (Row) Level 2 (Red);Level 3 (Green)   2 sets   Shoulder Extension Strengthening;Both;10 reps    Theraband Level (Shoulder Extension) Level 2 (Red);Level 3 (Green)    Other Standing Lumbar Exercises Hip ext x 10 each leg; hip abdct x 10 each leg    Other Standing Lumbar Exercises high knee marching x 12 at counter      Manual Therapy    Manual therapy comments application of Rock sauce to pt's low back for pain relief.              PT Education - 09/27/21 1645     Education Details HEP.  issued red and green bands    Person(s) Educated Patient    Methods Explanation;Demonstration;Verbal cues;Handout    Comprehension Returned demonstration;Verbalized understanding;Verbal cues required              PT Short Term Goals - 09/27/21 1620       PT SHORT TERM GOAL #1   Title He will be independent with initial HEP    Baseline re-issued HEP (previous copy lost): 09/27/21    Time 2    Period Weeks    Status On-going               PT Long Term Goals - 09/20/21 1749       PT LONG TERM GOAL #1   Title He will be independent with advanced HEP and safe with a gym    Status On-going      PT LONG TERM GOAL #2   Title He will report pain decreased 75% or more with rotation to look for traffic    Status Partially Met      PT LONG TERM GOAL #3   Title Pt will increase BERG to at least 48/56 to place him at a low risk of falling.    Baseline 42/56: 09/20/21    Status On-going      PT LONG TERM GOAL #4   Title Pt will increase FOTO to 45%.    Status On-going      PT LONG TERM GOAL #5   Title Pt will increase BLE to at least 4+/5 to allow him to return to the gym or golf.    Time 8    Period Weeks    Status On-going                   Plan - 09/27/21 1618     Clinical Impression Statement Pt tolerated standing exercises without increase in LBP. He required minor cues for form with exercises.  Updated his HEP for strengthening, stretches, and one exercise for balance.  Trialed application of Rock sauce analgesic on back for back discomfort.  Pt making progress towards LTGs.    Personal Factors  and Comorbidities Age    Examination-Activity Limitations Locomotion Level;Lift    Examination-Participation Restrictions Community Activity    Stability/Clinical Decision Making Evolving/Moderate  complexity    Rehab Potential Good    PT Frequency 2x / week    PT Duration 8 weeks    PT Treatment/Interventions Manual techniques;Dry needling;Moist Heat;Ultrasound;Traction;Passive range of motion;Patient/family education;Electrical Stimulation;Cryotherapy;ADLs/Self Care Home Management;Neuromuscular re-education;Therapeutic exercise;Therapeutic activities;Iontophoresis 41m/ml Dexamethasone;Gait training;Stair training;Functional mobility training;Balance training;Taping;Spinal Manipulations;Joint Manipulations    PT Next Visit Plan MMT LE strength.  Progress balance exercise, core stability, flexibility, strengthening of LE.    PT Home Exercise Plan Access Code: [email or web address]            Patient will benefit from skilled therapeutic intervention in order to improve the following deficits and impairments:  Decreased activity tolerance, Postural dysfunction, Increased muscle spasms, Pain, Difficulty walking, Impaired flexibility, Decreased mobility, Decreased strength, Decreased range of motion, Decreased balance  Visit Diagnosis: Muscle weakness (generalized)  Cramp and spasm  Chronic low back pain, unspecified back pain laterality, unspecified whether sciatica present  Balance disorder     Problem List Patient Active Problem List   Diagnosis Date Noted   HEARTBURN 05/09/2008   ABDOMINAL BLOATING 05/09/2008   HYPERTENSION 05/05/2008   CONSTIPATION 05/05/2008   ARTHRITIS 05/05/2008   SLEEP APNEA 05/05/2008   HERNIORRHAPHY, HX OF 05/05/2008   JKerin Perna PTA 09/27/21 4:46 PM  [id number] [address]Phone: [phone]  Fax:  [phone] Name: Marvin TYLERMRN: [id number] of Birth: 1Jul 17, 1935

## 2021-10-02 DIAGNOSIS — E291 Testicular hypofunction: Secondary | ICD-10-CM | POA: Diagnosis not present

## 2021-10-02 DIAGNOSIS — Z23 Encounter for immunization: Secondary | ICD-10-CM | POA: Diagnosis not present

## 2021-10-04 ENCOUNTER — Other Ambulatory Visit: Payer: Self-pay

## 2021-10-04 ENCOUNTER — Ambulatory Visit (INDEPENDENT_AMBULATORY_CARE_PROVIDER_SITE_OTHER): Payer: Medicare Other | Admitting: Physical Therapy

## 2021-10-04 DIAGNOSIS — R252 Cramp and spasm: Secondary | ICD-10-CM

## 2021-10-04 DIAGNOSIS — G8929 Other chronic pain: Secondary | ICD-10-CM

## 2021-10-04 DIAGNOSIS — R2689 Other abnormalities of gait and mobility: Secondary | ICD-10-CM

## 2021-10-04 DIAGNOSIS — R262 Difficulty in walking, not elsewhere classified: Secondary | ICD-10-CM

## 2021-10-04 DIAGNOSIS — M542 Cervicalgia: Secondary | ICD-10-CM

## 2021-10-04 DIAGNOSIS — M6281 Muscle weakness (generalized): Secondary | ICD-10-CM

## 2021-10-04 DIAGNOSIS — M545 Low back pain, unspecified: Secondary | ICD-10-CM

## 2021-10-04 NOTE — Therapy (Signed)
[place], [place], [address] Phone: [phone]   Fax:  [phone]  Physical Therapy Treatment  Patient Details  Name: Marvin West MRN: [id number] Date of Birth: May 19, 1934 Referring Provider (PT): Dr Lavone Orn   Encounter Date: 10/04/2021   PT End of Session - 10/04/21 0929     Visit Number 7    Number of Visits 16    Date for PT Re-Evaluation 10/19/21    PT Start Time 0929    PT Stop Time 1016    PT Time Calculation (min) 47 min    Activity Tolerance Patient tolerated treatment well    Behavior During Therapy [facility] for tasks assessed/performed             Past Medical History:  Diagnosis Date   Arthritis    Benign localized prostatic hyperplasia with lower urinary tract symptoms (LUTS)    Chronic back pain    CKD (chronic kidney disease), stage III (HCC)    Diverticulosis    DJD (degenerative joint disease)    ED (erectile dysfunction)    GERD (gastroesophageal reflux disease)    Gout    per last attack over a year 2016 approx.   Hemorrhoids    Hyperlipidemia    Hypertension    Hypogonadism in male    OSA on CPAP    Rectal bleeding    Wears glasses     Past Surgical History:  Procedure Laterality Date   ANAL FISTULOTOMY  07-04-2003     [facility]   and Internal Hemorrhoid Banding   CATARACT EXTRACTION W/ INTRAOCULAR LENS  IMPLANT, BILATERAL  2013 approx.   COLONOSCOPY WITH PROPOFOL N/A 07/27/2013   Procedure: COLONOSCOPY WITH PROPOFOL;  Surgeon: Garlan Fair, MD;  Location: [facility];  Service: Endoscopy;  Laterality: N/A;   HEMORRHOID SURGERY N/A 10/02/2017   Procedure: HEMORRHOIDECTOMY, Anterior and Posterior;  Surgeon: Leighton Ruff, MD;  Location: [facility];  Service: General;  Laterality: N/A;   INCISION AND DRAINAGE PERIRECTAL ABSCESS  03-22-2003     [facility]   INGUINAL HERNIA REPAIR Right 08-14-2004    dr Ninfa Linden [place]  03/ 2015         [facility])   NASAL SINUS SURGERY  07/29/2001   THUMB ARTHROTOMY/  REMOVAL MULTIPLE LOOSE BODIES/ SYNVECTOMY/  RESECTION MUCOID CYST Left 01-16-2010    dr sypher  [place] Bilateral    [place]    There were no vitals filed for this visit.   Subjective Assessment - 10/04/21 0931     Subjective Pt reports dizzy spells have improved after not taking the medication. Pt states that he did not think the cream helped too much. Pt reports exercises would be good "if I were more regular." He states he did the whole sheet at least twice.    Pertinent History OA    Limitations Lifting;House hold activities;Walking    Patient Stated Goals To feel better and have less pain and improve balance.    Currently in Pain? Yes    Pain Location Back    Pain Orientation Lower    Pain Type Chronic pain                               [facility] Adult PT Treatment/Exercise - 10/04/21 0001  Neck Exercises: Seated   Other Seated Exercise neck retractions 2x10    Other Seated Exercise Cervical rotation MET with towel with 5 sec holds x5      Lumbar Exercises: Stretches   Passive Hamstring Stretch Left;Right;2 reps;30 seconds    Double Knee to Chest Stretch 2 reps;30 seconds    Piriformis Stretch Right;Left;2 reps;30 seconds    Other Lumbar Stretch Exercise open close book x10 bilat      Lumbar Exercises: Standing   Other Standing Lumbar Exercises Hip ext x 10 each leg; hip abdct x 10 each leg    Other Standing Lumbar Exercises high knee marching x 12 at counter      Lumbar Exercises: Supine   Bridge Compliant   2x10     Manual Therapy   Manual Therapy Joint mobilization    Joint Mobilization cervical PA and lateral transverse mobs grade II                     PT Education - 10/04/21 1210     Education Details HEP updates. Encouraged pt to be more consistent with his stretching and  exercises to maintain his mobility.    Person(s) Educated Patient    Methods Explanation;Demonstration;Tactile cues;Verbal cues;Handout    Comprehension Verbalized understanding;Returned demonstration;Verbal cues required;Tactile cues required              PT Short Term Goals - 09/27/21 1620       PT SHORT TERM GOAL #1   Title He will be independent with initial HEP    Baseline re-issued HEP (previous copy lost): 09/27/21    Time 2    Period Weeks    Status On-going               PT Long Term Goals - 09/20/21 1749       PT LONG TERM GOAL #1   Title He will be independent with advanced HEP and safe with a gym    Status On-going      PT LONG TERM GOAL #2   Title He will report pain decreased 75% or more with rotation to look for traffic    Status Partially Met      PT LONG TERM GOAL #3   Title Pt will increase BERG to at least 48/56 to place him at a low risk of falling.    Baseline 42/56: 09/20/21    Status On-going      PT LONG TERM GOAL #4   Title Pt will increase FOTO to 45%.    Status On-going      PT LONG TERM GOAL #5   Title Pt will increase BLE to at least 4+/5 to allow him to return to the gym or golf.    Time 8    Period Weeks    Status On-going                   Plan - 10/04/21 0953     Clinical Impression Statement Treatment session focused on improving cervical, thoracic, lumbar and hip flexibility. Continued to progress strengthening and balance exercises. Discussed with pt importance of maintaining his joint mobility and flexibility to decrease stiffness and pain.    Personal Factors and Comorbidities Age    Examination-Activity Limitations Locomotion Level;Lift    Examination-Participation Restrictions Community Activity    Stability/Clinical Decision Making Evolving/Moderate complexity    Rehab Potential Good    PT Frequency 2x / week    PT  Duration 8 weeks    PT Treatment/Interventions Manual techniques;Dry needling;Moist  Heat;Ultrasound;Traction;Passive range of motion;Patient/family education;Electrical Stimulation;Cryotherapy;ADLs/Self Care Home Management;Neuromuscular re-education;Therapeutic exercise;Therapeutic activities;Iontophoresis 59m/ml Dexamethasone;Gait training;Stair training;Functional mobility training;Balance training;Taping;Spinal Manipulations;Joint Manipulations    PT Next Visit Plan MMT LE strength.  Progress balance exercise, core stability, flexibility, strengthening of LE.    PT Home Exercise Plan Access Code: [email or web address]            Patient will benefit from skilled therapeutic intervention in order to improve the following deficits and impairments:  Decreased activity tolerance, Postural dysfunction, Increased muscle spasms, Pain, Difficulty walking, Impaired flexibility, Decreased mobility, Decreased strength, Decreased range of motion, Decreased balance  Visit Diagnosis: Muscle weakness (generalized)  Cramp and spasm  Chronic low back pain, unspecified back pain laterality, unspecified whether sciatica present  Balance disorder  Difficulty in walking, not elsewhere classified  Cervicalgia     Problem List Patient Active Problem List   Diagnosis Date Noted   HEARTBURN 05/09/2008   ABDOMINAL BLOATING 05/09/2008   HYPERTENSION 05/05/2008   CONSTIPATION 05/05/2008   ARTHRITIS 05/05/2008   SLEEP APNEA 05/05/2008   HERNIORRHAPHY, HX OF 05/05/2008    Merrianne Mccumbers April Ma L Geral Tuch, PT, DPT 10/04/2021, 12:26 PM  [facility] [address] [address]Phone: [phone]  Fax:  [phone] Name: Marvin CARLYONMRN: [id number] of Birth: [birth year]

## 2021-10-11 ENCOUNTER — Ambulatory Visit (INDEPENDENT_AMBULATORY_CARE_PROVIDER_SITE_OTHER): Payer: Medicare Other | Admitting: Physical Therapy

## 2021-10-11 ENCOUNTER — Other Ambulatory Visit: Payer: Self-pay

## 2021-10-11 DIAGNOSIS — G8929 Other chronic pain: Secondary | ICD-10-CM

## 2021-10-11 DIAGNOSIS — M542 Cervicalgia: Secondary | ICD-10-CM

## 2021-10-11 DIAGNOSIS — M545 Low back pain, unspecified: Secondary | ICD-10-CM

## 2021-10-11 DIAGNOSIS — R262 Difficulty in walking, not elsewhere classified: Secondary | ICD-10-CM

## 2021-10-11 DIAGNOSIS — M6281 Muscle weakness (generalized): Secondary | ICD-10-CM

## 2021-10-11 DIAGNOSIS — R252 Cramp and spasm: Secondary | ICD-10-CM

## 2021-10-11 DIAGNOSIS — R2689 Other abnormalities of gait and mobility: Secondary | ICD-10-CM

## 2021-10-11 NOTE — Therapy (Signed)
[facility] [address] [place], [place], [address] Phone: [phone]   Fax:  [phone]  Physical Therapy Treatment  Patient Details  Name: Marvin West MRN: [id number] Date of Birth: 04/22/1934 Referring Provider (PT): Dr John Griffin   Encounter Date: 10/11/2021   PT End of Session - 10/11/21 1533     Visit Number 8    Number of Visits 16    Date for PT Re-Evaluation 10/19/21    Authorization Type MCR    PT Start Time 1445    PT Stop Time 1530    PT Time Calculation (min) 45 min    Activity Tolerance Patient tolerated treatment well    Behavior During Therapy WFL for tasks assessed/performed             Past Medical History:  Diagnosis Date   Arthritis    Benign localized prostatic hyperplasia with lower urinary tract symptoms (LUTS)    Chronic back pain    CKD (chronic kidney disease), stage III (HCC)    Diverticulosis    DJD (degenerative joint disease)    ED (erectile dysfunction)    GERD (gastroesophageal reflux disease)    Gout    per last attack over a year 2016 approx.   Hemorrhoids    Hyperlipidemia    Hypertension    Hypogonadism in male    OSA on CPAP    Rectal bleeding    Wears glasses     Past Surgical History:  Procedure Laterality Date   ANAL FISTULOTOMY  07-04-2003     [facility]   and Internal Hemorrhoid Banding   CATARACT EXTRACTION W/ INTRAOCULAR LENS  IMPLANT, BILATERAL  2013 approx.   COLONOSCOPY WITH PROPOFOL N/A 07/27/2013   Procedure: COLONOSCOPY WITH PROPOFOL;  Surgeon: Martin K Johnson, MD;  Location: [facility];  Service: Endoscopy;  Laterality: N/A;   HEMORRHOID SURGERY N/A 10/02/2017   Procedure: HEMORRHOIDECTOMY, Anterior and Posterior;  Surgeon: Thomas, Alicia, MD;  Location: [facility];  Service: General;  Laterality: N/A;   INCISION AND DRAINAGE PERIRECTAL ABSCESS  03-22-2003     [facility]   INGUINAL HERNIA REPAIR Right 08-14-2004    dr blackman [facility]    LUMBAR DISECTOMY  03/ 2015        ([place])   NASAL SINUS SURGERY  07/29/2001   THUMB ARTHROTOMY/  REMOVAL MULTIPLE LOOSE BODIES/ SYNVECTOMY/  RESECTION MUCOID CYST Left 01-16-2010    dr sypher  [facility]   TOE SURGERY Bilateral    TRANSURETHRAL RESECTION OF PROSTATE  1983   UVULOPALATOPHARYNGOPLASTY  1993    There were no vitals filed for this visit.   Subjective Assessment - 10/11/21 1451     Subjective Pt states he's been doing the exercises "yes and no." Pt has been going to the gym walking and doing weights. Used  heating pad on back. Pt requesting TPDN.    Pertinent History OA    Limitations Lifting;House hold activities;Walking    Patient Stated Goals To feel better and have less pain and improve balance.    Currently in Pain? Yes    Pain Score 3     Pain Location Back    Pain Orientation Mid;Lower    Pain Descriptors / Indicators Aching;Sore                               [facility] Adult PT Treatment/Exercise - 10/11/21 0001         Lumbar Exercises: Stretches   Passive Hamstring Stretch Left;Right;2 reps;30 seconds    Standing Extension 20 seconds;2 reps    Prone on Elbows Stretch 30 seconds   after x10 extension in prone   Prone Mid Back Stretch --    Quad Stretch Right;Left;30 seconds    Other Lumbar Stretch Exercise open close book x10 bilat    Other Lumbar Stretch Exercise hip flexor stretch x30 sec each side      Lumbar Exercises: Aerobic   Nustep L2 x 5 min      Lumbar Exercises: Prone   Other Prone Lumbar Exercises knee bent hip extension 2x10    Other Prone Lumbar Exercises hamstring curl 2x10      Manual Therapy   Manual Therapy Joint mobilization    Joint Mobilization Lumbar and thoracic PA grade III; bilat transverse lumbar and thoracic PAs grade III; grade II to III hip mobs in prone for hip extension    Soft tissue mobilization Lumbar and thoracic paraspinals, QLs                     PT Education - 10/11/21 1644      Education provided Yes    Education Details Discussed importance of performing his HEP -- especially his stretches to keep getting his tightness and knots and maintaining his posture. Discussed with pt that dry needling may be beneficial but dry needling alone will not help his issues and recurrence of knots/tightness.    Person(s) Educated Patient    Methods Explanation;Demonstration;Tactile cues;Verbal cues;Handout    Comprehension Verbalized understanding;Returned demonstration;Verbal cues required;Tactile cues required              PT Short Term Goals - 09/27/21 1620       PT SHORT TERM GOAL #1   Title He will be independent with initial HEP    Baseline re-issued HEP (previous copy lost): 09/27/21    Time 2    Period Weeks    Status On-going               PT Long Term Goals - 09/20/21 1749       PT LONG TERM GOAL #1   Title He will be independent with advanced HEP and safe with a gym    Status On-going      PT LONG TERM GOAL #2   Title He will report pain decreased 75% or more with rotation to look for traffic    Status Partially Met      PT LONG TERM GOAL #3   Title Pt will increase BERG to at least 48/56 to place him at a low risk of falling.    Baseline 42/56: 09/20/21    Status On-going      PT LONG TERM GOAL #4   Title Pt will increase FOTO to 45%.    Status On-going      PT LONG TERM GOAL #5   Title Pt will increase BLE to at least 4+/5 to allow him to return to the gym or golf.    Time 8    Period Weeks    Status On-going                   Plan - 10/11/21 1646     Clinical Impression Statement Continued to work on pt's overall flexibility and joint hypomobility. Increased focus on back and hip extensions this sessions to pull pt out of forward flexed posture. Once again encouraged pt  to perform his HEP or at least his stretches daily.    Personal Factors and Comorbidities Age    Examination-Activity Limitations Locomotion Level;Lift     Examination-Participation Restrictions Community Activity    Stability/Clinical Decision Making Evolving/Moderate complexity    Rehab Potential Good    PT Frequency 2x / week    PT Duration 8 weeks    PT Treatment/Interventions Manual techniques;Dry needling;Moist Heat;Ultrasound;Traction;Passive range of motion;Patient/family education;Electrical Stimulation;Cryotherapy;ADLs/Self Care Home Management;Neuromuscular re-education;Therapeutic exercise;Therapeutic activities;Iontophoresis 35m/ml Dexamethasone;Gait training;Stair training;Functional mobility training;Balance training;Taping;Spinal Manipulations;Joint Manipulations    PT Next Visit Plan MMT LE strength.  Progress balance exercise, core stability, flexibility, strengthening of LE.    PT Home Exercise Plan Access Code: [email or web address]    Consulted and Agree with Plan of Care Patient             Patient will benefit from skilled therapeutic intervention in order to improve the following deficits and impairments:  Decreased activity tolerance, Postural dysfunction, Increased muscle spasms, Pain, Difficulty walking, Impaired flexibility, Decreased mobility, Decreased strength, Decreased range of motion, Decreased balance  Visit Diagnosis: Muscle weakness (generalized)  Cramp and spasm  Chronic low back pain, unspecified back pain laterality, unspecified whether sciatica present  Balance disorder  Difficulty in walking, not elsewhere classified  Cervicalgia     Problem List Patient Active Problem List   Diagnosis Date Noted   HEARTBURN 05/09/2008   ABDOMINAL BLOATING 05/09/2008   HYPERTENSION 05/05/2008   CONSTIPATION 05/05/2008   ARTHRITIS 05/05/2008   SLEEP APNEA 05/05/2008   HERNIORRHAPHY, HX OF 05/05/2008    Shamaya Kauer April Ma L Jone Panebianco, PT, DPT 10/11/2021, 4:47 PM  [facility] [place] [address]Phone: [phone]  Fax:   [phone] Name: Marvin KOPSMRN: [id number] of Birth: 101-Jul-1935

## 2021-10-12 DIAGNOSIS — Z202 Contact with and (suspected) exposure to infections with a predominantly sexual mode of transmission: Secondary | ICD-10-CM | POA: Diagnosis not present

## 2021-10-18 ENCOUNTER — Encounter: Payer: Self-pay | Admitting: Rehabilitative and Restorative Service Providers"

## 2021-10-18 ENCOUNTER — Other Ambulatory Visit: Payer: Self-pay

## 2021-10-18 ENCOUNTER — Ambulatory Visit (INDEPENDENT_AMBULATORY_CARE_PROVIDER_SITE_OTHER): Payer: Medicare Other | Admitting: Rehabilitative and Restorative Service Providers"

## 2021-10-18 DIAGNOSIS — M542 Cervicalgia: Secondary | ICD-10-CM | POA: Diagnosis not present

## 2021-10-18 DIAGNOSIS — M545 Low back pain, unspecified: Secondary | ICD-10-CM

## 2021-10-18 DIAGNOSIS — G8929 Other chronic pain: Secondary | ICD-10-CM | POA: Diagnosis not present

## 2021-10-18 DIAGNOSIS — M6281 Muscle weakness (generalized): Secondary | ICD-10-CM | POA: Diagnosis not present

## 2021-10-18 DIAGNOSIS — R252 Cramp and spasm: Secondary | ICD-10-CM | POA: Diagnosis not present

## 2021-10-18 NOTE — Patient Instructions (Signed)

## 2021-10-18 NOTE — Therapy (Signed)
[place], [place], [address] Phone: [phone]   Fax:  [phone]  Physical Therapy Treatment  Patient Details  Name: Marvin West MRN: [id number] Date of Birth: [birth year] Referring Provider (PT): Dr Lavone Orn   Encounter Date: 10/18/2021   PT End of Session - 10/18/21 1450     Visit Number 8    Number of Visits 16    Date for PT Re-Evaluation 10/19/21    Authorization Type MCR    PT Start Time 1447    PT Stop Time 1535    PT Time Calculation (min) 48 min    Activity Tolerance Patient tolerated treatment well             Past Medical History:  Diagnosis Date   Arthritis    Benign localized prostatic hyperplasia with lower urinary tract symptoms (LUTS)    Chronic back pain    CKD (chronic kidney disease), stage III (HCC)    Diverticulosis    DJD (degenerative joint disease)    ED (erectile dysfunction)    GERD (gastroesophageal reflux disease)    Gout    per last attack over a year 2016 approx.   Hemorrhoids    Hyperlipidemia    Hypertension    Hypogonadism in male    OSA on CPAP    Rectal bleeding    Wears glasses     Past Surgical History:  Procedure Laterality Date   ANAL FISTULOTOMY  07-04-2003     [facility]   and Internal Hemorrhoid Banding   CATARACT EXTRACTION W/ INTRAOCULAR LENS  IMPLANT, BILATERAL  2013 approx.   COLONOSCOPY WITH PROPOFOL N/A 07/27/2013   Procedure: COLONOSCOPY WITH PROPOFOL;  Surgeon: Garlan Fair, MD;  [facility];  Service: Endoscopy;  Laterality: N/A;   HEMORRHOID SURGERY N/A 10/02/2017   Procedure: HEMORRHOIDECTOMY, Anterior and Posterior;  Surgeon: Leighton Ruff, MD;  [facility];  Service: General;  Laterality: N/A;   INCISION AND DRAINAGE PERIRECTAL ABSCESS  03-22-2003     [facility]   INGUINAL HERNIA REPAIR Right 08-14-2004    dr Ninfa Linden [place]  03/ 2015        [facility])   NASAL SINUS  SURGERY  07/29/2001   THUMB ARTHROTOMY/  REMOVAL MULTIPLE LOOSE BODIES/ SYNVECTOMY/  RESECTION MUCOID CYST Left 01-16-2010    dr sypher  [place] Bilateral    [place]    There were no vitals filed for this visit.   Subjective Assessment - 10/18/21 1451     Subjective Patient reports that hs is having some continued back discomfort. He is also having pain in the neck area. Wants to see if dry needling will help any. States that he has had DN done for his neck in the past and it helped.    Currently in Pain? Yes    Pain Score 6     Pain Location Back    Pain Orientation Right;Mid;Lower    Pain Descriptors / Indicators Aching;Sore;Tightness    Pain Type Chronic pain    Pain Onset More than a month ago    Pain Frequency Intermittent                               [facility] Adult PT Treatment/Exercise - 10/18/21 0001  Self-Care   Self-Care Other Self-Care Comments    Other Self-Care Comments  discussed sitting position and modifying recliner for home using pillow and/or noodle      Lumbar Exercises: Aerobic   Nustep L5 x 5 min      Lumbar Exercises: Standing   Other Standing Lumbar Exercises Hip ext x 10 each leg; hip abdct x 10 each leg    Other Standing Lumbar Exercises sliding hands up into shoulder flexion on cabinet 5 sec hold x 10 reps; standing back to wall pushing hips away from wall keeping upper back along the wall 5 sec hold x 10 reps      Electrical Stimulation   Electrical Stimulation Location post dry needling    Electrical Stimulation Action nA x 5 min; microcurrent x 5 min    Electrical Stimulation Parameters to tolerance    Electrical Stimulation Goals Pain;Tone      Manual Therapy   Manual therapy comments skilled palpation to assess response to DN and manual work    Soft tissue mobilization deep tissue work through Oncologist; bilat cervical  paraspinals and upper traps              Trigger Point Dry Needling - 10/18/21 0001     Consent Given? Yes    Education Handout Provided Yes    Electrical Stimulation Performed with Dry Needling Yes    Other Dry Needling bilat    Upper Trapezius Response Palpable increased muscle length    Cervical multifidi Response Palpable increased muscle length    Erector spinae Response Palpable increased muscle length                   PT Education - 10/18/21 1507     Education provided Yes    Education Details DN    Person(s) Educated Patient    Methods Explanation;Handout    Comprehension Verbalized understanding              PT Short Term Goals - 09/27/21 1620       PT SHORT TERM GOAL #1   Title He will be independent with initial HEP    Baseline re-issued HEP (previous copy lost): 09/27/21    Time 2    Period Weeks    Status On-going               PT Long Term Goals - 09/20/21 1749       PT LONG TERM GOAL #1   Title He will be independent with advanced HEP and safe with a gym    Status On-going      PT LONG TERM GOAL #2   Title He will report pain decreased 75% or more with rotation to look for traffic    Status Partially Met      PT LONG TERM GOAL #3   Title Pt will increase BERG to at least 48/56 to place him at a low risk of falling.    Baseline 42/56: 09/20/21    Status On-going      PT LONG TERM GOAL #4   Title Pt will increase FOTO to 45%.    Status On-going      PT LONG TERM GOAL #5   Title Pt will increase BLE to at least 4+/5 to allow him to return to the gym or golf.    Time 8    Period Weeks    Status On-going  Plan - 10/18/21 1453     Clinical Impression Statement Continued discomfort in the lower back more on the Rt than Lt today. Trial of DN for lumbar and cervical musculature - tolerated fair. Note some decreased in muscular tightness to palpation following treatment. Working on Engineer, materials. Offered suggestions for modification of recliner when sitting.    Rehab Potential Good    PT Frequency 2x / week    PT Duration 8 weeks    PT Treatment/Interventions Manual techniques;Dry needling;Moist Heat;Ultrasound;Traction;Passive range of motion;Patient/family education;Electrical Stimulation;Cryotherapy;ADLs/Self Care Home Management;Neuromuscular re-education;Therapeutic exercise;Therapeutic activities;Iontophoresis 63m/ml Dexamethasone;Gait training;Stair training;Functional mobility training;Balance training;Taping;Spinal Manipulations;Joint Manipulations    PT Next Visit Plan MMT LE strength.  Progress balance exercise, core stability, flexibility, strengthening of LE; assess response to DN and manualwork through lumbar and cervical musculature    PT Home Exercise Plan [place] Agree with Plan of Care Patient             Patient will benefit from skilled therapeutic intervention in order to improve the following deficits and impairments:     Visit Diagnosis: Muscle weakness (generalized)  Cramp and spasm  Chronic low back pain, unspecified back pain laterality, unspecified whether sciatica present  Cervicalgia     Problem List Patient Active Problem List   Diagnosis Date Noted   HEARTBURN 05/09/2008   ABDOMINAL BLOATING 05/09/2008   HYPERTENSION 05/05/2008   CONSTIPATION 05/05/2008   ARTHRITIS 05/05/2008   SLEEP APNEA 05/05/2008   HERNIORRHAPHY, HX OF 05/05/2008    CM[place] PT, MPH  10/18/2021, 3:33 PM  [facility] [address]Phone: [phone]  Fax:  [phone] Name: ABRAYLEN STALLERMRN: [id number] of Birth: [birth year]

## 2021-10-25 ENCOUNTER — Encounter: Payer: Self-pay | Admitting: Rehabilitative and Restorative Service Providers"

## 2021-10-25 ENCOUNTER — Ambulatory Visit (INDEPENDENT_AMBULATORY_CARE_PROVIDER_SITE_OTHER): Payer: Medicare Other | Admitting: Rehabilitative and Restorative Service Providers"

## 2021-10-25 DIAGNOSIS — M542 Cervicalgia: Secondary | ICD-10-CM

## 2021-10-25 DIAGNOSIS — M6281 Muscle weakness (generalized): Secondary | ICD-10-CM | POA: Diagnosis not present

## 2021-10-25 DIAGNOSIS — G8929 Other chronic pain: Secondary | ICD-10-CM

## 2021-10-25 DIAGNOSIS — R252 Cramp and spasm: Secondary | ICD-10-CM | POA: Diagnosis not present

## 2021-10-25 DIAGNOSIS — M545 Low back pain, unspecified: Secondary | ICD-10-CM

## 2021-10-25 DIAGNOSIS — E291 Testicular hypofunction: Secondary | ICD-10-CM | POA: Diagnosis not present

## 2021-10-25 NOTE — Addendum Note (Signed)
Addended by: Everardo All on: 10/25/2021 01:37 PM   Modules accepted: Orders

## 2021-10-25 NOTE — Therapy (Addendum)
[place] [address] [place], [place], [address] Phone: [phone]   Fax:  [phone]  Physical Therapy Treatment; Medicare 37CW vsit note; Recertification   Progress Note Reporting Period 08/29/21 to 10/25/21  See note below for Objective Data and Assessment of Progress/Goals.      Patient Details  Name: Marvin West MRN: [id number] Date of Birth: 1934/04/05 Referring Provider (PT): Dr Lavone Orn   Encounter Date: 10/25/2021   PT End of Session - 10/25/21 1112     Visit Number 10    Number of Visits 22    Date for PT Re-Evaluation 12/06/21    Authorization Type MCR    Progress Note Due on Visit 20    PT Start Time 1108   patient late for appt   PT Stop Time 1146    PT Time Calculation (min) 38 min    Activity Tolerance Patient tolerated treatment well             Past Medical History:  Diagnosis Date   Arthritis    Benign localized prostatic hyperplasia with lower urinary tract symptoms (LUTS)    Chronic back pain    CKD (chronic kidney disease), stage III (HCC)    Diverticulosis    DJD (degenerative joint disease)    ED (erectile dysfunction)    GERD (gastroesophageal reflux disease)    Gout    per last attack over a year 2016 approx.   Hemorrhoids    Hyperlipidemia    Hypertension    Hypogonadism in male    OSA on CPAP    Rectal bleeding    Wears glasses     Past Surgical History:  Procedure Laterality Date   ANAL FISTULOTOMY  07-04-2003     [facility]   and Internal Hemorrhoid Banding   CATARACT EXTRACTION W/ INTRAOCULAR LENS  IMPLANT, BILATERAL  2013 approx.   COLONOSCOPY WITH PROPOFOL N/A 07/27/2013   Procedure: COLONOSCOPY WITH PROPOFOL;  Surgeon: Garlan Fair, MD;  Location: [facility];  Service: Endoscopy;  Laterality: N/A;   HEMORRHOID SURGERY N/A 10/02/2017   Procedure: HEMORRHOIDECTOMY, Anterior and Posterior;  Surgeon: Leighton Ruff, MD;  Location: [facility];  Service: General;  Laterality: N/A;   INCISION AND DRAINAGE PERIRECTAL ABSCESS  03-22-2003     [facility]   INGUINAL HERNIA REPAIR Right 08-14-2004    dr Ninfa Linden [place]  03/ 2015        [facility])   NASAL SINUS SURGERY  07/29/2001   THUMB ARTHROTOMY/  REMOVAL MULTIPLE LOOSE BODIES/ SYNVECTOMY/  RESECTION MUCOID CYST Left 01-16-2010    dr sypher  [place] Bilateral    [place]    There were no vitals filed for this visit.   Subjective Assessment - 10/25/21 1113     Subjective Patient reports that he had some pain in his neck last night but put some heat on it and it felt better. Thinks the DN helped some last visit. Did find a swim noodle but it doesn't fit in his recliner.    Pain Score 0-No pain    Pain Location Back    Pain Score 0    Pain Location Neck    Pain Orientation Right;Left    Pain Descriptors / Indicators Sore                [facility] PT Assessment - 10/25/21 0001  Assessment   Medical Diagnosis M15.9 (ICD-10-CM) - Polyosteoarthritis, unspecified    Referring Provider (PT) Dr Lavone Orn    Hand Dominance Right    Prior Therapy for the neck       AROM   Overall AROM Comments limited cervical ROM in all planes      Palpation   Palpation comment muscular tightness noted in ant/lat/post cervical musculature through the upper traps and into pecs bilat; Rt > Lt                           [facility] Adult PT Treatment/Exercise - 10/25/21 0001       Neck Exercises: Seated   Other Seated Exercise neck retractions 5x10sec    Other Seated Exercise lateral crvical flexion x 5 x 5 sec hold      Neck Exercises: Stretches   Other Neck Stretches doorway stretch 20 sec x 2 reps lower position; id to higher position 20 sec x 2 without stepping into doorway      Lumbar Exercises: Aerobic   UBE (Upper Arm Bike) L3 x 4 min alternating fwd/back      Lumbar  Exercises: Standing   Wall Slides 10 reps    Wall Slides Limitations 10 sec hold VC to tighten core    Row Strengthening;Both;10 reps    Theraband Level (Row) Level 3 (Green)    Row Limitations bow and arrow green x 10 each side    Other Standing Lumbar Exercises Hip ext x 10 each leg; hip abdct x 10 each leg    Other Standing Lumbar Exercises sliding hands up into shoulder flexion on cabinet 5 sec hold x 10 reps; standing back to wall pushing hips away from wall keeping upper back along the wall 5 sec hold x 10 reps      Lumbar Exercises: Seated   Sit to Stand 10 reps    Sit to Stand Limitations slow eccentric stand to sit with VC to tighten core      Manual Therapy   Soft tissue mobilization soft tissue mobilization through bilat cervicla Rt > Lt in areas of tightness lat/posterior cervical musculature and into upper traps                     PT Education - 10/25/21 1141     Education provided Yes    Education Details HEP    Person(s) Educated Patient    Methods Explanation;Demonstration;Tactile cues;Verbal cues;Handout    Comprehension Verbalized understanding;Returned demonstration;Verbal cues required;Tactile cues required              PT Short Term Goals - 09/27/21 1620       PT SHORT TERM GOAL #1   Title He will be independent with initial HEP    Baseline re-issued HEP (previous copy lost): 09/27/21    Time 2    Period Weeks    Status On-going               PT Long Term Goals - 10/25/21 1330       PT LONG TERM GOAL #1   Title He will be independent with advanced HEP and safe with a gym    Time 6    Period Weeks    Status On-going    Target Date 12/06/21      PT LONG TERM GOAL #2   Title He will report pain decreased 75% or more with rotation to look  for traffic    Baseline -    Time 6    Period Weeks    Status On-going    Target Date 12/06/21      PT LONG TERM GOAL #3   Title Pt will increase BERG to at least 48/56 to place him at a  low risk of falling.    Baseline 42/56: 09/20/21    Time 8    Period Weeks    Status Achieved    Target Date 12/06/21      PT LONG TERM GOAL #4   Title Pt will increase FOTO to 45%.    Time 6    Period Weeks    Status On-going    Target Date 12/06/21      PT LONG TERM GOAL #5   Title Pt will increase BLE to at least 4+/5 to allow him to return to the gym or golf.    Time 6    Period Weeks    Status On-going    Target Date 12/06/21                   Plan - 10/25/21 1117     Clinical Impression Statement Patient reports that he is not having pain today. He has tried to correct sitting posture but still is sitting with flexed forward posture and falls asleep sitting in his recliner at night. He has muscular tighness through the Rt posteriorlateral cervical musculature into upper trap as well as decreased cervical mobilty. Added exercises for stretching and strengthening. Patient continues to work toward stated goals of therapy.    Rehab Potential Good    PT Frequency 2x / week    PT Duration 8 weeks    PT Treatment/Interventions Manual techniques;Dry needling;Moist Heat;Ultrasound;Traction;Passive range of motion;Patient/family education;Electrical Stimulation;Cryotherapy;ADLs/Self Care Home Management;Neuromuscular re-education;Therapeutic exercise;Therapeutic activities;Iontophoresis 4mg /ml Dexamethasone;Gait training;Stair training;Functional mobility training;Balance training;Taping;Spinal Manipulations;Joint Manipulations    PT Next Visit Plan MMT LE strength.  Progress balance exercise, core stability, flexibility, strengthening of LE; assess response to DN and manual work through cervical musculature - 10th visit note at next visit    PT [place] and Agree with Plan of Care Patient             Patient will benefit from skilled therapeutic intervention in order to improve the following deficits and impairments:     Visit  Diagnosis: Muscle weakness (generalized)  Cramp and spasm  Chronic low back pain, unspecified back pain laterality, unspecified whether sciatica present  Cervicalgia     Problem List Patient Active Problem List   Diagnosis Date Noted   HEARTBURN 05/09/2008   ABDOMINAL BLOATING 05/09/2008   HYPERTENSION 05/05/2008   CONSTIPATION 05/05/2008   ARTHRITIS 05/05/2008   SLEEP APNEA 05/05/2008   HERNIORRHAPHY, HX OF 05/05/2008    [place], PT, MPH  10/25/2021, 1:33 PM  [facility] [place], [place], [address] Phone: [phone]   Fax:  [phone]  Name: Marvin West MRN: [id number] Date of Birth: 05/13/1934

## 2021-10-25 NOTE — Patient Instructions (Signed)
Access Code: [email or web address] URL: [email or web address] Date: 10/25/2021 Prepared by: Gillermo Murdoch  Exercises Standing Hip Extension with Counter Support - 2 x daily - 7 x weekly - 2 sets - 10 reps Standing Single Leg Stance with Counter Support - 1 x daily - 7 x weekly - 1 sets - 3 reps - 10 seconds hold Standing Row with Anchored Resistance - 1 x daily - 7 x weekly - 2 sets - 10 reps Seated Hamstring Stretch - 1 x daily - 7 x weekly - 1 sets - 2-3 reps - 15 seconds hold Seated Figure 4 Piriformis Stretch - 1 x daily - 7 x weekly - 1 sets - 2-3 reps - 15 seconds hold Sidelying Thoracic Rotation with Open Book - 1 x daily - 7 x weekly - 1 sets - 10 reps - 3 sec hold Prone Quad Stretch with Towel Roll and Strap - 1 x daily - 7 x weekly - 2 sets - 30 sec hold Prone Knee Flexion - 1 x daily - 7 x weekly - 3 sets - 10 reps Standing Lumbar Extension with Counter - 1 x daily - 7 x weekly - 2 sets - 30 sec hold Prone Hip Extension with Bent Knee - 1 x daily - 7 x weekly - 2 sets - 10 reps Seated Cervical Retraction - 2 x daily - 7 x weekly - 1-2 sets - 5-10 reps - 10 sec hold Neck Sidebend Stretch - 2 x daily - 7 x weekly - 1 sets - 5 reps - 5 sec hold Seated Scapular Retraction - 2 x daily - 7 x weekly - 1-2 sets - 10 reps - 10 sec hold Standing Bilateral Low Shoulder Row with Anchored Resistance - 2 x daily - 7 x weekly - 1-3 sets - 10 reps - 2-3 sec hold Drawing Bow - 1 x daily - 7 x weekly - 1 sets - 10 reps - 3 sec hold Sit to Stand - 2 x daily - 7 x weekly - 1 sets - 10 reps - 3-5 sec hold

## 2021-11-01 ENCOUNTER — Other Ambulatory Visit: Payer: Self-pay

## 2021-11-01 ENCOUNTER — Ambulatory Visit (INDEPENDENT_AMBULATORY_CARE_PROVIDER_SITE_OTHER): Payer: Medicare Other | Admitting: Physical Therapy

## 2021-11-01 DIAGNOSIS — M542 Cervicalgia: Secondary | ICD-10-CM | POA: Diagnosis not present

## 2021-11-01 DIAGNOSIS — R262 Difficulty in walking, not elsewhere classified: Secondary | ICD-10-CM | POA: Diagnosis not present

## 2021-11-01 DIAGNOSIS — R252 Cramp and spasm: Secondary | ICD-10-CM | POA: Diagnosis not present

## 2021-11-01 DIAGNOSIS — M545 Low back pain, unspecified: Secondary | ICD-10-CM | POA: Diagnosis not present

## 2021-11-01 DIAGNOSIS — R2689 Other abnormalities of gait and mobility: Secondary | ICD-10-CM | POA: Diagnosis not present

## 2021-11-01 DIAGNOSIS — M6281 Muscle weakness (generalized): Secondary | ICD-10-CM | POA: Diagnosis not present

## 2021-11-01 DIAGNOSIS — G8929 Other chronic pain: Secondary | ICD-10-CM | POA: Diagnosis not present

## 2021-11-01 NOTE — Therapy (Signed)
[place], [place], [address] Phone: [phone]   Fax:  [phone]  Physical Therapy Treatment  Patient Details  Name: Marvin West MRN: [id number] Date of Birth: 09-13-1934 Referring Provider (PT): Dr Lavone Orn   Encounter Date: 11/01/2021   PT End of Session - 11/01/21 1406     Visit Number 11    Number of Visits 22    Date for PT Re-Evaluation 12/06/21    Authorization Type MCR    Progress Note Due on Visit 20    PT Start Time 1324    PT Stop Time 1445    PT Time Calculation (min) 40 min    Activity Tolerance Patient tolerated treatment well    Behavior During Therapy [facility] for tasks assessed/performed             Past Medical History:  Diagnosis Date   Arthritis    Benign localized prostatic hyperplasia with lower urinary tract symptoms (LUTS)    Chronic back pain    CKD (chronic kidney disease), stage III (HCC)    Diverticulosis    DJD (degenerative joint disease)    ED (erectile dysfunction)    GERD (gastroesophageal reflux disease)    Gout    per last attack over a year 2016 approx.   Hemorrhoids    Hyperlipidemia    Hypertension    Hypogonadism in male    OSA on CPAP    Rectal bleeding    Wears glasses     Past Surgical History:  Procedure Laterality Date   ANAL FISTULOTOMY  07-04-2003     [facility]   and Internal Hemorrhoid Banding   CATARACT EXTRACTION W/ INTRAOCULAR LENS  IMPLANT, BILATERAL  2013 approx.   COLONOSCOPY WITH PROPOFOL N/A 07/27/2013   Procedure: COLONOSCOPY WITH PROPOFOL;  Surgeon: Garlan Fair, MD;  Location: [facility];  Service: Endoscopy;  Laterality: N/A;   HEMORRHOID SURGERY N/A 10/02/2017   Procedure: HEMORRHOIDECTOMY, Anterior and Posterior;  Surgeon: Leighton Ruff, MD;  Location: [facility];  Service: General;  Laterality: N/A;   INCISION AND DRAINAGE PERIRECTAL ABSCESS  03-22-2003     [facility]   INGUINAL HERNIA REPAIR  Right 08-14-2004    dr Ninfa Linden [place]  03/ 2015        [facility])   NASAL SINUS SURGERY  07/29/2001   THUMB ARTHROTOMY/  REMOVAL MULTIPLE LOOSE BODIES/ SYNVECTOMY/  RESECTION MUCOID CYST Left 01-16-2010    dr sypher  [place] Bilateral    [place]    There were no vitals filed for this visit.   Subjective Assessment - 11/01/21 1406     Subjective Pt reports just his arthritis pain. Pt notes pain down R hip and low back last night but does not endorse it this afternoon. Pt states just a headache today. Pt states he had a problem a few days ago on the right side of his neck but he put heat on it and it felt better.    Pertinent History OA    Limitations Lifting;House hold activities;Walking    Patient Stated Goals To feel better and have less pain and improve balance.    Currently in Pain? --    Pain Score 6     Pain Location Generalized  [place] Adult PT Treatment/Exercise - 11/01/21 0001       Neck Exercises: Machines for Strengthening   UBE (Upper Arm Bike) L3, 3 min forward and then 3 min backward      Neck Exercises: Seated   Other Seated Exercise neck retractions 5x10sec    Other Seated Exercise lateral crvical flexion x 5 x 5 sec hold      Neck Exercises: Stretches   Upper Trapezius Stretch Right;Left;10 seconds;2 reps    Levator Stretch Right;Left;30 seconds    Other Neck Stretches doorway stretch 20 sec x 2 reps lower position; id to higher position 20 sec x 2 without stepping into doorway    Other Neck Stretches neck rotation with towel 3x20 sec      Lumbar Exercises: Standing   Row Strengthening;Both;10 reps   2 sets   Theraband Level (Row) Level 3 (Green)    Other Standing Lumbar Exercises palloff press green tband 2x10    Other Standing Lumbar Exercises Wall slides x10, mini deadlifts against wall 3x10      Manual Therapy    Joint Mobilization cervical side glides and mobs for rotation    Soft tissue mobilization soft tissue mobilization through bilat cervicla Rt > Lt in areas of tightness lat/posterior cervical musculature and into upper traps                       PT Short Term Goals - 09/27/21 1620       PT SHORT TERM GOAL #1   Title He will be independent with initial HEP    Baseline re-issued HEP (previous copy lost): 09/27/21    Time 2    Period Weeks    Status On-going               PT Long Term Goals - 10/25/21 1330       PT LONG TERM GOAL #1   Title He will be independent with advanced HEP and safe with a gym    Time 6    Period Weeks    Status On-going    Target Date 12/06/21      PT LONG TERM GOAL #2   Title He will report pain decreased 75% or more with rotation to look for traffic    Baseline -    Time 6    Period Weeks    Status On-going    Target Date 12/06/21      PT LONG TERM GOAL #3   Title Pt will increase BERG to at least 48/56 to place him at a low risk of falling.    Baseline 42/56: 09/20/21    Time 8    Period Weeks    Status Achieved    Target Date 12/06/21      PT LONG TERM GOAL #4   Title Pt will increase FOTO to 45%.    Time 6    Period Weeks    Status On-going    Target Date 12/06/21      PT LONG TERM GOAL #5   Title Pt will increase BLE to at least 4+/5 to allow him to return to the gym or golf.    Time 6    Period Weeks    Status On-going    Target Date 12/06/21                   Plan - 11/01/21 1449     Clinical Impression Statement Continued to work  on improving neck and back posture. Worked on improving neck rotation this session. Initiated mini dead lifts. Pt better able to correct his trunk flexion but requires cueing. Pt continues to make gains towards his LTGs.    Personal Factors and Comorbidities Age    Examination-Activity Limitations Locomotion Level;Lift    Examination-Participation Restrictions Community  Activity    Rehab Potential Good    PT Frequency 2x / week    PT Duration 8 weeks    PT Treatment/Interventions Manual techniques;Dry needling;Moist Heat;Ultrasound;Traction;Passive range of motion;Patient/family education;Electrical Stimulation;Cryotherapy;ADLs/Self Care Home Management;Neuromuscular re-education;Therapeutic exercise;Therapeutic activities;Iontophoresis 4mg /ml Dexamethasone;Gait training;Stair training;Functional mobility training;Balance training;Taping;Spinal Manipulations;Joint Manipulations    PT Next Visit Plan MMT LE strength.  Progress balance exercise, core stability, flexibility, strengthening of LE; assess response to DN and manual work through cervical musculature    PT [place] and Agree with Plan of Care Patient             Patient will benefit from skilled therapeutic intervention in order to improve the following deficits and impairments:  Decreased activity tolerance, Postural dysfunction, Increased muscle spasms, Pain, Difficulty walking, Impaired flexibility, Decreased mobility, Decreased strength, Decreased range of motion, Decreased balance  Visit Diagnosis: Muscle weakness (generalized)  Cramp and spasm  Chronic low back pain, unspecified back pain laterality, unspecified whether sciatica present  Cervicalgia  Balance disorder  Difficulty in walking, not elsewhere classified     Problem List Patient Active Problem List   Diagnosis Date Noted   HEARTBURN 05/09/2008   ABDOMINAL BLOATING 05/09/2008   HYPERTENSION 05/05/2008   CONSTIPATION 05/05/2008   ARTHRITIS 05/05/2008   SLEEP APNEA 05/05/2008   HERNIORRHAPHY, HX OF 05/05/2008    Shaine Mount April Gordy Levan, PT, DPT 11/01/2021, 2:55 PM  [facility] [place] [address] [place], [place], [address] Phone: [phone]   Fax:  [phone]  Name: MAT STUARD MRN: [id number] Date of Birth:  1934/01/15

## 2021-11-08 ENCOUNTER — Encounter: Payer: Self-pay | Admitting: Rehabilitative and Restorative Service Providers"

## 2021-11-08 ENCOUNTER — Ambulatory Visit (INDEPENDENT_AMBULATORY_CARE_PROVIDER_SITE_OTHER): Payer: Medicare Other | Admitting: Rehabilitative and Restorative Service Providers"

## 2021-11-08 ENCOUNTER — Other Ambulatory Visit: Payer: Self-pay

## 2021-11-08 DIAGNOSIS — R252 Cramp and spasm: Secondary | ICD-10-CM | POA: Diagnosis not present

## 2021-11-08 DIAGNOSIS — G8929 Other chronic pain: Secondary | ICD-10-CM | POA: Diagnosis not present

## 2021-11-08 DIAGNOSIS — M545 Low back pain, unspecified: Secondary | ICD-10-CM

## 2021-11-08 DIAGNOSIS — M542 Cervicalgia: Secondary | ICD-10-CM | POA: Diagnosis not present

## 2021-11-08 DIAGNOSIS — M6281 Muscle weakness (generalized): Secondary | ICD-10-CM | POA: Diagnosis not present

## 2021-11-08 NOTE — Therapy (Addendum)
[place], [place], [address] Phone: [phone]   Fax:  [phone]  Physical Therapy Treatment and Discharge Summary  PHYSICAL THERAPY DISCHARGE SUMMARY  Visits from Start of Care: 12  Current functional level related to goals / functional outcomes: See progress note for discharge status.   Remaining deficits: Continued intermittent pain    Education / Equipment: HEP  Patient agrees to discharge. Patient goals were partially met. Patient is being discharged due to not returning since the last visit.  Soraiya Ahner P. Helene Kelp PT, MPH 01/02/22 3:30 PM    Patient Details  Name: Marvin West MRN: [id number] Date of Birth: 1934/04/18 Referring Provider (PT): Dr Lavone Orn   Encounter Date: 11/08/2021   PT End of Session - 11/08/21 1451     Visit Number 12    Number of Visits 22    Date for PT Re-Evaluation 12/06/21    Authorization Type MCR    Progress Note Due on Visit 20    PT Start Time 1450   late for appt   PT Stop Time 1145    PT Time Calculation (min) 1255 min    Activity Tolerance Patient tolerated treatment well             Past Medical History:  Diagnosis Date   Arthritis    Benign localized prostatic hyperplasia with lower urinary tract symptoms (LUTS)    Chronic back pain    CKD (chronic kidney disease), stage III (HCC)    Diverticulosis    DJD (degenerative joint disease)    ED (erectile dysfunction)    GERD (gastroesophageal reflux disease)    Gout    per last attack over a year 2016 approx.   Hemorrhoids    Hyperlipidemia    Hypertension    Hypogonadism in male    OSA on CPAP    Rectal bleeding    Wears glasses     Past Surgical History:  Procedure Laterality Date   ANAL FISTULOTOMY  07-04-2003     [facility]   and Internal Hemorrhoid Banding   CATARACT EXTRACTION W/ INTRAOCULAR LENS  IMPLANT, BILATERAL  2013 approx.   COLONOSCOPY WITH PROPOFOL N/A 07/27/2013    Procedure: COLONOSCOPY WITH PROPOFOL;  Surgeon: Garlan Fair, MD;  Location: [facility];  Service: Endoscopy;  Laterality: N/A;   HEMORRHOID SURGERY N/A 10/02/2017   Procedure: HEMORRHOIDECTOMY, Anterior and Posterior;  Surgeon: Leighton Ruff, MD;  Location: [facility];  Service: General;  Laterality: N/A;   INCISION AND DRAINAGE PERIRECTAL ABSCESS  03-22-2003     [facility]   INGUINAL HERNIA REPAIR Right 08-14-2004    dr Ninfa Linden [place]  03/ 2015        [facility])   NASAL SINUS SURGERY  07/29/2001   THUMB ARTHROTOMY/  REMOVAL MULTIPLE LOOSE BODIES/ SYNVECTOMY/  RESECTION MUCOID CYST Left 01-16-2010    dr sypher  [place] Bilateral    [place]    There were no vitals filed for this visit.   Subjective Assessment - 11/08/21 1452     Subjective Patient reports that he is feeling pretty good - still hurts on and off. Tries not to think about it. He is doing some of his exercises at home. Maybe due to his pillows. Tries not to sleep sitting in his chair    Currently in Pain? Yes    Pain  Score 5     Pain Location Generalized                [facility] PT Assessment - 11/08/21 0001       Assessment   Medical Diagnosis M15.9 (ICD-10-CM) - Polyosteoarthritis, unspecified    Referring Provider (PT) Dr Lavone Orn    Hand Dominance Right    Prior Therapy for the neck       AROM   Cervical Flexion 45    Cervical Extension 40    Cervical - Right Rotation 36    Cervical - Left Rotation 35      Palpation   Palpation comment muscular tightness noted in ant/lat/post cervical musculature through the upper traps and into pecs bilat; Rt > Lt                           [facility] Adult PT Treatment/Exercise - 11/08/21 0001       Neck Exercises: Machines for Strengthening   UBE (Upper Arm Bike) L3, 3 min forward and then 3 min backward      Neck Exercises: Standing    Other Standing Exercises backward shoulder rolls x 20 reps      Neck Exercises: Seated   Other Seated Exercise neck retractions 5x10sec    Other Seated Exercise lateral crvical flexion x 5 x 5 sec hold      Neck Exercises: Stretches   Upper Trapezius Stretch Right;Left;10 seconds;2 reps    Levator Stretch Right;Left;30 seconds    Other Neck Stretches doorway stretch 20 sec x 2 reps lower position; mid to higher position 20 sec x 2 without stepping into doorway just resting hands on doorframe    Other Neck Stretches cervical rotation to each side x 5 reps      Lumbar Exercises: Standing   Wall Slides 10 reps   2 sets   Row Strengthening;Both;10 reps   2 sets   Theraband Level (Row) Level 3 (Green)    Row Limitations bow and arrow    Shoulder Extension Limitations W's green TB x 10 x 2 sets    Other Standing Lumbar Exercises palloff press green tband 2x10    Other Standing Lumbar Exercises back at wall pressing hips away from wall 5 sec hold x 10 x 2 sets      Manual Therapy   Soft tissue mobilization soft tissue mobilization through bilat cervical Rt > Lt in areas of tightness lat/posterior cervical musculature and into upper traps    Other Manual Therapy pt sitting                       PT Short Term Goals - 09/27/21 1620       PT SHORT TERM GOAL #1   Title He will be independent with initial HEP    Baseline re-issued HEP (previous copy lost): 09/27/21    Time 2    Period Weeks    Status On-going               PT Long Term Goals - 10/25/21 1330       PT LONG TERM GOAL #1   Title He will be independent with advanced HEP and safe with a gym    Time 6    Period Weeks    Status On-going    Target Date 12/06/21      PT LONG TERM GOAL #2   Title He will  report pain decreased 75% or more with rotation to look for traffic    Baseline -    Time 6    Period Weeks    Status On-going    Target Date 12/06/21      PT LONG TERM GOAL #3   Title Pt will  increase BERG to at least 48/56 to place him at a low risk of falling.    Baseline 42/56: 09/20/21    Time 8    Period Weeks    Status Achieved    Target Date 12/06/21      PT LONG TERM GOAL #4   Title Pt will increase FOTO to 45%.    Time 6    Period Weeks    Status On-going    Target Date 12/06/21      PT LONG TERM GOAL #5   Title Pt will increase BLE to at least 4+/5 to allow him to return to the gym or golf.    Time 6    Period Weeks    Status On-going    Target Date 12/06/21                   Plan - 11/08/21 1454     Clinical Impression Statement Continued forward posture with head, neck and shoulders. Working on postural control and stabilization.    Rehab Potential Good    PT Frequency 2x / week    PT Duration 8 weeks    PT Treatment/Interventions Manual techniques;Dry needling;Moist Heat;Ultrasound;Traction;Passive range of motion;Patient/family education;Electrical Stimulation;Cryotherapy;ADLs/Self Care Home Management;Neuromuscular re-education;Therapeutic exercise;Therapeutic activities;Iontophoresis 32m/ml Dexamethasone;Gait training;Stair training;Functional mobility training;Balance training;Taping;Spinal Manipulations;Joint Manipulations    PT Next Visit Plan MMT LE strength.  Progress balance exercise, core stability, flexibility, strengthening of LE; assess response to DN and manual work through cervical musculature    PT HGlen Campbelland Agree with Plan of Care Patient             Patient will benefit from skilled therapeutic intervention in order to improve the following deficits and impairments:     Visit Diagnosis: Muscle weakness (generalized)  Cramp and spasm  Chronic low back pain, unspecified back pain laterality, unspecified whether sciatica present  Cervicalgia     Problem List Patient Active Problem List   Diagnosis Date Noted   HEARTBURN 05/09/2008   ABDOMINAL BLOATING 05/09/2008   HYPERTENSION  05/05/2008   CONSTIPATION 05/05/2008   ARTHRITIS 05/05/2008   SLEEP APNEA 05/05/2008   HERNIORRHAPHY, HX OF 05/05/2008    COpal PT, MPH  11/08/2021, 3:30 PM  [facility] [place] [address]Phone: [phone]  Fax:  [phone] Name: Marvin BUCKALEWMRN: [id number] of Birth: [birth year]

## 2021-11-14 DIAGNOSIS — E291 Testicular hypofunction: Secondary | ICD-10-CM | POA: Diagnosis not present

## 2021-11-22 ENCOUNTER — Telehealth: Payer: Self-pay | Admitting: Physical Therapy

## 2021-11-22 ENCOUNTER — Encounter: Payer: Medicare Other | Admitting: Physical Therapy

## 2021-11-22 NOTE — Telephone Encounter (Signed)
Attempted to call pt in regards to a "no show" to his 11:45am appointment. Phone kept ringing with no answer. Unable to LVM.   Brinlyn Cena April Gordy Levan, PT, DPT

## 2021-11-27 DIAGNOSIS — E291 Testicular hypofunction: Secondary | ICD-10-CM | POA: Diagnosis not present

## 2021-11-27 DIAGNOSIS — A64 Unspecified sexually transmitted disease: Secondary | ICD-10-CM | POA: Diagnosis not present

## 2021-12-21 DIAGNOSIS — I129 Hypertensive chronic kidney disease with stage 1 through stage 4 chronic kidney disease, or unspecified chronic kidney disease: Secondary | ICD-10-CM | POA: Diagnosis not present

## 2021-12-21 DIAGNOSIS — R350 Frequency of micturition: Secondary | ICD-10-CM | POA: Diagnosis not present

## 2021-12-26 DIAGNOSIS — I129 Hypertensive chronic kidney disease with stage 1 through stage 4 chronic kidney disease, or unspecified chronic kidney disease: Secondary | ICD-10-CM | POA: Diagnosis not present

## 2022-01-18 DIAGNOSIS — I129 Hypertensive chronic kidney disease with stage 1 through stage 4 chronic kidney disease, or unspecified chronic kidney disease: Secondary | ICD-10-CM | POA: Diagnosis not present

## 2022-01-18 DIAGNOSIS — N5201 Erectile dysfunction due to arterial insufficiency: Secondary | ICD-10-CM | POA: Diagnosis not present

## 2022-01-18 DIAGNOSIS — N400 Enlarged prostate without lower urinary tract symptoms: Secondary | ICD-10-CM | POA: Diagnosis not present

## 2022-01-18 DIAGNOSIS — W19XXXA Unspecified fall, initial encounter: Secondary | ICD-10-CM | POA: Diagnosis not present

## 2022-01-18 DIAGNOSIS — N1831 Chronic kidney disease, stage 3a: Secondary | ICD-10-CM | POA: Diagnosis not present

## 2022-01-18 DIAGNOSIS — E291 Testicular hypofunction: Secondary | ICD-10-CM | POA: Diagnosis not present

## 2022-02-01 DIAGNOSIS — E291 Testicular hypofunction: Secondary | ICD-10-CM | POA: Diagnosis not present

## 2022-02-14 DIAGNOSIS — E291 Testicular hypofunction: Secondary | ICD-10-CM | POA: Diagnosis not present

## 2022-02-27 DIAGNOSIS — M25551 Pain in right hip: Secondary | ICD-10-CM | POA: Diagnosis not present

## 2022-02-27 DIAGNOSIS — M79641 Pain in right hand: Secondary | ICD-10-CM | POA: Diagnosis not present

## 2022-02-27 DIAGNOSIS — M79642 Pain in left hand: Secondary | ICD-10-CM | POA: Diagnosis not present

## 2022-02-28 DIAGNOSIS — N400 Enlarged prostate without lower urinary tract symptoms: Secondary | ICD-10-CM | POA: Diagnosis not present

## 2022-02-28 DIAGNOSIS — N3281 Overactive bladder: Secondary | ICD-10-CM | POA: Diagnosis not present

## 2022-03-13 DIAGNOSIS — M79642 Pain in left hand: Secondary | ICD-10-CM | POA: Diagnosis not present

## 2022-03-13 DIAGNOSIS — N400 Enlarged prostate without lower urinary tract symptoms: Secondary | ICD-10-CM | POA: Diagnosis not present

## 2022-03-13 DIAGNOSIS — S0990XA Unspecified injury of head, initial encounter: Secondary | ICD-10-CM | POA: Diagnosis not present

## 2022-03-13 DIAGNOSIS — R2689 Other abnormalities of gait and mobility: Secondary | ICD-10-CM | POA: Diagnosis not present

## 2022-03-13 DIAGNOSIS — E291 Testicular hypofunction: Secondary | ICD-10-CM | POA: Diagnosis not present

## 2022-03-13 DIAGNOSIS — W19XXXA Unspecified fall, initial encounter: Secondary | ICD-10-CM | POA: Diagnosis not present

## 2022-03-13 DIAGNOSIS — N3281 Overactive bladder: Secondary | ICD-10-CM | POA: Diagnosis not present

## 2022-03-22 DIAGNOSIS — I10 Essential (primary) hypertension: Secondary | ICD-10-CM | POA: Diagnosis not present

## 2022-03-22 DIAGNOSIS — N1831 Chronic kidney disease, stage 3a: Secondary | ICD-10-CM | POA: Diagnosis not present

## 2022-03-22 DIAGNOSIS — I129 Hypertensive chronic kidney disease with stage 1 through stage 4 chronic kidney disease, or unspecified chronic kidney disease: Secondary | ICD-10-CM | POA: Diagnosis not present

## 2022-03-22 DIAGNOSIS — E7800 Pure hypercholesterolemia, unspecified: Secondary | ICD-10-CM | POA: Diagnosis not present

## 2022-03-25 DIAGNOSIS — I129 Hypertensive chronic kidney disease with stage 1 through stage 4 chronic kidney disease, or unspecified chronic kidney disease: Secondary | ICD-10-CM | POA: Diagnosis not present

## 2022-03-25 DIAGNOSIS — N400 Enlarged prostate without lower urinary tract symptoms: Secondary | ICD-10-CM | POA: Diagnosis not present

## 2022-03-25 DIAGNOSIS — I10 Essential (primary) hypertension: Secondary | ICD-10-CM | POA: Diagnosis not present

## 2022-03-25 DIAGNOSIS — N1831 Chronic kidney disease, stage 3a: Secondary | ICD-10-CM | POA: Diagnosis not present

## 2022-03-25 DIAGNOSIS — G8929 Other chronic pain: Secondary | ICD-10-CM | POA: Diagnosis not present

## 2022-03-25 DIAGNOSIS — E7800 Pure hypercholesterolemia, unspecified: Secondary | ICD-10-CM | POA: Diagnosis not present

## 2022-03-28 DIAGNOSIS — E291 Testicular hypofunction: Secondary | ICD-10-CM | POA: Diagnosis not present

## 2022-04-14 ENCOUNTER — Other Ambulatory Visit: Payer: Self-pay

## 2022-04-14 ENCOUNTER — Emergency Department (INDEPENDENT_AMBULATORY_CARE_PROVIDER_SITE_OTHER): Payer: Medicare Other

## 2022-04-14 ENCOUNTER — Emergency Department (INDEPENDENT_AMBULATORY_CARE_PROVIDER_SITE_OTHER)
Admission: EM | Admit: 2022-04-14 | Discharge: 2022-04-14 | Disposition: A | Discharge status: Home / Self Care | Payer: Medicare Other | Source: Home / Self Care

## 2022-04-14 DIAGNOSIS — M25551 Pain in right hip: Secondary | ICD-10-CM

## 2022-04-14 DIAGNOSIS — G8929 Other chronic pain: Secondary | ICD-10-CM | POA: Diagnosis not present

## 2022-04-14 DIAGNOSIS — M545 Low back pain, unspecified: Secondary | ICD-10-CM | POA: Diagnosis not present

## 2022-04-14 MED ORDER — TRAMADOL HCL 50 MG PO TABS: 50.0000 mg | ORAL_TABLET | Freq: Every day | ORAL | 0 refills | Status: DC | PRN

## 2022-04-14 NOTE — ED Provider Notes (Signed)
?[place] ? ? ? ?CSN: [id number] ?Arrival date & time: 04/14/22  1115 ? ? ?  ? ?History   ?Chief Complaint ?Chief Complaint  ?Patient presents with  ? Hip Pain  ?  Right hip pain. X1 month  ? ? ?HPI ?Marvin West is a 88 y.o. male.  ? ?HPI 88 year old male presents with right hip pain for 1 month.  PMH significant for chronic back pain, CKD stage III, and HTN. ? ?Past Medical History:  ?Diagnosis Date  ? Arthritis   ? Benign localized prostatic hyperplasia with lower urinary tract symptoms (LUTS)   ? Chronic back pain   ? CKD (chronic kidney disease), stage III ([place])   ? Diverticulosis   ? DJD (degenerative joint disease)   ? ED (erectile dysfunction)   ? GERD (gastroesophageal reflux disease)   ? Gout   ? per last attack over a year 2016 approx.  ? Hemorrhoids   ? Hyperlipidemia   ? Hypertension   ? Hypogonadism in male   ? OSA on CPAP   ? Rectal bleeding   ? Wears glasses   ? ? ?Patient Active Problem List  ? Diagnosis Date Noted  ? HEARTBURN 05/09/2008  ? ABDOMINAL BLOATING 05/09/2008  ? HYPERTENSION 05/05/2008  ? CONSTIPATION 05/05/2008  ? ARTHRITIS 05/05/2008  ? SLEEP APNEA 05/05/2008  ? HERNIORRHAPHY, HX OF 05/05/2008  ? ? ?Past Surgical History:  ?Procedure Laterality Date  ? ANAL FISTULOTOMY  07-04-2003     [facility]  ? and Internal Hemorrhoid Banding  ? CATARACT EXTRACTION W/ INTRAOCULAR LENS  IMPLANT, BILATERAL  2013 approx.  ? COLONOSCOPY WITH PROPOFOL N/A 07/27/2013  ? Procedure: COLONOSCOPY WITH PROPOFOL;  Surgeon: Garlan Fair, MD;  [facility];  Service: Endoscopy;  Laterality: N/A;  ? HEMORRHOID SURGERY N/A 10/02/2017  ? Procedure: HEMORRHOIDECTOMY, Anterior and Posterior;  Surgeon: Leighton Ruff, MD;  Location: [facility];  Service: General;  Laterality: N/A;  ? [place]  03-22-2003     [facility]  ? INGUINAL HERNIA REPAIR Right 08-14-2004    dr Ninfa Linden [facility]  ? LUMBAR DISECTOMY  03/ 2015        [facility])  ? NASAL SINUS SURGERY   07/29/2001  ? THUMB ARTHROTOMY/  REMOVAL MULTIPLE LOOSE BODIES/ SYNVECTOMY/  RESECTION MUCOID CYST Left 01-16-2010    dr sypher  [facility]  ? TOE SURGERY Bilateral   ? TRANSURETHRAL RESECTION OF PROSTATE  1983  ? UVULOPALATOPHARYNGOPLASTY  1993  ? ? ? ? ? ?Home Medications   ? ?Prior to Admission medications   ?Medication Sig Start Date End Date Taking? Authorizing Provider  ?allopurinol (ZYLOPRIM) 300 MG tablet Take 300 mg by mouth daily.   Yes [provider]  ?amLODipine-atorvastatin (CADUET) 5-40 MG per tablet Take 1 tablet by mouth daily.    Yes [provider]  ?clindamycin (CLEOCIN T) 1 % lotion Apply topically every other day.   Yes [provider]  ?diclofenac sodium (VOLTAREN) 1 % GEL Apply topically 4 (four) times daily as needed.   Yes [provider]  ?Diclofenac Sodium 2 % SOLN Place onto the skin every other day.   Yes [provider]  ?fluocinonide cream (LIDEX) 5.91 % Apply 1 application topically 2 (two) times daily as needed.    Yes [provider]  ?gabapentin (NEURONTIN) 300 MG capsule Take 300 mg by mouth every evening.   Yes [provider]  ?hydrochlorothiazide (HYDRODIURIL) 25 MG tablet Take 25 mg  by mouth every morning.   Yes [provider]  ?irbesartan (AVAPRO) 300 MG tablet Take 300 mg by mouth every morning.    Yes [provider]  ?methocarbamol (ROBAXIN) 500 MG tablet Take 500 mg by mouth 2 (two) times daily as needed for muscle spasms.   Yes [provider]  ?metoprolol succinate (TOPROL-XL) 100 MG 24 hr tablet Take 100 mg by mouth daily. Take with or immediately following a meal.   Yes [provider]  ?tadalafil (CIALIS) 20 MG tablet Take 20 mg by mouth daily as needed for erectile dysfunction.    Yes [provider]  ?tamsulosin (FLOMAX) 0.4 MG CAPS capsule Take 1 capsule (0.4 mg total) by mouth daily. 82/42/35  Yes Leighton Ruff, MD  ?testosterone cypionate (DEPOTESTOTERONE  CYPIONATE) 200 MG/ML injection Inject 200 mg into the muscle every 14 (fourteen) days.    Yes [provider]  ?solifenacin (VESICARE) 5 MG tablet 1 tablet    [provider]  ?traMADol (ULTRAM) 50 MG tablet Take 1 tablet (50 mg total) by mouth daily as needed. 04/14/22  Yes Eliezer Lofts, FNP  ? ? ?Family History ?History reviewed. No pertinent family history. ? ?Social History ?Social History  ? ?Tobacco Use  ? Smoking status: Never  ? Smokeless tobacco: Never  ?Vaping Use  ? Vaping Use: Never used  ?Substance Use Topics  ? Alcohol use: Yes  ?  Comment: socially  ? Drug use: No  ? ? ? ?Allergies   ?Axiron [testosterone], Doxazosin mesylate, Lisinopril, Norvasc [amlodipine besylate], and Sulfa antibiotics ? ? ?Review of Systems ?Review of Systems  ?Musculoskeletal:   ?     Right hip pain x1 month  ? ? ?Physical Exam ?Triage Vital Signs ?ED Triage Vitals  ?Enc Vitals Group  ?   BP 04/14/22 1155 (!) 147/73  ?   Pulse Rate 04/14/22 1155 (!) 53  ?   Resp 04/14/22 1155 18  ?   Temp 04/14/22 1155 97.9 ?F (36.6 ?C)  ?   Temp Source 04/14/22 1155 Oral  ?   SpO2 04/14/22 1155 97 %  ?   Weight 04/14/22 1151 167 lb (75.8 kg)  ?   Height 04/14/22 1151 5' 9.5" (1.765 m)  ?   Head Circumference --   ?   Peak Flow --   ?   Pain Score 04/14/22 1151 10  ?   Pain Loc --   ?   Pain Edu? --   ?   Excl. in [place]? --   ? ?No data found. ? ?Updated Vital Signs ?BP (!) 147/73 (BP Location: Left Arm)   Pulse (!) 53   Temp 97.9 ?F (36.6 ?C) (Oral)   Resp 18   Ht 5' 9.5" (1.765 m)   Wt 167 lb (75.8 kg)   SpO2 97%   BMI 24.31 kg/m?  ? ?  ? ?Physical Exam ?Vitals and nursing note reviewed.  ?Constitutional:   ?   Appearance: Normal appearance. He is normal weight.  ?HENT:  ?   Head: Normocephalic and atraumatic.  ?   Mouth/Throat:  ?   Mouth: Mucous membranes are moist.  ?   Pharynx: Oropharynx is clear.  ?Eyes:  ?   Extraocular Movements: Extraocular movements intact.  ?   Conjunctiva/sclera: Conjunctivae normal.  ?    Pupils: Pupils are equal, round, and reactive to light.  ?Cardiovascular:  ?   Rate and Rhythm: Normal rate and regular rhythm.  ?   Pulses: Normal  pulses.  ?   Heart sounds: Normal heart sounds.  ?Pulmonary:  ?   Effort: Pulmonary effort is normal.  ?   Breath sounds: Normal breath sounds. No wheezing, rhonchi or rales.  ?Musculoskeletal:  ?   Cervical back: Normal range of motion and neck supple.  ?   Comments: Right hip: TTP over entire hip joint, LROM with abduction/abduction  ?Skin: ?   General: Skin is warm and dry.  ?Neurological:  ?   Mental Status: He is alert.  ? ? ? ?UC Treatments / Results  ?Labs ?(all labs ordered are listed, but only abnormal results are displayed) ?Labs Reviewed - No data to display ? ?EKG ? ? ?Radiology ?DG Lumbar Spine Complete ? ?Result Date: 04/14/2022 ?CLINICAL DATA:  Lower back pain radiating down the right leg. Right hip pain. EXAM: LUMBAR SPINE - COMPLETE 4+ VIEW COMPARISON:  Lumbar spine radiographs 02/27/2022 FINDINGS: There are 5 non-rib-bearing lumbar-type vertebral bodies. Minimal levocurvature centered at L3-4. Severe L4-5 and L5-S1 disc space narrowing and endplate sclerosis. Unchanged 7 mm grade 1 anterolisthesis of L4 on L5. Vertebral body heights are maintained. Calcifications are again seen within the abdominal aorta. IMPRESSION: No significant change from prior. Grade 1 anterolisthesis of L4 on L5. Severe degenerative disc changes at L4-5 and L5-S1. Electronically Signed   By: Yvonne Kendall M.D.   On: 04/14/2022 13:30  ? ?DG Hip Unilat W or Wo Pelvis 2-3 Views Right ? ?Result Date: 04/14/2022 ?CLINICAL DATA:  Right hip pain for 1 month.  No known injury. EXAM: DG HIP (WITH OR WITHOUT PELVIS) 2-3V RIGHT COMPARISON:  Pelvis and right hip radiographs 02/27/2022 FINDINGS: Severe bilateral superomedial femoroacetabular joint space narrowing. Mild right femoral head-neck junction and mild bilateral superolateral acetabular degenerative osteophytes. Mild bilateral  sacroiliac joint subchondral sclerosis. Mild superior pubic symphysis degenerative spurring. No acute fracture is seen. No dislocation. IMPRESSION: Severe bilateral femoroacetabular osteoarthritis. No acute fracture. Electronic

## 2022-04-14 NOTE — Discharge Instructions (Addendum)
Advised/informed patient of right hip and lumbar spine x-ray results hard copy provided.  Advised patient may use Tramadol daily, as needed for breakthrough pain of right hip and lower back.  Advised patient if symptoms worsen and/or unresolved please follow-up with PCP and/or nephrology for further evaluation. ?

## 2022-04-14 NOTE — ED Triage Notes (Signed)
Pt states that he has some right hip pain. X1 month.  ?

## 2022-04-15 DIAGNOSIS — R600 Localized edema: Secondary | ICD-10-CM | POA: Diagnosis not present

## 2022-04-15 DIAGNOSIS — Z113 Encounter for screening for infections with a predominantly sexual mode of transmission: Secondary | ICD-10-CM | POA: Diagnosis not present

## 2022-04-15 DIAGNOSIS — M160 Bilateral primary osteoarthritis of hip: Secondary | ICD-10-CM | POA: Diagnosis not present

## 2022-04-15 DIAGNOSIS — G4733 Obstructive sleep apnea (adult) (pediatric): Secondary | ICD-10-CM | POA: Diagnosis not present

## 2022-04-15 DIAGNOSIS — N183 Chronic kidney disease, stage 3 unspecified: Secondary | ICD-10-CM | POA: Diagnosis not present

## 2022-04-15 DIAGNOSIS — E291 Testicular hypofunction: Secondary | ICD-10-CM | POA: Diagnosis not present

## 2022-04-29 DIAGNOSIS — Z20822 Contact with and (suspected) exposure to covid-19: Secondary | ICD-10-CM | POA: Diagnosis not present

## 2022-04-29 DIAGNOSIS — E291 Testicular hypofunction: Secondary | ICD-10-CM | POA: Diagnosis not present

## 2022-04-30 ENCOUNTER — Encounter: Payer: Self-pay | Admitting: Physical Therapy

## 2022-04-30 ENCOUNTER — Ambulatory Visit: Payer: Medicare Other | Attending: Internal Medicine | Admitting: Physical Therapy

## 2022-04-30 DIAGNOSIS — G8929 Other chronic pain: Secondary | ICD-10-CM | POA: Insufficient documentation

## 2022-04-30 DIAGNOSIS — M6281 Muscle weakness (generalized): Secondary | ICD-10-CM | POA: Diagnosis not present

## 2022-04-30 DIAGNOSIS — R252 Cramp and spasm: Secondary | ICD-10-CM | POA: Diagnosis not present

## 2022-04-30 DIAGNOSIS — M25552 Pain in left hip: Secondary | ICD-10-CM | POA: Insufficient documentation

## 2022-04-30 DIAGNOSIS — R2689 Other abnormalities of gait and mobility: Secondary | ICD-10-CM | POA: Insufficient documentation

## 2022-04-30 DIAGNOSIS — M545 Low back pain, unspecified: Secondary | ICD-10-CM | POA: Insufficient documentation

## 2022-04-30 DIAGNOSIS — R262 Difficulty in walking, not elsewhere classified: Secondary | ICD-10-CM | POA: Insufficient documentation

## 2022-04-30 DIAGNOSIS — M25551 Pain in right hip: Secondary | ICD-10-CM | POA: Insufficient documentation

## 2022-04-30 NOTE — Patient Instructions (Signed)

## 2022-04-30 NOTE — Therapy (Signed)
[place] [address] ?[place], [place], [address] ?Phone: [phone]   Fax:  [phone] ? ?Physical Therapy Evaluation ? ?Patient Details  ?Name: Marvin West ?MRN: [id number] ?Date of Birth: [birth year] ?Referring Provider (PT): Okey Dupre ? ? ?Encounter Date: 04/30/2022 ? ? PT End of Session - 04/30/22 0853   ? ? Visit Number 1   ? Number of Visits 17   ? Date for PT Re-Evaluation 06/25/22   ? Authorization Type Medicare and NALC   ? Authorization Time Period 04/30/22 to 06/25/22   ? Progress Note Due on Visit 10   ? PT Start Time 0801   ? PT Stop Time 0837   DN not included in billing  ? PT Time Calculation (min) 36 min   ? Activity Tolerance Patient tolerated treatment well   ? Behavior During Therapy [facility] for tasks assessed/performed   ? ?  ?  ? ?  ? ? ?Past Medical History:  ?Diagnosis Date  ? Arthritis   ? Benign localized prostatic hyperplasia with lower urinary tract symptoms (LUTS)   ? Chronic back pain   ? CKD (chronic kidney disease), stage III ([place])   ? Diverticulosis   ? DJD (degenerative joint disease)   ? ED (erectile dysfunction)   ? GERD (gastroesophageal reflux disease)   ? Gout   ? per last attack over a year 2016 approx.  ? Hemorrhoids   ? Hyperlipidemia   ? Hypertension   ? Hypogonadism in male   ? OSA on CPAP   ? Rectal bleeding   ? Wears glasses   ? ? ?Past Surgical History:  ?Procedure Laterality Date  ? ANAL FISTULOTOMY  07-04-2003     [facility]  ? and Internal Hemorrhoid Banding  ? CATARACT EXTRACTION W/ INTRAOCULAR LENS  IMPLANT, BILATERAL  2013 approx.  ? COLONOSCOPY WITH PROPOFOL N/A 07/27/2013  ? Procedure: COLONOSCOPY WITH PROPOFOL;  Surgeon: Garlan Fair, MD;  [facility];  Service: Endoscopy;  Laterality: N/A;  ? HEMORRHOID SURGERY N/A 10/02/2017  ? Procedure: HEMORRHOIDECTOMY, Anterior and Posterior;  Surgeon: Leighton Ruff, MD;  Location: [facility];  Service: General;  Laterality: N/A;  ?  [place]  03-22-2003     [facility]  ? INGUINAL HERNIA REPAIR Right 08-14-2004    dr Ninfa Linden [facility]  ? LUMBAR DISECTOMY  03/ 2015        [facility])  ? NASAL SINUS SURGERY  07/29/2001  ? THUMB ARTHROTOMY/  REMOVAL MULTIPLE LOOSE BODIES/ SYNVECTOMY/  RESECTION MUCOID CYST Left 01-16-2010    dr sypher  [facility]  ? TOE SURGERY Bilateral   ? TRANSURETHRAL RESECTION OF PROSTATE  1983  ? UVULOPALATOPHARYNGOPLASTY  1993  ? ? ?There were no vitals filed for this visit. ? ? ? Subjective Assessment - 04/30/22 0803   ? ? Subjective My hips got bad a month ago, i was at the gym on the stepper machine and my hips hurt. The right hip is worse than the left. No hip issues before this. My back is not holding up at all, it bothers me too the pain starts high and goes all the way down my right leg on the side. I just noticed numbness and tingling in the right leg. I've had a couple falls, tripped on a rug and fell backwards planting plants and hit my head.   ? Patient Stated Goals get rid of pain, work on balance   ? Currently in  Pain? Yes   ? Pain Score 7    ? Pain Location Hip   ? Pain Orientation Right;Left   ? Pain Descriptors / Indicators Throbbing;Sharp   ? Pain Type Chronic pain   ? Pain Radiating Towards down side of R leg   ? Pain Onset 1 to 4 weeks ago   ? Pain Frequency Constant   ? Aggravating Factors  first getting up in the morning, golf   ? Pain Relieving Factors aspirin, pain meds   ? Effect of Pain on Daily Activities severe   ? ?  ?  ? ?  ? ? ? ? ? [facility] PT Assessment - 04/30/22 0001   ? ?  ? Assessment  ? Medical Diagnosis hip OA   ? Referring Provider (PT) Okey Dupre   ? Onset Date/Surgical Date --   about a month ago  ? Next MD Visit unsure   ? Prior Therapy PT here in the past for back and neck   ?  ? Precautions  ? Precautions Fall   ?  ? Restrictions  ? Weight Bearing Restrictions No   ?  ? Balance Screen  ? Has the patient fallen in the past 6 months Yes   ? How many times? 4- trips  on items like rugs and pots   ? Has the patient had a decrease in activity level because of a fear of falling?  Yes   ? Is the patient reluctant to leave their home because of a fear of falling?  No   ?  ? Home Environment  ? Living Environment Private residence   ?  ? Prior Function  ? Level of Independence Independent;Independent with basic ADLs;Independent with gait;Independent with transfers   ? Vocation Retired   ? Leisure golf   ?  ? Observation/Other Assessments  ? Observations SLR (-) B- just tight, no sharp or radiating pains   ?  ? ROM / Strength  ? AROM / PROM / Strength Strength;AROM   ?  ? AROM  ? AROM Assessment Site Lumbar   ? Lumbar Flexion severe limitation   ? Lumbar Extension severe limitation   ? Lumbar - Right Side Bend severe limitation   ? Lumbar - Left Side Bend severe limitation   ?  ? Strength  ? Strength Assessment Site Hip;Knee;Ankle   ? Right/Left Hip Right;Left   ? Right Hip Flexion 4/5   ? Right Hip Extension 3-/5   ? Right Hip ABduction 4/5   ? Left Hip Flexion 4+/5   ? Left Hip Extension 3-/5   ? Left Hip ABduction 4/5   ? Right/Left Knee Right;Left   ? Right Knee Flexion 4/5   ? Right Knee Extension 4+/5   ? Left Knee Flexion 4/5   ? Left Knee Extension 4+/5   ? Right/Left Ankle Right;Left   ? Right Ankle Dorsiflexion 5/5   ? Left Ankle Dorsiflexion 5/5   ?  ? Flexibility  ? Soft Tissue Assessment /Muscle Length yes   ? Hamstrings moderate limitation B   ? Piriformis L hip severe limitation, R hip mild limitation   ?  ? Palpation  ? Palpation comment mm tightness and spasms noted paraspinals and glutes R>L; lateral thigh and calf mm in spasm   ? ?  ?  ? ?  ? ? ? ? ? ? ? ? ? ? ? ? ? ?Objective measurements completed on examination: See above findings.  ? ? ? ? ? ? ?  Trigger Point Dry Needling - 04/30/22 0001   ? ? Consent Given? Yes   ? Education Handout Provided Yes   ? Muscles Treated Back/Hip Gluteus maximus;Erector spinae   ? Gluteus Maximus Response Twitch response  elicited;Palpable increased muscle length   ? Erector spinae Response Palpable increased muscle length;Twitch response elicited   ? ?  ?  ? ?  ? ? ? ? ? ? ? ? PT Education - 04/30/22 0853   ? ? Education Details exam findings, POC, HEP, DN education   ? Person(s) Educated Patient   ? Methods Explanation;Demonstration;Handout   ? Comprehension Verbalized understanding;Returned demonstration   ? ?  ?  ? ?  ? ? ? PT Short Term Goals - 04/30/22 0856   ? ?  ? PT SHORT TERM GOAL #1  ? Title Will be independent with appropriate progressive HEP   ? Time 4   ? Period Weeks   ? Status New   ? Target Date 05/28/22   ?  ? PT SHORT TERM GOAL #2  ? Title Will have better understanding of postural mechanics   ? Time 4   ? Period Weeks   ? Status New   ?  ? PT SHORT TERM GOAL #3  ? Title General ROM in low back and B hips to improve by 50% to show improved motion and to help reduce pain   ? Time 4   ? Period Weeks   ? Status New   ?  ? PT SHORT TERM GOAL #4  ? Title Will be able to maintain tandem stance for at least 20 seconds on foam to show improving balance skills   ? Time 4   ? Period Weeks   ? Status New   ? ?  ?  ? ?  ? ? ? ? PT Long Term Goals - 04/30/22 0858   ? ?  ? PT LONG TERM GOAL #1  ? Title MMT to be 5/5 globally   ? Time 8   ? Period Weeks   ? Status New   ? Target Date 06/25/22   ?  ? PT LONG TERM GOAL #2  ? Title Will score at least 50 on the Berg to show reduced fall risk   ? Time 8   ? Period Weeks   ? Status New   ?  ? PT LONG TERM GOAL #3  ? Title Pain to be no more than 4/10   ? Time 8   ? Period Weeks   ? Status New   ?  ? PT LONG TERM GOAL #4  ? Title Will be independent in appropriate gym based program   ? Time 8   ? Period Weeks   ? Status New   ? ?  ?  ? ?  ? ? ? ? ? ? ? ? ? Plan - 04/30/22 0854   ? ? Clinical Impression Statement Mr. Barcellos arrives today doing OK, just having a lot of pain since hurting his hips on the stepper machine about a month ago. Really interested in dry needling as this helped him  a lot last time. Exam reveals severe lumbar and bilateral hip stiffness, functional muscle weakness, postural impairment and severe muscle spasms in low back and LEs. Had DN certified clinician dry needle some trigge

## 2022-05-03 ENCOUNTER — Ambulatory Visit: Payer: Medicare Other | Admitting: Physical Therapy

## 2022-05-03 ENCOUNTER — Encounter: Payer: Self-pay | Admitting: Physical Therapy

## 2022-05-03 DIAGNOSIS — R262 Difficulty in walking, not elsewhere classified: Secondary | ICD-10-CM | POA: Diagnosis not present

## 2022-05-03 DIAGNOSIS — R2689 Other abnormalities of gait and mobility: Secondary | ICD-10-CM | POA: Diagnosis not present

## 2022-05-03 DIAGNOSIS — M545 Low back pain, unspecified: Secondary | ICD-10-CM | POA: Diagnosis not present

## 2022-05-03 DIAGNOSIS — M25552 Pain in left hip: Secondary | ICD-10-CM

## 2022-05-03 DIAGNOSIS — R252 Cramp and spasm: Secondary | ICD-10-CM

## 2022-05-03 DIAGNOSIS — M6281 Muscle weakness (generalized): Secondary | ICD-10-CM | POA: Diagnosis not present

## 2022-05-03 DIAGNOSIS — G8929 Other chronic pain: Secondary | ICD-10-CM | POA: Diagnosis not present

## 2022-05-03 NOTE — Therapy (Signed)
[place] [address] ?[place], [place], [address] ?Phone: [phone]   Fax:  [phone] ? ?Physical Therapy Treatment ? ?Patient Details  ?Name: Marvin West ?MRN: [id number] ?Date of Birth: 1934/11/25 ?Referring Provider (PT): Okey Dupre ? ? ?Encounter Date: 05/03/2022 ? ? PT End of Session - 05/03/22 1142   ? ? Visit Number 2   ? Date for PT Re-Evaluation 06/25/22   ? Authorization Type Medicare and NALC   ? PT Start Time 1110   ? PT Stop Time 5361   ? PT Time Calculation (min) 32 min   ? Activity Tolerance Patient tolerated treatment well   ? Behavior During Therapy [facility] for tasks assessed/performed   ? ?  ?  ? ?  ? ? ?Past Medical History:  ?Diagnosis Date  ? Arthritis   ? Benign localized prostatic hyperplasia with lower urinary tract symptoms (LUTS)   ? Chronic back pain   ? CKD (chronic kidney disease), stage III ([place])   ? Diverticulosis   ? DJD (degenerative joint disease)   ? ED (erectile dysfunction)   ? GERD (gastroesophageal reflux disease)   ? Gout   ? per last attack over a year 2016 approx.  ? Hemorrhoids   ? Hyperlipidemia   ? Hypertension   ? Hypogonadism in male   ? OSA on CPAP   ? Rectal bleeding   ? Wears glasses   ? ? ?Past Surgical History:  ?Procedure Laterality Date  ? ANAL FISTULOTOMY  07-04-2003     [facility]  ? and Internal Hemorrhoid Banding  ? CATARACT EXTRACTION W/ INTRAOCULAR LENS  IMPLANT, BILATERAL  2013 approx.  ? COLONOSCOPY WITH PROPOFOL N/A 07/27/2013  ? Procedure: COLONOSCOPY WITH PROPOFOL;  Surgeon: Garlan Fair, MD;  [facility];  Service: Endoscopy;  Laterality: N/A;  ? HEMORRHOID SURGERY N/A 10/02/2017  ? Procedure: HEMORRHOIDECTOMY, Anterior and Posterior;  Surgeon: Leighton Ruff, MD;  Location: [facility];  Service: General;  Laterality: N/A;  ? [place]  03-22-2003     [facility]  ? INGUINAL HERNIA REPAIR Right 08-14-2004    dr Ninfa Linden [facility]  ? LUMBAR  DISECTOMY  03/ 2015        [facility])  ? NASAL SINUS SURGERY  07/29/2001  ? THUMB ARTHROTOMY/  REMOVAL MULTIPLE LOOSE BODIES/ SYNVECTOMY/  RESECTION MUCOID CYST Left 01-16-2010    dr sypher  [facility]  ? TOE SURGERY Bilateral   ? TRANSURETHRAL RESECTION OF PROSTATE  1983  ? UVULOPALATOPHARYNGOPLASTY  1993  ? ? ?There were no vitals filed for this visit. ? ? Subjective Assessment - 05/03/22 1111   ? ? Subjective R leg is worst today, starts from his ankle up to his back.   ? Currently in Pain? Yes   ? Pain Score 10-Worst pain ever   ? Pain Location Leg   ? Pain Orientation Right   ? ?  ?  ? ?  ? ? ? ? ? ? ? ? ? ? ? ? ? ? ? ? ? ? ? ? [facility] Adult PT Treatment/Exercise - 05/03/22 0001   ? ?  ? Exercises  ? Exercises Lumbar   ?  ? Lumbar Exercises: Stretches  ? Passive Hamstring Stretch 4 reps;Left;Right;10 seconds   ? Single Knee to Chest Stretch 4 reps;Left;Right;10 seconds;20 seconds   ? Double Knee to Chest Stretch 3 reps;10 seconds;20 seconds   ? Lower Trunk Rotation 3 reps;20 seconds;10 seconds   ?  ?  Lumbar Exercises: Aerobic  ? Nustep L5 x 6 min   ?  ? Lumbar Exercises: Machines for Strengthening  ? Cybex Knee Extension 5lb 2x10   ? Cybex Knee Flexion 20lb 2x10   ? ?  ?  ? ?  ? ? ? ? ? ? ? ? ? ? ? ? PT Short Term Goals - 04/30/22 0856   ? ?  ? PT SHORT TERM GOAL #1  ? Title Will be independent with appropriate progressive HEP   ? Time 4   ? Period Weeks   ? Status New   ? Target Date 05/28/22   ?  ? PT SHORT TERM GOAL #2  ? Title Will have better understanding of postural mechanics   ? Time 4   ? Period Weeks   ? Status New   ?  ? PT SHORT TERM GOAL #3  ? Title General ROM in low back and B hips to improve by 50% to show improved motion and to help reduce pain   ? Time 4   ? Period Weeks   ? Status New   ?  ? PT SHORT TERM GOAL #4  ? Title Will be able to maintain tandem stance for at least 20 seconds on foam to show improving balance skills   ? Time 4   ? Period Weeks   ? Status New   ? ?  ?  ? ?  ? ? ? ? PT Long Term  Goals - 04/30/22 0858   ? ?  ? PT LONG TERM GOAL #1  ? Title MMT to be 5/5 globally   ? Time 8   ? Period Weeks   ? Status New   ? Target Date 06/25/22   ?  ? PT LONG TERM GOAL #2  ? Title Will score at least 50 on the Berg to show reduced fall risk   ? Time 8   ? Period Weeks   ? Status New   ?  ? PT LONG TERM GOAL #3  ? Title Pain to be no more than 4/10   ? Time 8   ? Period Weeks   ? Status New   ?  ? PT LONG TERM GOAL #4  ? Title Will be independent in appropriate gym based program   ? Time 8   ? Period Weeks   ? Status New   ? ?  ?  ? ?  ? ? ? ? ? ? ? ? Plan - 05/03/22 1143   ? ? Clinical Impression Statement Pt enters clinic with increase R leg pain that went into his ow back. He was able to ambulate in clinic but needed WC to reach the gym. Pt did well on NuStep and with seated curls and extensions. Bilateral LE and low back tightness present with passive stretching. Pt's cane is lock in a position that is too long for him.   ? Personal Factors and Comorbidities Age;Fitness;Time since onset of injury/illness/exacerbation   ? Examination-Activity Limitations Locomotion Level;Transfers;Bend;Sit;Squat;Stairs;Stand;Lift   ? Examination-Participation Restrictions Community Activity;Shop;Laundry;Valla Leaver Work   ? Stability/Clinical Decision Making Stable/Uncomplicated   ? Rehab Potential Good   ? PT Frequency 2x / week   ? PT Duration 8 weeks   ? PT Treatment/Interventions ADLs/Self Care Home Management;Cryotherapy;Electrical Stimulation;Iontophoresis '4mg'$ /ml Dexamethasone;Moist Heat;Traction;Ultrasound;DME Instruction;Gait training;Stair training;Functional mobility training;Therapeutic activities;Therapeutic exercise;Balance training;Neuromuscular re-education;Patient/family education;Manual techniques;Passive range of motion;Dry needling;Energy conservation;Taping   ? PT Next Visit Plan how was DN? need to work on  general mobility, flexibility and strength. Needs Merrilee Jansky   ? ?  ?  ? ?  ? ? ?Patient will benefit from  skilled therapeutic intervention in order to improve the following deficits and impairments:  Abnormal gait, Decreased range of motion, Difficulty walking, Increased fascial restricitons, Increased muscle spasms, Decreased activity tolerance, Pain, Decreased balance, Impaired flexibility, Improper body mechanics, Decreased mobility, Decreased strength, Postural dysfunction ? ?Visit Diagnosis: ?Muscle weakness (generalized) ? ?Cramp and spasm ? ?Chronic low back pain, unspecified back pain laterality, unspecified whether sciatica present ? ?Balance disorder ? ?Pain in left hip ? ?Difficulty in walking, not elsewhere classified ? ? ? ? ?Problem List ?Patient Active Problem List  ? Diagnosis Date Noted  ? HEARTBURN 05/09/2008  ? ABDOMINAL BLOATING 05/09/2008  ? HYPERTENSION 05/05/2008  ? CONSTIPATION 05/05/2008  ? ARTHRITIS 05/05/2008  ? SLEEP APNEA 05/05/2008  ? HERNIORRHAPHY, HX OF 05/05/2008  ? ? ?Scot Jun, PTA ?05/03/2022, 11:46 AM ? ?[place] [address] ?[place], [place], [address] ?Phone: [phone]   Fax:  [phone] ? ?Name: SHARRON SIMPSON ?MRN: [id number] ?Date of Birth: 1934/10/02 ? ? ? ?

## 2022-05-07 ENCOUNTER — Encounter: Payer: Self-pay | Admitting: Physical Therapy

## 2022-05-07 ENCOUNTER — Ambulatory Visit: Payer: Medicare Other | Admitting: Physical Therapy

## 2022-05-07 DIAGNOSIS — G8929 Other chronic pain: Secondary | ICD-10-CM

## 2022-05-07 DIAGNOSIS — M6281 Muscle weakness (generalized): Secondary | ICD-10-CM

## 2022-05-07 DIAGNOSIS — R262 Difficulty in walking, not elsewhere classified: Secondary | ICD-10-CM | POA: Diagnosis not present

## 2022-05-07 DIAGNOSIS — R252 Cramp and spasm: Secondary | ICD-10-CM

## 2022-05-07 DIAGNOSIS — E291 Testicular hypofunction: Secondary | ICD-10-CM | POA: Diagnosis not present

## 2022-05-07 DIAGNOSIS — M545 Low back pain, unspecified: Secondary | ICD-10-CM | POA: Diagnosis not present

## 2022-05-07 DIAGNOSIS — R2689 Other abnormalities of gait and mobility: Secondary | ICD-10-CM | POA: Diagnosis not present

## 2022-05-07 NOTE — Therapy (Signed)
?OUTPATIENT PHYSICAL THERAPY TREATMENT NOTE ? ? ?Patient Name: Marvin West ?MRN: [id number] ?DOB:May 06, 1934, 88 y.o., male ?Today's Date: 05/07/2022 ? ?PCP: Delia Chimes ?REFERRING PROVIDER: Okey Dupre A ? ? PT End of Session - 05/07/22 1152   ? ? Visit Number 3   ? Date for PT Re-Evaluation 06/25/22   ? PT Start Time 1153   ? PT Stop Time 1230   ? PT Time Calculation (min) 37 min   ? ?  ?  ? ?  ? ? ?Past Medical History:  ?Diagnosis Date  ? Arthritis   ? Benign localized prostatic hyperplasia with lower urinary tract symptoms (LUTS)   ? Chronic back pain   ? CKD (chronic kidney disease), stage III ([place])   ? Diverticulosis   ? DJD (degenerative joint disease)   ? ED (erectile dysfunction)   ? GERD (gastroesophageal reflux disease)   ? Gout   ? per last attack over a year 2016 approx.  ? Hemorrhoids   ? Hyperlipidemia   ? Hypertension   ? Hypogonadism in male   ? OSA on CPAP   ? Rectal bleeding   ? Wears glasses   ? ?Past Surgical History:  ?Procedure Laterality Date  ? ANAL FISTULOTOMY  07-04-2003     [facility]  ? and Internal Hemorrhoid Banding  ? CATARACT EXTRACTION W/ INTRAOCULAR LENS  IMPLANT, BILATERAL  2013 approx.  ? COLONOSCOPY WITH PROPOFOL N/A 07/27/2013  ? Procedure: COLONOSCOPY WITH PROPOFOL;  Surgeon: Garlan Fair, MD;  [facility];  Service: Endoscopy;  Laterality: N/A;  ? HEMORRHOID SURGERY N/A 10/02/2017  ? Procedure: HEMORRHOIDECTOMY, Anterior and Posterior;  Surgeon: Leighton Ruff, MD;  Location: [facility];  Service: General;  Laterality: N/A;  ? [place]  03-22-2003     [facility]  ? INGUINAL HERNIA REPAIR Right 08-14-2004    dr Ninfa Linden [facility]  ? LUMBAR DISECTOMY  03/ 2015        [facility])  ? NASAL SINUS SURGERY  07/29/2001  ? THUMB ARTHROTOMY/  REMOVAL MULTIPLE LOOSE BODIES/ SYNVECTOMY/  RESECTION MUCOID CYST Left 01-16-2010    dr sypher  [facility]  ? TOE SURGERY Bilateral   ? TRANSURETHRAL RESECTION OF PROSTATE  1983  ?  UVULOPALATOPHARYNGOPLASTY  1993  ? ?Patient Active Problem List  ? Diagnosis Date Noted  ? HEARTBURN 05/09/2008  ? ABDOMINAL BLOATING 05/09/2008  ? HYPERTENSION 05/05/2008  ? CONSTIPATION 05/05/2008  ? ARTHRITIS 05/05/2008  ? SLEEP APNEA 05/05/2008  ? HERNIORRHAPHY, HX OF 05/05/2008  ? ? ?REFERRING DIAG: M15.9 (ICD-10-CM) - Polyosteoarthritis, unspecified ? ?THERAPY DIAG:  ?Muscle weakness (generalized) ? ?Cramp and spasm ? ?Chronic low back pain, unspecified back pain laterality, unspecified whether sciatica present ? ?PERTINENT HISTORY: Low back pain ? ?PRECAUTIONS: N/A ? ?SUBJECTIVE: I dont feel too great, once he gets up and moves pain improves ? ?PAIN:  ?Are you having pain? Yes: NPRS scale: 8/10 ?Pain location: low back hip and legs R side  ?Pain description: pain ?Aggravating factors: when he gets up ?Relieving factors: walking around ? ? ? ? ?TODAY'S TREATMENT:  ?  Modalities: MHP to pt low back, hip, Leg  ?  Dry Needling to the lumbar area right side, right hip and lateral thigh ?  STM to the above ? ? ?PATIENT EDUCATION: ?Education details: Avoid prolong sitting, try to get up every 30 min  ?Person educated: Patient ?Education method: Explanation ?Education comprehension: verbalized understanding ? ? ?HOME EXERCISE PROGRAM: ?[id number] ? ? PT  Short Term Goals   ? ?  ? PT SHORT TERM GOAL #1  ? Title Will be independent with appropriate progressive HEP   ? Time 4   ? Period Weeks   ? Status New   ? Target Date 05/28/22   ?  ? PT SHORT TERM GOAL #2  ? Title Will have better understanding of postural mechanics   ? Time 4   ? Period Weeks   ? Status New   ?  ? PT SHORT TERM GOAL #3  ? Title General ROM in low back and B hips to improve by 50% to show improved motion and to help reduce pain   ? Time 4   ? Period Weeks   ? Status New   ?  ? PT SHORT TERM GOAL #4  ? Title Will be able to maintain tandem stance for at least 20 seconds on foam to show improving balance skills   ? Time 4   ? Period Weeks   ? Status New    ? ?  ?  ? ?  ? ? ? PT Long Term Goals   ? ?  ? PT LONG TERM GOAL #1  ? Title MMT to be 5/5 globally   ? Time 8   ? Period Weeks   ? Status New   ? Target Date 06/25/22   ?  ? PT LONG TERM GOAL #2  ? Title Will score at least 50 on the Berg to show reduced fall risk   ? Time 8   ? Period Weeks   ? Status New   ?  ? PT LONG TERM GOAL #3  ? Title Pain to be no more than 4/10   ? Time 8   ? Period Weeks   ? Status New   ?  ? PT LONG TERM GOAL #4  ? Title Will be independent in appropriate gym based program   ? Time 8   ? Period Weeks   ? Status New   ? ?  ?  ? ?  ? ? ? Plan   ? ? Clinical Impression Statement Pt ` 8 minutes late for today's session.Pt enters clinic ambulating without SPC, reporting increase pain on R side. Pt enter requesting DN. Lead PT assisted in treatment session providing pt with DN. Modalities applied to areas of DN to aid with soreness. Positive response to treatment session  ? Personal Factors and Comorbidities Age;Fitness;Time since onset of injury/illness/exacerbation   ? Examination-Activity Limitations Locomotion Level;Transfers;Bend;Sit;Squat;Stairs;Stand;Lift   ? Examination-Participation Restrictions Community Activity;Shop;Laundry;Valla Leaver Work   ? Rehab Potential Good   ? PT Frequency 2x / week   ? PT Duration 8 weeks   ? PT Treatment/Interventions ADLs/Self Care Home Management;Cryotherapy;Electrical Stimulation;Iontophoresis '4mg'$ /ml Dexamethasone;Moist Heat;Traction;Ultrasound;DME Instruction;Gait training;Stair training;Functional mobility training;Therapeutic activities;Therapeutic exercise;Balance training;Neuromuscular re-education;Patient/family education;Manual techniques;Passive range of motion;Dry needling;Energy conservation;Taping   ? PT Next Visit Plan how was DN? need to work on general mobility, flexibility and strength. Needs Merrilee Jansky   ? ?  ?  ? ?  ? ? ? ? ?Sumner Boast, PT ?05/07/2022, 12:31 PM ? ?   ?

## 2022-05-10 ENCOUNTER — Ambulatory Visit: Payer: Medicare Other | Admitting: Physical Therapy

## 2022-05-14 ENCOUNTER — Ambulatory Visit: Payer: Medicare Other | Admitting: Physical Therapy

## 2022-05-14 DIAGNOSIS — R262 Difficulty in walking, not elsewhere classified: Secondary | ICD-10-CM | POA: Diagnosis not present

## 2022-05-14 DIAGNOSIS — R2689 Other abnormalities of gait and mobility: Secondary | ICD-10-CM | POA: Diagnosis not present

## 2022-05-14 DIAGNOSIS — R252 Cramp and spasm: Secondary | ICD-10-CM

## 2022-05-14 DIAGNOSIS — G8929 Other chronic pain: Secondary | ICD-10-CM | POA: Diagnosis not present

## 2022-05-14 DIAGNOSIS — M6281 Muscle weakness (generalized): Secondary | ICD-10-CM | POA: Diagnosis not present

## 2022-05-14 DIAGNOSIS — M545 Low back pain, unspecified: Secondary | ICD-10-CM | POA: Diagnosis not present

## 2022-05-14 NOTE — Therapy (Signed)
OUTPATIENT PHYSICAL THERAPY TREATMENT NOTE   Patient Name: Marvin West MRN: [id number] DOB:1934-01-27, 88 y.o., male Today's Date: 05/14/2022  PCP: Delia Chimes REFERRING PROVIDER: Kathalene Frames   PT End of Session - 05/14/22 1241     Visit Number 4    Date for PT Re-Evaluation 06/25/22    Authorization Type Medicare and [place]    Authorization Time Period 04/30/22 to 06/25/22    PT Start Time 1225    PT Stop Time 1315    PT Time Calculation (min) 50 min             Past Medical History:  Diagnosis Date   Arthritis    Benign localized prostatic hyperplasia with lower urinary tract symptoms (LUTS)    Chronic back pain    CKD (chronic kidney disease), stage III (HCC)    Diverticulosis    DJD (degenerative joint disease)    ED (erectile dysfunction)    GERD (gastroesophageal reflux disease)    Gout    per last attack over a year 2016 approx.   Hemorrhoids    Hyperlipidemia    Hypertension    Hypogonadism in male    OSA on CPAP    Rectal bleeding    Wears glasses    Past Surgical History:  Procedure Laterality Date   ANAL FISTULOTOMY  07-04-2003     [facility]   and Internal Hemorrhoid Banding   CATARACT EXTRACTION W/ INTRAOCULAR LENS  IMPLANT, BILATERAL  2013 approx.   COLONOSCOPY WITH PROPOFOL N/A 07/27/2013   Procedure: COLONOSCOPY WITH PROPOFOL;  Surgeon: Garlan Fair, MD;  Location: [facility];  Service: Endoscopy;  Laterality: N/A;   HEMORRHOID SURGERY N/A 10/02/2017   Procedure: HEMORRHOIDECTOMY, Anterior and Posterior;  Surgeon: Leighton Ruff, MD;  Location: [facility];  Service: General;  Laterality: N/A;   INCISION AND DRAINAGE PERIRECTAL ABSCESS  03-22-2003     [facility]   INGUINAL HERNIA REPAIR Right 08-14-2004    dr Ninfa [place]  03/ 2015        [facility])   NASAL SINUS SURGERY  07/29/2001   THUMB ARTHROTOMY/  REMOVAL MULTIPLE LOOSE BODIES/ SYNVECTOMY/  RESECTION MUCOID CYST Left 01-16-2010    dr sypher  [place] Bilateral    [place]   Patient Active Problem List   Diagnosis Date Noted   HEARTBURN 05/09/2008   ABDOMINAL BLOATING 05/09/2008   HYPERTENSION 05/05/2008   CONSTIPATION 05/05/2008   ARTHRITIS 05/05/2008   SLEEP APNEA 05/05/2008   HERNIORRHAPHY, HX OF 05/05/2008    REFERRING DIAG: M15.9 (ICD-10-CM) - Polyosteoarthritis, unspecified  THERAPY DIAG:  No diagnosis found.  PERTINENT HISTORY: Low back pain  PRECAUTIONS: N/A  SUBJECTIVE: DN helps, doing som ex and moving better with maybe some improvement  PAIN:  Are you having pain? Yes: NPRS scale: 6/10 Pain location: low back hip and legs R side  Pain description: pain Aggravating factors: when he gets up Relieving factors: walking around     TODAY'S TREATMENT:       Dry Needling to the lumbar area right side, right hip and lateral thigh by M. Albright, PT Nustep L 4 5 min Seated red tband clams 2 sets 10, hip flex 2 sets 10, ball squeeze for abd 2 sets 10 STS from mat 2 sets 3 without UE CGA with cuing to come to full standing, decreased eccentric control Supine  Bridge 10  x Resisted trunk rotation 10 each red tband Clams red tband 10 ,hip flex red tband 10 x PROM BIL LE and trunk- BIL extreme HS tightness- educ in stretching at home    PATIENT EDUCATION: Education details:mvmt and position changes Person educated: Patient Education method: Explanation Education comprehension: verbalized understanding   HOME EXERCISE PROGRAM: [email or web address]   PT Short Term Goals       PT SHORT TERM GOAL #1   Title Will be independent with appropriate progressive HEP    Time 4    Period Weeks    Status New    Target Date 05/28/22      PT SHORT TERM GOAL #2   Title Will have better understanding of postural mechanics    Time 4    Period Weeks    Status New      PT SHORT TERM GOAL #3   Title General ROM in low back and B hips to improve by  50% to show improved motion and to help reduce pain    Time 4    Period Weeks    Status New      PT SHORT TERM GOAL #4   Title Will be able to maintain tandem stance for at least 20 seconds on foam to show improving balance skills    Time 4    Period Weeks    Status New              PT Long Term Goals       PT LONG TERM GOAL #1   Title MMT to be 5/5 globally    Time 8    Period Weeks    Status New    Target Date 06/25/22      PT LONG TERM GOAL #2   Title Will score at least 50 on the Berg to show reduced fall risk    Time 8    Period Weeks    Status New      PT LONG TERM GOAL #3   Title Pain to be no more than 4/10    Time 8    Period Weeks    Status New      PT LONG TERM GOAL #4   Title Will be independent in appropriate gym based program    Time 8    Period Weeks    Status New              Plan     Clinical Impression Statement Pt arrived feeling some better and more receptive to ex and mvmt explained how this will help him feel better, looser and stronger muscles help support joint. Progress of ex with cuing. DN and PROM were all tolerated well   Personal Factors and Comorbidities Age;Fitness;Time since onset of injury/illness/exacerbation    Examination-Activity Limitations Locomotion Level;Transfers;Bend;Sit;Squat;Stairs;Stand;Lift    Examination-Participation Restrictions Community Activity;Shop;Laundry;Yard Work    Rehab Potential Good    PT Frequency 2x / week    PT Duration 8 weeks    PT Treatment/Interventions ADLs/Self Care Home Management;Cryotherapy;Electrical Stimulation;Iontophoresis '4mg'$ /ml Dexamethasone;Moist Heat;Traction;Ultrasound;DME Instruction;Gait training;Stair training;Functional mobility training;Therapeutic activities;Therapeutic exercise;Balance training;Neuromuscular re-education;Patient/family education;Manual techniques;Passive range of motion;Dry needling;Energy conservation;Taping    PT Next Visit Plan  need to work on  general mobility, flexibility and strength. Needs Regenia Skeeter, PTA 05/14/2022, 12:42 PM

## 2022-05-17 ENCOUNTER — Ambulatory Visit: Payer: Medicare Other | Admitting: Physical Therapy

## 2022-05-17 DIAGNOSIS — N183 Chronic kidney disease, stage 3 unspecified: Secondary | ICD-10-CM | POA: Diagnosis not present

## 2022-05-17 DIAGNOSIS — N400 Enlarged prostate without lower urinary tract symptoms: Secondary | ICD-10-CM | POA: Diagnosis not present

## 2022-05-17 DIAGNOSIS — E7800 Pure hypercholesterolemia, unspecified: Secondary | ICD-10-CM | POA: Diagnosis not present

## 2022-05-17 DIAGNOSIS — I10 Essential (primary) hypertension: Secondary | ICD-10-CM | POA: Diagnosis not present

## 2022-05-17 DIAGNOSIS — G8929 Other chronic pain: Secondary | ICD-10-CM | POA: Diagnosis not present

## 2022-06-05 DIAGNOSIS — M5431 Sciatica, right side: Secondary | ICD-10-CM | POA: Diagnosis not present

## 2022-06-05 DIAGNOSIS — E291 Testicular hypofunction: Secondary | ICD-10-CM | POA: Diagnosis not present

## 2022-06-18 DIAGNOSIS — G8929 Other chronic pain: Secondary | ICD-10-CM | POA: Diagnosis not present

## 2022-06-18 DIAGNOSIS — I10 Essential (primary) hypertension: Secondary | ICD-10-CM | POA: Diagnosis not present

## 2022-06-18 DIAGNOSIS — E7800 Pure hypercholesterolemia, unspecified: Secondary | ICD-10-CM | POA: Diagnosis not present

## 2022-06-18 DIAGNOSIS — N183 Chronic kidney disease, stage 3 unspecified: Secondary | ICD-10-CM | POA: Diagnosis not present

## 2022-06-18 DIAGNOSIS — I129 Hypertensive chronic kidney disease with stage 1 through stage 4 chronic kidney disease, or unspecified chronic kidney disease: Secondary | ICD-10-CM | POA: Diagnosis not present

## 2022-06-18 DIAGNOSIS — N400 Enlarged prostate without lower urinary tract symptoms: Secondary | ICD-10-CM | POA: Diagnosis not present

## 2022-06-19 DIAGNOSIS — E291 Testicular hypofunction: Secondary | ICD-10-CM | POA: Diagnosis not present

## 2022-06-20 ENCOUNTER — Ambulatory Visit: Payer: Medicare Other | Admitting: Physical Therapy

## 2022-07-08 DIAGNOSIS — R2689 Other abnormalities of gait and mobility: Secondary | ICD-10-CM | POA: Diagnosis not present

## 2022-07-08 DIAGNOSIS — R630 Anorexia: Secondary | ICD-10-CM | POA: Diagnosis not present

## 2022-07-08 DIAGNOSIS — D649 Anemia, unspecified: Secondary | ICD-10-CM | POA: Diagnosis not present

## 2022-07-10 DIAGNOSIS — G8929 Other chronic pain: Secondary | ICD-10-CM | POA: Diagnosis not present

## 2022-07-10 DIAGNOSIS — E7800 Pure hypercholesterolemia, unspecified: Secondary | ICD-10-CM | POA: Diagnosis not present

## 2022-07-10 DIAGNOSIS — I10 Essential (primary) hypertension: Secondary | ICD-10-CM | POA: Diagnosis not present

## 2022-07-18 DIAGNOSIS — N400 Enlarged prostate without lower urinary tract symptoms: Secondary | ICD-10-CM | POA: Diagnosis not present

## 2022-07-18 DIAGNOSIS — N3281 Overactive bladder: Secondary | ICD-10-CM | POA: Diagnosis not present

## 2022-07-18 DIAGNOSIS — E291 Testicular hypofunction: Secondary | ICD-10-CM | POA: Diagnosis not present

## 2022-08-14 DIAGNOSIS — E291 Testicular hypofunction: Secondary | ICD-10-CM | POA: Diagnosis not present

## 2022-09-11 DIAGNOSIS — R519 Headache, unspecified: Secondary | ICD-10-CM | POA: Diagnosis not present

## 2022-09-11 DIAGNOSIS — N189 Chronic kidney disease, unspecified: Secondary | ICD-10-CM | POA: Diagnosis not present

## 2022-09-11 DIAGNOSIS — I129 Hypertensive chronic kidney disease with stage 1 through stage 4 chronic kidney disease, or unspecified chronic kidney disease: Secondary | ICD-10-CM | POA: Diagnosis not present

## 2022-09-11 DIAGNOSIS — Z882 Allergy status to sulfonamides status: Secondary | ICD-10-CM | POA: Diagnosis not present

## 2022-09-12 DIAGNOSIS — N3281 Overactive bladder: Secondary | ICD-10-CM | POA: Diagnosis not present

## 2022-09-12 DIAGNOSIS — I10 Essential (primary) hypertension: Secondary | ICD-10-CM | POA: Diagnosis not present

## 2022-09-12 DIAGNOSIS — G4733 Obstructive sleep apnea (adult) (pediatric): Secondary | ICD-10-CM | POA: Diagnosis not present

## 2022-09-12 DIAGNOSIS — M5430 Sciatica, unspecified side: Secondary | ICD-10-CM | POA: Diagnosis not present

## 2022-09-12 DIAGNOSIS — K5901 Slow transit constipation: Secondary | ICD-10-CM | POA: Diagnosis not present

## 2022-09-12 DIAGNOSIS — N1832 Chronic kidney disease, stage 3b: Secondary | ICD-10-CM | POA: Diagnosis not present

## 2022-09-12 DIAGNOSIS — M79642 Pain in left hand: Secondary | ICD-10-CM | POA: Diagnosis not present

## 2022-09-12 DIAGNOSIS — N400 Enlarged prostate without lower urinary tract symptoms: Secondary | ICD-10-CM | POA: Diagnosis not present

## 2022-09-12 DIAGNOSIS — N5201 Erectile dysfunction due to arterial insufficiency: Secondary | ICD-10-CM | POA: Diagnosis not present

## 2022-09-12 DIAGNOSIS — K649 Unspecified hemorrhoids: Secondary | ICD-10-CM | POA: Diagnosis not present

## 2022-09-12 DIAGNOSIS — E291 Testicular hypofunction: Secondary | ICD-10-CM | POA: Diagnosis not present

## 2022-09-12 DIAGNOSIS — M109 Gout, unspecified: Secondary | ICD-10-CM | POA: Diagnosis not present

## 2022-09-26 DIAGNOSIS — I10 Essential (primary) hypertension: Secondary | ICD-10-CM | POA: Diagnosis not present

## 2022-09-26 DIAGNOSIS — F39 Unspecified mood [affective] disorder: Secondary | ICD-10-CM | POA: Diagnosis not present

## 2022-09-26 DIAGNOSIS — E291 Testicular hypofunction: Secondary | ICD-10-CM | POA: Diagnosis not present

## 2022-11-18 DIAGNOSIS — W19XXXA Unspecified fall, initial encounter: Secondary | ICD-10-CM | POA: Diagnosis not present

## 2022-11-18 DIAGNOSIS — N5314 Retrograde ejaculation: Secondary | ICD-10-CM | POA: Diagnosis not present

## 2022-11-18 DIAGNOSIS — N3281 Overactive bladder: Secondary | ICD-10-CM | POA: Diagnosis not present

## 2022-11-18 DIAGNOSIS — N5201 Erectile dysfunction due to arterial insufficiency: Secondary | ICD-10-CM | POA: Diagnosis not present

## 2022-11-18 DIAGNOSIS — E291 Testicular hypofunction: Secondary | ICD-10-CM | POA: Diagnosis not present

## 2022-11-18 DIAGNOSIS — N400 Enlarged prostate without lower urinary tract symptoms: Secondary | ICD-10-CM | POA: Diagnosis not present

## 2022-11-18 DIAGNOSIS — I10 Essential (primary) hypertension: Secondary | ICD-10-CM | POA: Diagnosis not present

## 2022-11-18 DIAGNOSIS — M19042 Primary osteoarthritis, left hand: Secondary | ICD-10-CM | POA: Diagnosis not present

## 2022-11-18 DIAGNOSIS — M19041 Primary osteoarthritis, right hand: Secondary | ICD-10-CM | POA: Diagnosis not present

## 2022-11-26 DIAGNOSIS — Z9181 History of falling: Secondary | ICD-10-CM | POA: Diagnosis not present

## 2022-11-26 DIAGNOSIS — M19042 Primary osteoarthritis, left hand: Secondary | ICD-10-CM | POA: Diagnosis not present

## 2022-11-26 DIAGNOSIS — E559 Vitamin D deficiency, unspecified: Secondary | ICD-10-CM | POA: Diagnosis not present

## 2022-11-26 DIAGNOSIS — W19XXXD Unspecified fall, subsequent encounter: Secondary | ICD-10-CM | POA: Diagnosis not present

## 2022-11-26 DIAGNOSIS — M5431 Sciatica, right side: Secondary | ICD-10-CM | POA: Diagnosis not present

## 2022-11-26 DIAGNOSIS — M109 Gout, unspecified: Secondary | ICD-10-CM | POA: Diagnosis not present

## 2022-11-26 DIAGNOSIS — E291 Testicular hypofunction: Secondary | ICD-10-CM | POA: Diagnosis not present

## 2022-11-26 DIAGNOSIS — N5314 Retrograde ejaculation: Secondary | ICD-10-CM | POA: Diagnosis not present

## 2022-11-26 DIAGNOSIS — E785 Hyperlipidemia, unspecified: Secondary | ICD-10-CM | POA: Diagnosis not present

## 2022-11-26 DIAGNOSIS — G8929 Other chronic pain: Secondary | ICD-10-CM | POA: Diagnosis not present

## 2022-11-26 DIAGNOSIS — R2689 Other abnormalities of gait and mobility: Secondary | ICD-10-CM | POA: Diagnosis not present

## 2022-11-26 DIAGNOSIS — D649 Anemia, unspecified: Secondary | ICD-10-CM | POA: Diagnosis not present

## 2022-11-26 DIAGNOSIS — I10 Essential (primary) hypertension: Secondary | ICD-10-CM | POA: Diagnosis not present

## 2022-11-26 DIAGNOSIS — M19041 Primary osteoarthritis, right hand: Secondary | ICD-10-CM | POA: Diagnosis not present

## 2022-11-26 DIAGNOSIS — M160 Bilateral primary osteoarthritis of hip: Secondary | ICD-10-CM | POA: Diagnosis not present

## 2022-11-26 DIAGNOSIS — N5201 Erectile dysfunction due to arterial insufficiency: Secondary | ICD-10-CM | POA: Diagnosis not present

## 2022-11-26 DIAGNOSIS — N400 Enlarged prostate without lower urinary tract symptoms: Secondary | ICD-10-CM | POA: Diagnosis not present

## 2022-11-26 DIAGNOSIS — N3281 Overactive bladder: Secondary | ICD-10-CM | POA: Diagnosis not present

## 2022-11-26 DIAGNOSIS — K5790 Diverticulosis of intestine, part unspecified, without perforation or abscess without bleeding: Secondary | ICD-10-CM | POA: Diagnosis not present

## 2022-11-26 DIAGNOSIS — R413 Other amnesia: Secondary | ICD-10-CM | POA: Diagnosis not present

## 2022-11-26 DIAGNOSIS — G4733 Obstructive sleep apnea (adult) (pediatric): Secondary | ICD-10-CM | POA: Diagnosis not present

## 2022-11-27 DIAGNOSIS — K5790 Diverticulosis of intestine, part unspecified, without perforation or abscess without bleeding: Secondary | ICD-10-CM | POA: Diagnosis not present

## 2022-11-27 DIAGNOSIS — R2689 Other abnormalities of gait and mobility: Secondary | ICD-10-CM | POA: Diagnosis not present

## 2022-11-27 DIAGNOSIS — M160 Bilateral primary osteoarthritis of hip: Secondary | ICD-10-CM | POA: Diagnosis not present

## 2022-11-27 DIAGNOSIS — M109 Gout, unspecified: Secondary | ICD-10-CM | POA: Diagnosis not present

## 2022-11-27 DIAGNOSIS — M5431 Sciatica, right side: Secondary | ICD-10-CM | POA: Diagnosis not present

## 2022-11-27 DIAGNOSIS — I10 Essential (primary) hypertension: Secondary | ICD-10-CM | POA: Diagnosis not present

## 2022-12-11 DIAGNOSIS — I10 Essential (primary) hypertension: Secondary | ICD-10-CM | POA: Diagnosis not present

## 2022-12-11 DIAGNOSIS — R2689 Other abnormalities of gait and mobility: Secondary | ICD-10-CM | POA: Diagnosis not present

## 2022-12-11 DIAGNOSIS — M160 Bilateral primary osteoarthritis of hip: Secondary | ICD-10-CM | POA: Diagnosis not present

## 2022-12-11 DIAGNOSIS — M109 Gout, unspecified: Secondary | ICD-10-CM | POA: Diagnosis not present

## 2022-12-11 DIAGNOSIS — M5431 Sciatica, right side: Secondary | ICD-10-CM | POA: Diagnosis not present

## 2022-12-11 DIAGNOSIS — K5790 Diverticulosis of intestine, part unspecified, without perforation or abscess without bleeding: Secondary | ICD-10-CM | POA: Diagnosis not present

## 2022-12-12 DIAGNOSIS — G8929 Other chronic pain: Secondary | ICD-10-CM | POA: Diagnosis not present

## 2022-12-12 DIAGNOSIS — M160 Bilateral primary osteoarthritis of hip: Secondary | ICD-10-CM | POA: Diagnosis not present

## 2022-12-12 DIAGNOSIS — I10 Essential (primary) hypertension: Secondary | ICD-10-CM | POA: Diagnosis not present

## 2022-12-12 DIAGNOSIS — H409 Unspecified glaucoma: Secondary | ICD-10-CM | POA: Diagnosis not present

## 2022-12-12 DIAGNOSIS — N1832 Chronic kidney disease, stage 3b: Secondary | ICD-10-CM | POA: Diagnosis not present

## 2022-12-12 DIAGNOSIS — E7800 Pure hypercholesterolemia, unspecified: Secondary | ICD-10-CM | POA: Diagnosis not present

## 2022-12-12 DIAGNOSIS — N400 Enlarged prostate without lower urinary tract symptoms: Secondary | ICD-10-CM | POA: Diagnosis not present

## 2022-12-24 DIAGNOSIS — E291 Testicular hypofunction: Secondary | ICD-10-CM | POA: Diagnosis not present

## 2023-01-07 DIAGNOSIS — E291 Testicular hypofunction: Secondary | ICD-10-CM | POA: Diagnosis not present

## 2023-01-23 DIAGNOSIS — E291 Testicular hypofunction: Secondary | ICD-10-CM | POA: Diagnosis not present

## 2023-01-30 DIAGNOSIS — E291 Testicular hypofunction: Secondary | ICD-10-CM | POA: Diagnosis not present

## 2023-02-07 DIAGNOSIS — N3281 Overactive bladder: Secondary | ICD-10-CM | POA: Diagnosis not present

## 2023-02-07 DIAGNOSIS — I10 Essential (primary) hypertension: Secondary | ICD-10-CM | POA: Diagnosis not present

## 2023-02-07 DIAGNOSIS — M19042 Primary osteoarthritis, left hand: Secondary | ICD-10-CM | POA: Diagnosis not present

## 2023-02-07 DIAGNOSIS — R600 Localized edema: Secondary | ICD-10-CM | POA: Diagnosis not present

## 2023-02-07 DIAGNOSIS — M19041 Primary osteoarthritis, right hand: Secondary | ICD-10-CM | POA: Diagnosis not present

## 2023-02-07 DIAGNOSIS — N400 Enlarged prostate without lower urinary tract symptoms: Secondary | ICD-10-CM | POA: Diagnosis not present

## 2023-02-11 DIAGNOSIS — R600 Localized edema: Secondary | ICD-10-CM | POA: Diagnosis not present

## 2023-02-11 DIAGNOSIS — I081 Rheumatic disorders of both mitral and tricuspid valves: Secondary | ICD-10-CM | POA: Diagnosis not present

## 2023-02-13 DIAGNOSIS — N184 Chronic kidney disease, stage 4 (severe): Secondary | ICD-10-CM | POA: Diagnosis not present

## 2023-02-13 DIAGNOSIS — R600 Localized edema: Secondary | ICD-10-CM | POA: Diagnosis not present

## 2023-02-13 DIAGNOSIS — I10 Essential (primary) hypertension: Secondary | ICD-10-CM | POA: Diagnosis not present

## 2023-02-21 DIAGNOSIS — E291 Testicular hypofunction: Secondary | ICD-10-CM | POA: Diagnosis not present

## 2023-03-06 DIAGNOSIS — I10 Essential (primary) hypertension: Secondary | ICD-10-CM | POA: Diagnosis not present

## 2023-03-06 DIAGNOSIS — E291 Testicular hypofunction: Secondary | ICD-10-CM | POA: Diagnosis not present

## 2023-03-06 DIAGNOSIS — R600 Localized edema: Secondary | ICD-10-CM | POA: Diagnosis not present

## 2023-03-06 DIAGNOSIS — R0981 Nasal congestion: Secondary | ICD-10-CM | POA: Diagnosis not present

## 2023-03-19 ENCOUNTER — Ambulatory Visit: Payer: Medicare Other | Attending: Internal Medicine | Admitting: Internal Medicine

## 2023-03-19 ENCOUNTER — Encounter: Payer: Self-pay | Admitting: Internal Medicine

## 2023-03-19 VITALS — BP 128/80 | HR 64 | Ht 69.0 in | Wt 179.8 lb

## 2023-03-19 DIAGNOSIS — I130 Hypertensive heart and chronic kidney disease with heart failure and stage 1 through stage 4 chronic kidney disease, or unspecified chronic kidney disease: Secondary | ICD-10-CM | POA: Insufficient documentation

## 2023-03-19 MED ORDER — FUROSEMIDE 20 MG PO TABS: 40.0000 mg | ORAL_TABLET | Freq: Every day | ORAL | 1 refills | Status: DC

## 2023-03-19 NOTE — Progress Notes (Signed)
Cardiology Office Note:    Date:  03/19/2023   ID:  Marvin West, DOB 07/19/1934, MRN [id number]  PCP:  Lavone Orn, MD   [place] Prov[facility] Cardiologist:  Janina Mayo, MD     Referring MD: Kathalene Frames, *   No chief complaint on file. LE edema  History of Present Illness:    Marvin West is a 89 y.o. male with a hx of arthritis, CKD stage III, OSA on CPAP, HTN referral for c/f heart failure  He was seen by his primary care doctor, Dr. Koleen Nimrod and there was concern for heart failure with LE edema. This started last month. The swelling improved. He started lasix 40 mg daily. His edema improved in the AM. He was taking a medication but does not recall. No swelling in the last few days. He denies PND/orthopnea.  No cardiac w/u prior. No smoking.  He underwent an echo:  report (not the images) conducted at [place]. EF 60-65% moderate LVH, nl RV size, mild-moderate MR, mild-moderate TR,  E/e' 15, RVSP  71 mmhg.  He is a patient at the [place]. Has a PCP there, Dr. Creig Hines. He is at the [place], [place].   Crt 2.03   Past Medical History:  Diagnosis Date   Arthritis    Benign localized prostatic hyperplasia with lower urinary tract symptoms (LUTS)    Chronic back pain    CKD (chronic kidney disease), stage III (HCC)    Diverticulosis    DJD (degenerative joint disease)    ED (erectile dysfunction)    GERD (gastroesophageal reflux disease)    Gout    per last attack over a year 2016 approx.   Hemorrhoids    Hyperlipidemia    Hypertension    Hypogonadism in male    OSA on CPAP    Rectal bleeding    Wears glasses     Past Surgical History:  Procedure Laterality Date   ANAL FISTULOTOMY  07-04-2003     [facility]   and Internal Hemorrhoid Banding   CATARACT EXTRACTION W/ INTRAOCULAR LENS  IMPLANT, BILATERAL  2013 approx.   COLONOSCOPY WITH PROPOFOL N/A 07/27/2013   Procedure: COLONOSCOPY WITH PROPOFOL;  Surgeon: Garlan Fair, MD;  Location: [facility];  Service: Endoscopy;  Laterality: N/A;   HEMORRHOID SURGERY N/A 10/02/2017   Procedure: HEMORRHOIDECTOMY, Anterior and Posterior;  Surgeon: Leighton Ruff, MD;  Location: [facility];  Service: General;  Laterality: N/A;   INCISION AND DRAINAGE PERIRECTAL ABSCESS  03-22-2003     [facility]   INGUINAL HERNIA REPAIR Right 08-14-2004    dr Ninfa [place]  03/ 2015        [facility])   NASAL SINUS SURGERY  07/29/2001   THUMB ARTHROTOMY/  REMOVAL MULTIPLE LOOSE BODIES/ SYNVECTOMY/  RESECTION MUCOID CYST Left 01-16-2010    dr sypher  [facility]   TOE SURGERY Bilateral    [place]    Current Medications: Current Meds  Medication Sig   allopurinol (ZYLOPRIM) 300 MG tablet Take 300 mg by mouth daily.   amLODipine-atorvastatin (CADUET) 5-40 MG per tablet Take 1 tablet by mouth daily.    clindamycin (CLEOCIN T) 1 % lotion Apply topically every other day.   diclofenac sodium (VOLTAREN) 1 % GEL Apply topically 4 (four) times daily as needed.   Diclofenac Sodium 2 % SOLN Place onto the skin every other day.   fluocinonide cream (  LIDEX) [id number] % Apply 1 application topically 2 (two) times daily as needed.    furosemide (LASIX) 20 MG tablet Take 2 tablets (40 mg total) by mouth daily.   gabapentin (NEURONTIN) 300 MG capsule Take 300 mg by mouth every evening.   hydrochlorothiazide (HYDRODIURIL) 25 MG tablet Take 25 mg by mouth every morning.   irbesartan (AVAPRO) 300 MG tablet Take 300 mg by mouth every morning.    methocarbamol (ROBAXIN) 500 MG tablet Take 500 mg by mouth 2 (two) times daily as needed for muscle spasms.   metoprolol succinate (TOPROL-XL) 100 MG 24 hr tablet Take 100 mg by mouth daily. Take with or immediately following a meal.   solifenacin (VESICARE) 5 MG tablet 1 tablet   tadalafil (CIALIS) 20 MG tablet Take 20 mg by mouth daily as needed for erectile dysfunction.    tamsulosin (FLOMAX)  0.4 MG CAPS capsule Take 1 capsule (0.4 mg total) by mouth daily.   testosterone cypionate (DEPOTESTOTERONE CYPIONATE) 200 MG/ML injection Inject 200 mg into the muscle every 14 (fourteen) days.    traMADol (ULTRAM) 50 MG tablet Take 1 tablet (50 mg total) by mouth daily as needed.     Allergies:   Axiron [testosterone], Doxazosin mesylate, Lisinopril, Norvasc [amlodipine besylate], and Sulfa antibiotics   Social History   Socioeconomic History   Marital status: Divorced    Spouse name: Not on file   Number of children: Not on file   Years of education: Not on file   Highest education level: Not on file  Occupational History   Not on file  Tobacco Use   Smoking status: Never   Smokeless tobacco: Never  Vaping Use   Vaping Use: Never used  Substance and Sexual Activity   Alcohol use: Yes    Comment: socially   Drug use: No   Sexual activity: Not on file    Comment: vasectomy  Other Topics Concern   Not on file  Social History Narrative   Not on file   Social Determinants of Health   Financial Resource Strain: Not on file  Food Insecurity: Not on file  Transportation Needs: Not on file  Physical Activity: Not on file  Stress: Not on file  Social Connections: Not on file     Family History: Not pertinent  ROS:   Please see the history of present illness.     All other systems reviewed and are negative.  EKGs/Labs/Other Studies Reviewed:    The following studies were reviewed today:   EKG:  EKG is  ordered today.  The ekg ordered today demonstrates   03/19/2023- NSR, anterior Q wave  Recent Labs: No results found for requested labs within last 365 days.   Recent Lipid Panel No results found for: "CHOL", "TRIG", "HDL", "CHOLHDL", "VLDL", "LDLCALC", "LDLDIRECT"   Risk Assessment/Calculations:    Physical Exam:    VS:   Vitals:   03/19/23 1359  BP: 128/80  Pulse: 64  SpO2: 96%    Wt Readings from Last 3 Encounters:  03/19/23 179 lb 12.8 oz (81.6  kg)  04/14/22 167 lb (75.8 kg)  10/02/17 174 lb 8 oz (79.2 kg)     GEN:  Well nourished, well developed in no acute distress HEENT: Normal NECK: No JVD; No carotid bruits LYMPHATICS: No lymphadenopathy CARDIAC: RRR, +holosystolic murmur, rubs, gallops RESPIRATORY:  Clear to auscultation without rales, wheezing or rhonchi  ABDOMEN: Soft, non-tender, non-distended MUSCULOSKELETAL:  No edema; No deformity  SKIN: Warm and dry NEUROLOGIC:  Alert and oriented x 3 PSYCHIATRIC:  Normal affect   ASSESSMENT:   HFpEF: considering age and CKD, likely has HFpEF. Severe Pulmonary HTN group II, III. Euvolemic today. Dry wt. ~ 179 pounds. Discussed signs of decompensation. Will continue lasix 40 mg daily  HTN: well controlled. continue irbesartan 300 mg daily, HCTz 25 mg daily, and norvasc 5 mg daily  HLD: continue lipitor 40 mg daily  OSA : wearing CPAP PLAN:    In order of problems listed above:  Lasix 40 mg daily  BMET 2 weeks Follow up 6 months  Medication Adjustments/Labs and Tests Ordered: Current medicines are reviewed at length with the patient today.  Concerns regarding medicines are outlined above.  Orders Placed This Encounter  Procedures   Basic Metabolic Panel (BMET)   EKG 12-Lead   Meds ordered this encounter  Medications   furosemide (LASIX) 20 MG tablet    Sig: Take 2 tablets (40 mg total) by mouth daily.    Dispense:  180 tablet    Refill:  1    Patient Instructions  Medication Instructions:   Start Furosemide (Lasix) 40 MG once daily.  *If you need a refill on your cardiac medications before your next appointment, please call your pharmacy*   Lab Work: Please return for Blood Work in 2 weeks. No appointment needed, lab here at the office is open Monday-Friday from 8AM to 4PM and closed daily for lunch from 12:45-1:45.    If you have labs (blood work) drawn today and your tests are completely normal, you will receive your results only by: [place]  (if you have MyChart) OR A paper copy in the mail If you have any lab test that is abnormal or we need to change your treatment, we will call you to review the results.   Testing/Procedures: None ordered   Follow-Up: At [facility], you and your health needs are our priority.  As part of our continuing mission to provide you with exceptional heart care, we have created designated Provider Care Teams.  These Care Teams include your primary Cardiologist (physician) and Advanced Practice Providers (APPs -  Physician Assistants and Nurse Practitioners) who all work together to provide you with the care you need, when you need it.  We recommend signing up for the patient portal called "MyChart".  Sign up information is provided on this After Visit Summary.  MyChart is used to connect with patients for Virtual Visits (Telemedicine).  Patients are able to view lab/test results, encounter notes, upcoming appointments, etc.  Non-urgent messages can be sent to your provider as well.   To learn more about what you can do with MyChart, go to [email or web address].    Your next appointment:   6 month(s)  Provider:   Janina Mayo, MD     Other Instructions Weigh yourself daily and notify your provider if you gain 3 or more pounds over night or if you gain 5 or more pounds in one week.   Signed, Janina Mayo, MD  03/19/2023 2:50 PM    [place]

## 2023-03-19 NOTE — Patient Instructions (Signed)
Medication Instructions:   Start Furosemide (Lasix) 40 MG once daily.  *If you need a refill on your cardiac medications before your next appointment, please call your pharmacy*   Lab Work: Please return for Blood Work in 2 weeks. No appointment needed, lab here at the office is open Monday-Friday from 8AM to 4PM and closed daily for lunch from 12:45-1:45.    If you have labs (blood work) drawn today and your tests are completely normal, you will receive your results only by: [place] (if you have MyChart) OR A paper copy in the mail If you have any lab test that is abnormal or we need to change your treatment, we will call you to review the results.   Testing/Procedures: None ordered   Follow-Up: At [facility], you and your health needs are our priority.  As part of our continuing mission to provide you with exceptional heart care, we have created designated Provider Care Teams.  These Care Teams include your primary Cardiologist (physician) and Advanced Practice Providers (APPs -  Physician Assistants and Nurse Practitioners) who all work together to provide you with the care you need, when you need it.  We recommend signing up for the patient portal called "MyChart".  Sign up information is provided on this After Visit Summary.  MyChart is used to connect with patients for Virtual Visits (Telemedicine).  Patients are able to view lab/test results, encounter notes, upcoming appointments, etc.  Non-urgent messages can be sent to your provider as well.   To learn more about what you can do with MyChart, go to [email or web address].    Your next appointment:   6 month(s)  Provider:   Janina Mayo, MD     Other Instructions Weigh yourself daily and notify your provider if you gain 3 or more pounds over night or if you gain 5 or more pounds in one week.

## 2023-08-26 ENCOUNTER — Ambulatory Visit: Payer: No Typology Code available for payment source | Admitting: Internal Medicine

## 2023-08-28 ENCOUNTER — Ambulatory Visit (INDEPENDENT_AMBULATORY_CARE_PROVIDER_SITE_OTHER): Payer: Medicare HMO | Admitting: Family Medicine

## 2023-08-28 ENCOUNTER — Encounter: Payer: Self-pay | Admitting: Family Medicine

## 2023-08-28 VITALS — BP 159/72 | HR 52 | Temp 98.3°F | Resp 18 | Ht 69.0 in | Wt 158.8 lb

## 2023-08-28 DIAGNOSIS — R634 Abnormal weight loss: Secondary | ICD-10-CM | POA: Diagnosis not present

## 2023-08-28 DIAGNOSIS — R590 Localized enlarged lymph nodes: Secondary | ICD-10-CM

## 2023-08-28 DIAGNOSIS — N184 Chronic kidney disease, stage 4 (severe): Secondary | ICD-10-CM | POA: Insufficient documentation

## 2023-08-28 DIAGNOSIS — Z7689 Persons encountering health services in other specified circumstances: Secondary | ICD-10-CM

## 2023-08-28 DIAGNOSIS — R1319 Other dysphagia: Secondary | ICD-10-CM

## 2023-08-28 DIAGNOSIS — E010 Iodine-deficiency related diffuse (endemic) goiter: Secondary | ICD-10-CM | POA: Diagnosis not present

## 2023-08-28 DIAGNOSIS — G3184 Mild cognitive impairment, so stated: Secondary | ICD-10-CM | POA: Insufficient documentation

## 2023-08-28 DIAGNOSIS — I10 Essential (primary) hypertension: Secondary | ICD-10-CM | POA: Insufficient documentation

## 2023-08-28 DIAGNOSIS — E785 Hyperlipidemia, unspecified: Secondary | ICD-10-CM | POA: Insufficient documentation

## 2023-08-28 DIAGNOSIS — Z7729 Contact with and (suspected ) exposure to other hazardous substances: Secondary | ICD-10-CM | POA: Insufficient documentation

## 2023-08-28 NOTE — Progress Notes (Addendum)
New Patient Office Visit  Subjective    Patient ID: Marvin West, male    DOB: [birth year]  Age: 89 y.o. MRN: [id number]  CC:  Chief Complaint  Patient presents with   Establish Care    Patient is here to establish care with new provider, he states that he does see a provider at the [facility], he states that  he has been having problems swallowing and has soreness on the right side of his throat. Soreness started this morning per patient, swallowing has been an on going issue.     HPI Marvin West presents to establish care. Pt is new to me. Lives alone still driving. He recently moved alone from [place] 3 years ago. He has 2 children adults in [place].   He reports he usually sees [facility] [place]. He use to see [facility].  Pt is unsure of the medicines he's taking. He has requested medication list from the [place] center. Advised pt to bring medication   Pt reports he's been having issues with food getting 'strangled'. It occurs with food and water sometimes. He says sometimes the food and fluids will come up in his nose. He denies blood in the throat. [facility] and is yet to here [facility] from them. He says he had an ultrasound done of his neck 3 weeks ago and is unsure of the results. This was reviewed today and showed . Nonspecific esophageal dysmotility. 2. Small Zenker diverticulum. 3. No high-grade esophageal stricture or mass. 4. No hiatal hernia.  Pt says he has weight loss for many  months now.    Outpatient Encounter Medications as of 08/28/2023  Medication Sig   allopurinol (ZYLOPRIM) 300 MG tablet Take 300 mg by mouth daily.   amLODipine-atorvastatin (CADUET) 5-40 MG per tablet Take 1 tablet by mouth daily.    diclofenac sodium (VOLTAREN) 1 % GEL Apply topically 4 (four) times daily as needed.   Diclofenac Sodium 2 % SOLN Place onto the skin every other day.   fluocinonide cream (LIDEX) 0.05 % Apply 1 application topically 2 (two)  times daily as needed.    furosemide (LASIX) 20 MG tablet Take 2 tablets (40 mg total) by mouth daily.   gabapentin (NEURONTIN) 300 MG capsule Take 300 mg by mouth every evening.   hydrochlorothiazide (HYDRODIURIL) 25 MG tablet Take 25 mg by mouth every morning.   irbesartan (AVAPRO) 300 MG tablet Take 300 mg by mouth every morning.    methocarbamol (ROBAXIN) 500 MG tablet Take 500 mg by mouth 2 (two) times daily as needed for muscle spasms.   metoprolol succinate (TOPROL-XL) 100 MG 24 hr tablet Take 100 mg by mouth daily. Take with or immediately following a meal.   solifenacin (VESICARE) 5 MG tablet 1 tablet   tadalafil (CIALIS) 20 MG tablet Take 20 mg by mouth daily as needed for erectile dysfunction.    tamsulosin (FLOMAX) 0.4 MG CAPS capsule Take 1 capsule (0.4 mg total) by mouth daily.   testosterone cypionate (DEPOTESTOTERONE CYPIONATE) 200 MG/ML injection Inject 200 mg into the muscle every 14 (fourteen) days.    traMADol (ULTRAM) 50 MG tablet Take 1 tablet (50 mg total) by mouth daily as needed.   [DISCONTINUED] clindamycin (CLEOCIN T) 1 % lotion Apply topically every other day.   No facility-administered encounter medications on file as of 08/28/2023.    Past Medical History:  Diagnosis Date   Arthritis    Benign  localized prostatic hyperplasia with lower urinary tract symptoms (LUTS)    Chronic back pain    CKD (chronic kidney disease), stage III (HCC)    Diverticulosis    DJD (degenerative joint disease)    ED (erectile dysfunction)    GERD (gastroesophageal reflux disease)    Gout    per last attack over a year 2016 approx.   Hemorrhoids    Hyperlipidemia    Hypertension    Hypogonadism in male    OSA on CPAP    Rectal bleeding    Wears glasses     Past Surgical History:  Procedure Laterality Date   ANAL FISTULOTOMY  07-04-2003     [facility]   and Internal Hemorrhoid Banding   CATARACT EXTRACTION W/ INTRAOCULAR LENS  IMPLANT, BILATERAL  2013 approx.   COLONOSCOPY WITH  PROPOFOL N/A 07/27/2013   Procedure: COLONOSCOPY WITH PROPOFOL;  Surgeon: Charolett Bumpers, MD;  Location: [facility];  Service: Endoscopy;  Laterality: N/A;   HEMORRHOID SURGERY N/A 10/02/2017   Procedure: HEMORRHOIDECTOMY, Anterior and Posterior;  Surgeon: Romie Levee, MD;  Location: [facility];  Service: General;  Laterality: N/A;   INCISION AND DRAINAGE PERIRECTAL ABSCESS  03-22-2003     [facility]   INGUINAL HERNIA REPAIR Right 08-14-2004    dr Magnus Ivan [facility]   LUMBAR DISECTOMY  03/ 2015        [facility])   NASAL SINUS SURGERY  07/29/2001   THUMB ARTHROTOMY/  REMOVAL MULTIPLE LOOSE BODIES/ SYNVECTOMY/  RESECTION MUCOID CYST Left 01-16-2010    dr sypher  [facility]   TOE SURGERY Bilateral    TRANSURETHRAL RESECTION OF PROSTATE  1983   UVULOPALATOPHARYNGOPLASTY  1993    History reviewed. No pertinent family history.  Social History   Socioeconomic History   Marital status: Divorced    Spouse name: Not on file   Number of children: Not on file   Years of education: Not on file   Highest education level: Not on file  Occupational History   Not on file  Tobacco Use   Smoking status: Never   Smokeless tobacco: Never  Vaping Use   Vaping status: Never Used  Substance and Sexual Activity   Alcohol use: Yes    Comment: socially   Drug use: No   Sexual activity: Not on file    Comment: vasectomy  Other Topics Concern   Not on file  Social History Narrative   Not on file   Social Determinants of Health   Financial Resource Strain: Not on file  Food Insecurity: Not on file  Transportation Needs: Not on file  Physical Activity: Not on file  Stress: Not on file  Social Connections: Unknown (04/30/2022)   Received from [facility]   Social Network    Social Network: Not on file  Intimate Partner Violence: Unknown (03/29/2022)   Received from [facility], [facility]   HITS    Physically Hurt: Not on file    Insult or Talk Down To: Not on file     Threaten Physical Harm: Not on file    Scream or Curse: Not on file    Review of Systems  Constitutional:  Positive for weight loss.  Gastrointestinal:        Dysphagia  All other systems reviewed and are negative.       Objective    BP (!) 159/72   Pulse (!) 52   Temp 98.3 F (36.8 C) (Oral)   Resp 18  Ht 5\' 9"  (1.753 m)   Wt 158 lb 12.8 oz (72 kg)   SpO2 97%   BMI 23.45 kg/m   Physical Exam Vitals and nursing note reviewed.  Constitutional:      Appearance: Normal appearance. He is normal weight.  HENT:     Head: Normocephalic and atraumatic.     Right Ear: External ear normal.     Left Ear: External ear normal.     Nose: Nose normal.     Mouth/Throat:     Mouth: Mucous membranes are moist.     Pharynx: Oropharynx is clear.  Eyes:     Conjunctiva/sclera: Conjunctivae normal.     Pupils: Pupils are equal, round, and reactive to light.  Neck:     Comments: thyromegaly Cardiovascular:     Rate and Rhythm: Normal rate and regular rhythm.     Pulses: Normal pulses.     Heart sounds: Normal heart sounds.  Pulmonary:     Effort: Pulmonary effort is normal.     Breath sounds: Normal breath sounds.  Abdominal:     General: Abdomen is flat. Bowel sounds are normal.  Lymphadenopathy:     Cervical: Cervical adenopathy present.  Skin:    General: Skin is warm.     Capillary Refill: Capillary refill takes less than 2 seconds.  Neurological:     General: No focal deficit present.     Mental Status: He is alert and oriented to person, place, and time. Mental status is at baseline.  Psychiatric:        Mood and Affect: Mood normal.        Behavior: Behavior normal.        Thought Content: Thought content normal.        Judgment: Judgment normal.         Assessment & Plan:   Problem List Items Addressed This Visit   None Visit Diagnoses     Encounter to establish care with new doctor    -  Primary   Lymphadenopathy, cervical       Relevant Orders   CT  SOFT TISSUE NECK W CONTRAST   Basic metabolic panel   Weight loss       Relevant Orders   TSH   T4, free   CT SOFT TISSUE NECK W CONTRAST   Thyromegaly       Relevant Orders   TSH   T4, free   CT SOFT TISSUE NECK W CONTRAST   Esophageal dysphagia          Encounter to establish care with new doctor  Lymphadenopathy, cervical -     CT SOFT TISSUE NECK W CONTRAST; Future -     Basic metabolic panel  Weight loss -     TSH -     T4, free -     CT SOFT TISSUE NECK W CONTRAST; Future  Thyromegaly -     TSH -     T4, free -     CT SOFT TISSUE NECK W CONTRAST; Future  Esophageal dysphagia  Pt has had esophagram done a month ago and this was reviewed today. This showed esophageal dysmotility. He also has some lymphadenopathy and thyromegaly that I would like to investigate further. To proceed with thyroid studies and CT soft tissue neck with contrast. Review of last BMP showed creatinine of 1.8 with GFR 37. Will repeat today and make sure he's able to have contrast dye. He is unsure of medicines he  takes. Advised to follow up with me in 2 weeks for medication reconciliation. Return in about 2 weeks (around 09/11/2023).   Suzan Slick, MD  Addendum: Initially ordered CT neck with contrast to evaluate thyromegaly and lymphadenopathy. BMP results returned and pt with creatinine of 2.8 and GFR of 23. D/C this order and placed order for soft tissue neck/thyroid ultrasound.

## 2023-08-29 ENCOUNTER — Other Ambulatory Visit: Payer: Self-pay | Admitting: Family Medicine

## 2023-08-29 DIAGNOSIS — R590 Localized enlarged lymph nodes: Secondary | ICD-10-CM

## 2023-08-29 DIAGNOSIS — R634 Abnormal weight loss: Secondary | ICD-10-CM

## 2023-08-29 DIAGNOSIS — E010 Iodine-deficiency related diffuse (endemic) goiter: Secondary | ICD-10-CM

## 2023-08-29 LAB — TSH: TSH: 1.34 u[IU]/mL (ref 0.450–4.500)

## 2023-08-29 LAB — BASIC METABOLIC PANEL
BUN/Creatinine Ratio: 13 (ref 10–24)
BUN: 34 mg/dL — ABNORMAL HIGH (ref 8–27)
CO2: 21 mmol/L (ref 20–29)
Calcium: 9.7 mg/dL (ref 8.6–10.2)
Chloride: 101 mmol/L (ref 96–106)
Creatinine, Ser: 2.6 mg/dL — ABNORMAL HIGH (ref 0.76–1.27)
Glucose: 102 mg/dL — ABNORMAL HIGH (ref 70–99)
Potassium: 4.8 mmol/L (ref 3.5–5.2)
Sodium: 138 mmol/L (ref 134–144)
eGFR: 23 mL/min/{1.73_m2} — ABNORMAL LOW (ref >59)

## 2023-08-29 LAB — T4, FREE: Free T4: 1.39 ng/dL (ref 0.82–1.77)

## 2023-09-03 ENCOUNTER — Telehealth: Payer: Self-pay

## 2023-09-03 NOTE — Telephone Encounter (Signed)
Please inform pt his creatinine was 1.7 last year and now it's up to 2.60. Normal range is 0.7-1.2. The higher the number, the worse the kidney function.  Unfortunately I can't see any of [facility] to see if he has a nephrologist or not.

## 2023-09-03 NOTE — Telephone Encounter (Signed)
Patient is aware of message regarding test results

## 2023-09-03 NOTE — Telephone Encounter (Signed)
-----   Message from Suzan Slick sent at 08/29/2023  7:51 AM EDT ----- Please inform pt his kidney function has worsened. He sees the [place]. Please inquire if he has a nephrologist (kidney provider he's established with through the [place]?)

## 2023-09-03 NOTE — Telephone Encounter (Signed)
Spoke with patient and patient is aware of results , patient states that he would like more information on his kidney Function test as far as the numbers and exactly how worse . He states that he probably does see a Nephrologist at the [place] but has no idea what the Dr.'s name is , he would like for a call back to discuss results more as well as possibly get a referral to nephrology if needed.

## 2023-09-11 ENCOUNTER — Ambulatory Visit: Payer: No Typology Code available for payment source | Admitting: Family Medicine

## 2023-09-11 ENCOUNTER — Ambulatory Visit: Payer: No Typology Code available for payment source

## 2023-09-11 DIAGNOSIS — R590 Localized enlarged lymph nodes: Secondary | ICD-10-CM

## 2023-09-11 DIAGNOSIS — E010 Iodine-deficiency related diffuse (endemic) goiter: Secondary | ICD-10-CM

## 2023-09-11 DIAGNOSIS — R634 Abnormal weight loss: Secondary | ICD-10-CM | POA: Diagnosis not present

## 2023-09-12 ENCOUNTER — Ambulatory Visit (INDEPENDENT_AMBULATORY_CARE_PROVIDER_SITE_OTHER): Payer: No Typology Code available for payment source | Admitting: Family Medicine

## 2023-09-12 ENCOUNTER — Encounter: Payer: Self-pay | Admitting: Family Medicine

## 2023-09-12 VITALS — BP 144/77 | HR 74 | Temp 97.7°F | Resp 18 | Ht 69.0 in | Wt 161.2 lb

## 2023-09-12 DIAGNOSIS — E010 Iodine-deficiency related diffuse (endemic) goiter: Secondary | ICD-10-CM

## 2023-09-12 DIAGNOSIS — N184 Chronic kidney disease, stage 4 (severe): Secondary | ICD-10-CM | POA: Diagnosis not present

## 2023-09-12 NOTE — Progress Notes (Signed)
Established Patient Office Visit  Subjective   Patient ID: Marvin West, male    DOB: 12-Jan-1934  Age: 89 y.o. MRN: [id number]  Chief Complaint  Patient presents with   Medical Management of Chronic Issues    Patient is here for a 2 week follow up    HPI  Pt is here for 2 week follow up. He is here due to abnormal kidney function with creatinine of 2.6.  [facility]. Pt has hx of cognitive impairment. He says he has a home health aide that comes to clean his home 3 days a week and has someone to come to cook for him. He lives alone otherwise.   Pt also with hx of thyromegaly. Recent thyroid studies was normal. He was sent for ultrasound and this is pending.  Pt was advised to bring a list of his medicines from the [place]. He forgot to today.  Review of Systems  All other systems reviewed and are negative.    Objective:     There were no vitals taken for this visit. BP Readings from Last 3 Encounters:  09/12/23 (!) 144/77  08/28/23 (!) 159/72  03/19/23 128/80      Physical Exam Vitals and nursing note reviewed.  Constitutional:      Appearance: Normal appearance. He is normal weight.  HENT:     Head: Normocephalic and atraumatic.     Right Ear: External ear normal.     Left Ear: External ear normal.     Nose: Nose normal.     Mouth/Throat:     Mouth: Mucous membranes are moist.     Pharynx: Oropharynx is clear.  Eyes:     Conjunctiva/sclera: Conjunctivae normal.     Pupils: Pupils are equal, round, and reactive to light.  Cardiovascular:     Rate and Rhythm: Normal rate.  Pulmonary:     Effort: Pulmonary effort is normal.  Abdominal:     General: Abdomen is flat. Bowel sounds are normal.  Skin:    General: Skin is warm.     Capillary Refill: Capillary refill takes less than 2 seconds.  Neurological:     General: No focal deficit present.     Mental Status: He is alert and oriented to person, place, and time. Mental  status is at baseline.  Psychiatric:        Mood and Affect: Mood normal.        Behavior: Behavior normal.        Thought Content: Thought content normal.        Judgment: Judgment normal.    No results found for any visits on 09/12/23.     The ASCVD Risk score (Arnett DK, et al., 2019) failed to calculate for the following reasons:   The 2019 ASCVD risk score is only valid for ages 105 to 48    Assessment & Plan:   Problem List Items Addressed This Visit   None  CKD (chronic kidney disease) stage 4, GFR 15-29 ml/min (HCC) -     Ambulatory referral to Nephrology -     Basic metabolic panel  Thyromegaly   To refer to nephrologist for further evaluation and management. Recheck BMP today. For thyromegaly, ultrasound still pending.  Will plan to follow up with pt in 4 weeks. No follow-ups on file.    Suzan Slick, MD

## 2023-09-13 LAB — BASIC METABOLIC PANEL
BUN/Creatinine Ratio: 13 (ref 10–24)
BUN: 34 mg/dL — ABNORMAL HIGH (ref 8–27)
CO2: 23 mmol/L (ref 20–29)
Calcium: 9.4 mg/dL (ref 8.6–10.2)
Chloride: 101 mmol/L (ref 96–106)
Creatinine, Ser: 2.62 mg/dL — ABNORMAL HIGH (ref 0.76–1.27)
Glucose: 94 mg/dL (ref 70–99)
Potassium: 4.7 mmol/L (ref 3.5–5.2)
Sodium: 139 mmol/L (ref 134–144)
eGFR: 23 mL/min/{1.73_m2} — ABNORMAL LOW (ref >59)

## 2023-09-15 ENCOUNTER — Other Ambulatory Visit: Payer: Self-pay | Admitting: Family Medicine

## 2023-09-15 ENCOUNTER — Encounter: Payer: Self-pay | Admitting: Family Medicine

## 2023-09-15 DIAGNOSIS — R269 Unspecified abnormalities of gait and mobility: Secondary | ICD-10-CM | POA: Insufficient documentation

## 2023-09-15 DIAGNOSIS — I272 Pulmonary hypertension, unspecified: Secondary | ICD-10-CM | POA: Insufficient documentation

## 2023-09-15 DIAGNOSIS — N400 Enlarged prostate without lower urinary tract symptoms: Secondary | ICD-10-CM | POA: Insufficient documentation

## 2023-09-15 DIAGNOSIS — L304 Erythema intertrigo: Secondary | ICD-10-CM | POA: Insufficient documentation

## 2023-09-15 DIAGNOSIS — M109 Gout, unspecified: Secondary | ICD-10-CM | POA: Insufficient documentation

## 2023-09-15 DIAGNOSIS — H6120 Impacted cerumen, unspecified ear: Secondary | ICD-10-CM | POA: Insufficient documentation

## 2023-09-15 DIAGNOSIS — N3281 Overactive bladder: Secondary | ICD-10-CM | POA: Insufficient documentation

## 2023-09-15 DIAGNOSIS — H15832 Staphyloma posticum, left eye: Secondary | ICD-10-CM | POA: Insufficient documentation

## 2023-09-15 DIAGNOSIS — I5030 Unspecified diastolic (congestive) heart failure: Secondary | ICD-10-CM | POA: Insufficient documentation

## 2023-09-15 DIAGNOSIS — R7303 Prediabetes: Secondary | ICD-10-CM | POA: Insufficient documentation

## 2023-09-15 DIAGNOSIS — M545 Low back pain, unspecified: Secondary | ICD-10-CM | POA: Insufficient documentation

## 2023-09-15 DIAGNOSIS — E291 Testicular hypofunction: Secondary | ICD-10-CM | POA: Insufficient documentation

## 2023-09-26 ENCOUNTER — Telehealth: Payer: Self-pay

## 2023-09-26 NOTE — Telephone Encounter (Signed)
Patient is aware of results.

## 2023-09-30 ENCOUNTER — Encounter: Payer: Self-pay | Admitting: Internal Medicine

## 2023-09-30 ENCOUNTER — Ambulatory Visit: Payer: No Typology Code available for payment source | Attending: Internal Medicine | Admitting: Internal Medicine

## 2023-09-30 VITALS — BP 138/72 | HR 66 | Ht 69.0 in | Wt 158.6 lb

## 2023-09-30 DIAGNOSIS — I10 Essential (primary) hypertension: Secondary | ICD-10-CM

## 2023-09-30 NOTE — Progress Notes (Unsigned)
Cardiology Office Note:    Date:  09/30/2023   ID:  Marvin West, DOB November 12, 1934, MRN [id number]  PCP:  Suzan Slick, MD   [facility] Providers Cardiologist:  Maisie Fus, MD     Referring MD: Kirby Funk, MD   No chief complaint on file. LE edema  History of Present Illness:    Marvin West is a 89 y.o. male with a hx of arthritis, CKD stage III, OSA on CPAP, HTN referral for c/f heart failure  He was seen by his primary care doctor, Dr. Orson Aloe and there was concern for heart failure with LE edema. This started last month. The swelling improved. He started lasix 40 mg daily. His edema improved in the AM. He was taking a medication but does not recall. No swelling in the last few days. He denies PND/orthopnea.  No cardiac w/u prior. No smoking.  He underwent an echo:  report (not the images) conducted at [facility]. EF 60-65% moderate LVH, nl RV size, mild-moderate MR, mild-moderate TR,  E/e' 15, RVSP  71 mmhg.  He is a patient at the [place]. Has a PCP there, Dr. Marlyne Beards. He is at the [place], [place].   Crt 2.03   Interval Hx 09/30/2023 Marvin West comes in today for a follow up appointment. He denies symptoms of heart failure including PND, orthopnea, or lower extremity edema. He is doing well with lasix 40 milligrams daily. His weight has not changed. BP in good control. Notes has memory issues.   Past Medical History:  Diagnosis Date   Arthritis    Benign localized prostatic hyperplasia with lower urinary tract symptoms (LUTS)    Chronic back pain    CKD (chronic kidney disease), stage III (HCC)    Diverticulosis    DJD (degenerative joint disease)    ED (erectile dysfunction)    GERD (gastroesophageal reflux disease)    Gout    per last attack over a year 2016 approx.   Hemorrhoids    Hyperlipidemia    Hypertension    Hypogonadism in male    OSA on CPAP    Rectal bleeding    Wears glasses     Past Surgical History:  Procedure Laterality Date    ANAL FISTULOTOMY  07-04-2003     [facility]   and Internal Hemorrhoid Banding   CATARACT EXTRACTION W/ INTRAOCULAR LENS  IMPLANT, BILATERAL  2013 approx.   COLONOSCOPY WITH PROPOFOL N/A 07/27/2013   Procedure: COLONOSCOPY WITH PROPOFOL;  Surgeon: Charolett Bumpers, MD;  [facility];  Service: Endoscopy;  Laterality: N/A;   HEMORRHOID SURGERY N/A 10/02/2017   Procedure: HEMORRHOIDECTOMY, Anterior and Posterior;  Surgeon: Romie Levee, MD;  Location: [facility];  Service: General;  Laterality: N/A;   INCISION AND DRAINAGE PERIRECTAL ABSCESS  03-22-2003     [facility]   INGUINAL HERNIA REPAIR Right 08-14-2004    dr Magnus Ivan [facility]   LUMBAR DISECTOMY  03/ 2015        [facility])   NASAL SINUS SURGERY  07/29/2001   THUMB ARTHROTOMY/  REMOVAL MULTIPLE LOOSE BODIES/ SYNVECTOMY/  RESECTION MUCOID CYST Left 01-16-2010    dr sypher  [facility]   TOE SURGERY Bilateral    TRANSURETHRAL RESECTION OF PROSTATE  1983   UVULOPALATOPHARYNGOPLASTY  1993    Current Medications: Current Outpatient Medications on File Prior to Visit  Medication Sig Dispense Refill   allopurinol (ZYLOPRIM) 300 MG tablet Take 300 mg by mouth daily.  amLODipine (NORVASC) 5 MG tablet Take 5 mg by mouth daily.     amLODipine-atorvastatin (CADUET) 5-40 MG per tablet Take 1 tablet by mouth daily.      atorvastatin (LIPITOR) 40 MG tablet Take 40 mg by mouth at bedtime.     calcitRIOL (ROCALTROL) 0.25 MCG capsule Take 0.25 mcg by mouth every other day. Hyperparathyroidism     carvedilol (COREG) 25 MG tablet Take 25 mg by mouth 2 (two) times daily with a meal.     diclofenac sodium (VOLTAREN) 1 % GEL Apply topically 4 (four) times daily as needed.     Diclofenac Sodium 2 % SOLN Place onto the skin every other day.     donepezil (ARICEPT) 5 MG tablet Take 5 mg by mouth at bedtime.     fluocinonide cream (LIDEX) 0.05 % Apply 1 application topically 2 (two) times daily as needed.      furosemide (LASIX) 20 MG tablet Take 2 tablets  (40 mg total) by mouth daily. 180 tablet 1   gabapentin (NEURONTIN) 300 MG capsule Take 300 mg by mouth every evening.     hydrochlorothiazide (HYDRODIURIL) 25 MG tablet Take 25 mg by mouth every morning.     irbesartan (AVAPRO) 300 MG tablet Take 300 mg by mouth every morning.      losartan (COZAAR) 100 MG tablet Take 100 mg by mouth daily.     methocarbamol (ROBAXIN) 500 MG tablet Take 500 mg by mouth 2 (two) times daily as needed for muscle spasms.     metoprolol succinate (TOPROL-XL) 100 MG 24 hr tablet Take 100 mg by mouth daily. Take with or immediately following a meal.     sildenafil (VIAGRA) 100 MG tablet Take 50 mg by mouth as needed.     solifenacin (VESICARE) 5 MG tablet 1 tablet     tadalafil (CIALIS) 20 MG tablet Take 20 mg by mouth daily as needed for erectile dysfunction.      tamsulosin (FLOMAX) 0.4 MG CAPS capsule Take 1 capsule (0.4 mg total) by mouth daily. 10 capsule 0   terazosin (HYTRIN) 2 MG capsule Take 2 mg by mouth 2 (two) times daily.     testosterone cypionate (DEPOTESTOTERONE CYPIONATE) 200 MG/ML injection Inject 200 mg into the muscle every 14 (fourteen) days.      traMADol (ULTRAM) 50 MG tablet Take 1 tablet (50 mg total) by mouth daily as needed. 30 tablet 0   No current facility-administered medications on file prior to visit.     Allergies:   Axiron [testosterone], Doxazosin mesylate, Lisinopril, Norvasc [amlodipine besylate], and Sulfa antibiotics   Social History   Socioeconomic History   Marital status: Divorced    Spouse name: Not on file   Number of children: Not on file   Years of education: Not on file   Highest education level: Not on file  Occupational History   Not on file  Tobacco Use   Smoking status: Never   Smokeless tobacco: Never  Vaping Use   Vaping status: Never Used  Substance and Sexual Activity   Alcohol use: Yes    Comment: socially   Drug use: No   Sexual activity: Not on file    Comment: vasectomy  Other Topics  Concern   Not on file  Social History Narrative   Not on file   Social Determinants of Health   Financial Resource Strain: Not on file  Food Insecurity: Not on file  Transportation Needs: Not on file  Physical Activity:  Not on file  Stress: Not on file  Social Connections: Unknown (04/30/2022)   Received from [facility]   Social Network    Social Network: Not on file     Family History: Not pertinent  ROS:   Please see the history of present illness.     All other systems reviewed and are negative.  EKGs/Labs/Other Studies Reviewed:    The following studies were reviewed today:   EKG:  EKG is  ordered today.  The ekg ordered today demonstrates   03/19/2023- NSR, anterior Q wave  Recent Labs: 08/28/2023: TSH 1.340 09/12/2023: BUN 34; Creatinine, Ser 2.62; Potassium 4.7; Sodium 139   Recent Lipid Panel No results found for: "CHOL", "TRIG", "HDL", "CHOLHDL", "VLDL", "LDLCALC", "LDLDIRECT"   Risk Assessment/Calculations:    Physical Exam:    VS:   Vitals:   09/30/23 1654  BP: 138/72  Pulse: 66  SpO2: 97%       Wt Readings from Last 3 Encounters:  09/12/23 161 lb 3.2 oz (73.1 kg)  08/28/23 158 lb 12.8 oz (72 kg)  03/19/23 179 lb 12.8 oz (81.6 kg)     Physical Exam Gen: well appearing Neuro: alert and oriented CV: r,r,r no murmurs.  Pulm: CLAB Abd: non distended Ext: No LE edema Skin: warm and well perfused Psych: normal mood   ASSESSMENT:   HFpEF: considering age and CKD now stage IV, likely has HFpEF. Severe Pulmonary HTN group II, III. Euvolemic today. Dry wt. ~ 158 pounds (taking a protein shake, has lost weight). Discussed signs of decompensation. Can continue lasix 40 mg daily  HTN: well controlled. continue irbesartan 300 mg daily, HCTz 25 mg daily, and norvasc 5 mg daily  HLD: continue lipitor 40 mg daily  OSA : wears CPAP sometimes PLAN:    In order of problems listed above:   Follow up 6 months  Medication  Adjustments/Labs and Tests Ordered: Current medicines are reviewed at length with the patient today.  Concerns regarding medicines are outlined above.  No orders of the defined types were placed in this encounter.  No orders of the defined types were placed in this encounter.   There are no Patient Instructions on file for this visit.   Signed, Maisie Fus, MD  09/30/2023 7:52 AM    [facility]

## 2023-09-30 NOTE — Patient Instructions (Addendum)
Medication Instructions:  NO CHANGES    *If you need a refill on your cardiac medications before your next appointment, please call your pharmacy*   Lab Work: NONE    If you have labs (blood work) drawn today and your tests are completely normal, you will receive your results only by: MyChart Message (if you have MyChart) OR A paper copy in the mail If you have any lab test that is abnormal or we need to change your treatment, we will call you to review the results.   Testing/Procedures:  NONE   Follow-Up: At [facility], you and your health needs are our priority.  As part of our continuing mission to provide you with exceptional heart care, we have created designated Provider Care Teams.  These Care Teams include your primary Cardiologist (physician) and Advanced Practice Providers (APPs -  Physician Assistants and Nurse Practitioners) who all work together to provide you with the care you need, when you need it.  We recommend signing up for the patient portal called "MyChart".  Sign up information is provided on this After Visit Summary.  MyChart is used to connect with patients for Virtual Visits (Telemedicine).  Patients are able to view lab/test results, encounter notes, upcoming appointments, etc.  Non-urgent messages can be sent to your provider as well.   To learn more about what you can do with MyChart, go to [email or web address].    Your next appointment:   6 month(s): March 16, 2024 AT 3:00PM  The format for your next appointment:   In Person  Provider:   Maisie Fus, MD

## 2023-10-14 ENCOUNTER — Encounter: Payer: Self-pay | Admitting: Family Medicine

## 2023-10-14 ENCOUNTER — Ambulatory Visit (INDEPENDENT_AMBULATORY_CARE_PROVIDER_SITE_OTHER): Payer: Medicare HMO | Admitting: Family Medicine

## 2023-10-14 VITALS — BP 146/72 | HR 71 | Temp 98.0°F | Resp 18 | Ht 69.0 in | Wt 161.0 lb

## 2023-10-14 DIAGNOSIS — G8929 Other chronic pain: Secondary | ICD-10-CM

## 2023-10-14 DIAGNOSIS — M545 Low back pain, unspecified: Secondary | ICD-10-CM

## 2023-10-14 DIAGNOSIS — N184 Chronic kidney disease, stage 4 (severe): Secondary | ICD-10-CM | POA: Diagnosis not present

## 2023-10-14 DIAGNOSIS — I10 Essential (primary) hypertension: Secondary | ICD-10-CM | POA: Diagnosis not present

## 2023-10-14 DIAGNOSIS — K046 Periapical abscess with sinus: Secondary | ICD-10-CM | POA: Insufficient documentation

## 2023-10-14 DIAGNOSIS — Z7409 Other reduced mobility: Secondary | ICD-10-CM | POA: Insufficient documentation

## 2023-10-14 NOTE — Progress Notes (Signed)
Established Patient Office Visit  Subjective   Patient ID: Marvin West, male    DOB: Dec 30, 1933  Age: 89 y.o. MRN: [id number]  Chief Complaint  Patient presents with   Medical Management of Chronic Issues    Patient is here for a 4 week follow up    HPI  Hypertension Pt taking medicines as directed. He is Amlodipine 5mg , Coreg 25 mg BID, Lasix 40mg , Losartan 100mg  daily. He is compliant with medicines. Denies chest pains or SOB. He has hx of CKD 4. He reports he saw a nephrologist through the [place] but would like a second opinion. Referral is pending.  He has chronic low back pain. Using Lidoderm patches. Requests exercise handouts to do at home.  He has received his flu and covid vaccines last week at [facility] center.   Review of Systems  Musculoskeletal:  Positive for back pain.  All other systems reviewed and are negative.    Objective:     BP (!) 146/72   Pulse 71   Temp 98 F (36.7 C) (Oral)   Resp 18   Ht 5\' 9"  (1.753 m)   Wt 161 lb (73 kg)   SpO2 100%   BMI 23.78 kg/m  BP Readings from Last 3 Encounters:  10/14/23 (!) 146/72  09/30/23 138/72  09/12/23 (!) 144/77      Physical Exam Vitals and nursing note reviewed.  Constitutional:      Appearance: Normal appearance. He is normal weight.  HENT:     Head: Normocephalic and atraumatic.     Right Ear: External ear normal.     Left Ear: External ear normal.     Nose: Nose normal.     Mouth/Throat:     Mouth: Mucous membranes are moist.     Pharynx: Oropharynx is clear.  Eyes:     Conjunctiva/sclera: Conjunctivae normal.     Pupils: Pupils are equal, round, and reactive to light.  Cardiovascular:     Rate and Rhythm: Normal rate and regular rhythm.  Pulmonary:     Effort: Pulmonary effort is normal.     Breath sounds: Normal breath sounds.  Skin:    General: Skin is warm.     Capillary Refill: Capillary refill takes less than 2 seconds.  Neurological:     General: No focal deficit present.     Mental  Status: He is alert and oriented to person, place, and time. Mental status is at baseline.  Psychiatric:        Mood and Affect: Mood normal.        Behavior: Behavior normal.        Thought Content: Thought content normal.        Judgment: Judgment normal.    No results found for any visits on 10/14/23.  Last metabolic panel Lab Results  Component Value Date   GLUCOSE 94 09/12/2023   NA 139 09/12/2023   K 4.7 09/12/2023   CL 101 09/12/2023   CO2 23 09/12/2023   BUN 34 (H) 09/12/2023   CREATININE 2.62 (H) 09/12/2023   EGFR 23 (L) 09/12/2023   CALCIUM 9.4 09/12/2023      The ASCVD Risk score (Arnett DK, et al., 2019) failed to calculate for the following reasons:   The 2019 ASCVD risk score is only valid for ages 29 to 30    Assessment & Plan:   Problem List Items Addressed This Visit   None Primary hypertension  CKD (chronic kidney disease) stage 4,  GFR 15-29 ml/min (HCC)  Chronic bilateral low back pain without sciatica   Continue regimen for blood pressure for now. Working on Microbiologist and consultation. For chronic back pain, sent home with stretches. Can continue using Lidoderm patches prn for pain To see in 6 months sooner prn.   No follow-ups on file.  Total time spent with patient today 25 minutes. This includes reviewing records, evaluating the patient and coordinating care. Face-to-face time >50%.   Suzan Slick, MD

## 2023-12-30 ENCOUNTER — Telehealth: Payer: Self-pay | Admitting: Family Medicine

## 2023-12-30 NOTE — Telephone Encounter (Signed)
 error

## 2023-12-30 NOTE — Telephone Encounter (Signed)
 Please call and triage his concerns

## 2023-12-30 NOTE — Telephone Encounter (Signed)
 Patient came by to speak with Provider in regards to refilling a Rx (Cialis), Patient spoke with [employer] , front desk was advised to tell patient that Dr. Wyline Mood did not fill this prescription he would need to contact the [place] where Prescription is filled

## 2023-12-30 NOTE — Telephone Encounter (Signed)
 Copied from CRM [id number]. Topic: General - Other >> Dec 29, 2023  3:53 PM Donita Brooks wrote: Reason for CRM: pt would like to speak with his provider

## 2023-12-31 ENCOUNTER — Ambulatory Visit: Payer: No Typology Code available for payment source | Admitting: Family Medicine

## 2024-02-25 IMAGING — DX DG HIP (WITH OR WITHOUT PELVIS) 2-3V*R*
3 series · 3 of 3 positions shown · non-contrast
Comparison: Pelvis and right hip radiographs 02/27/2022

CLINICAL DATA: Right hip pain for 1 month.  No known injury.

EXAM:
DG HIP (WITH OR WITHOUT PELVIS) 2-3V RIGHT

[pelvis ap]
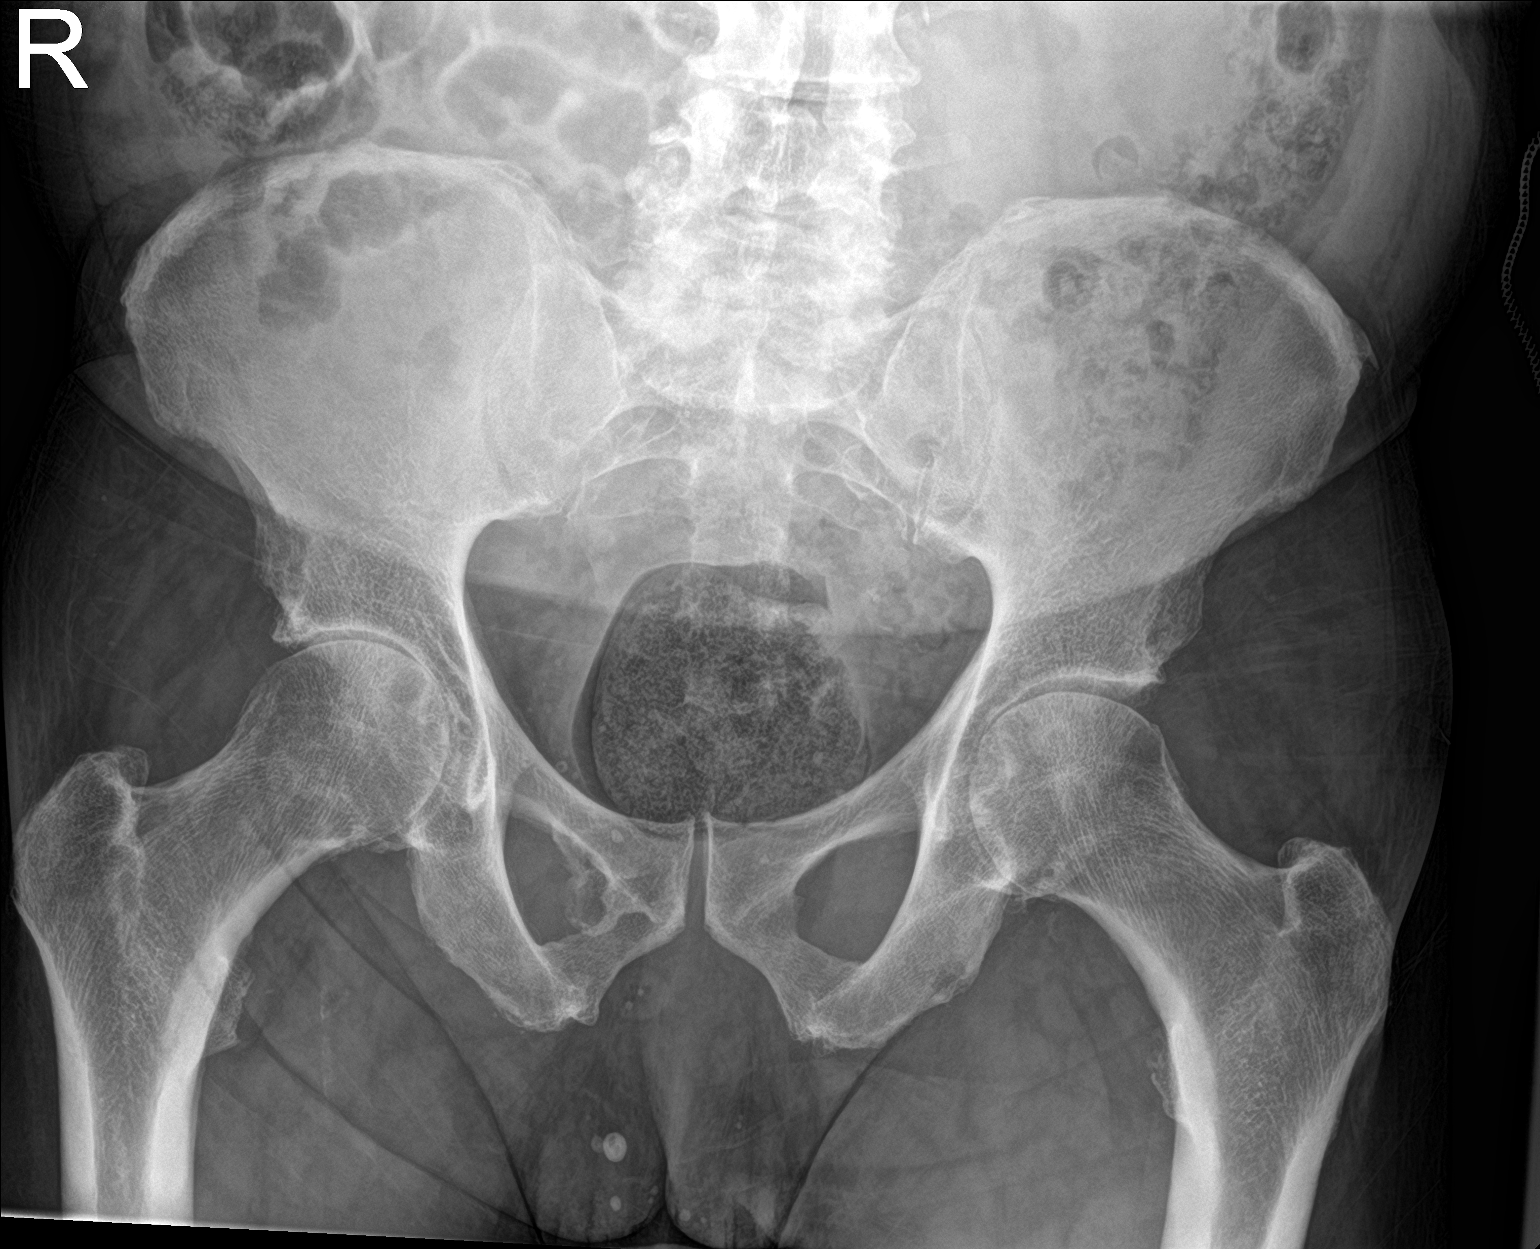

[hip ap]
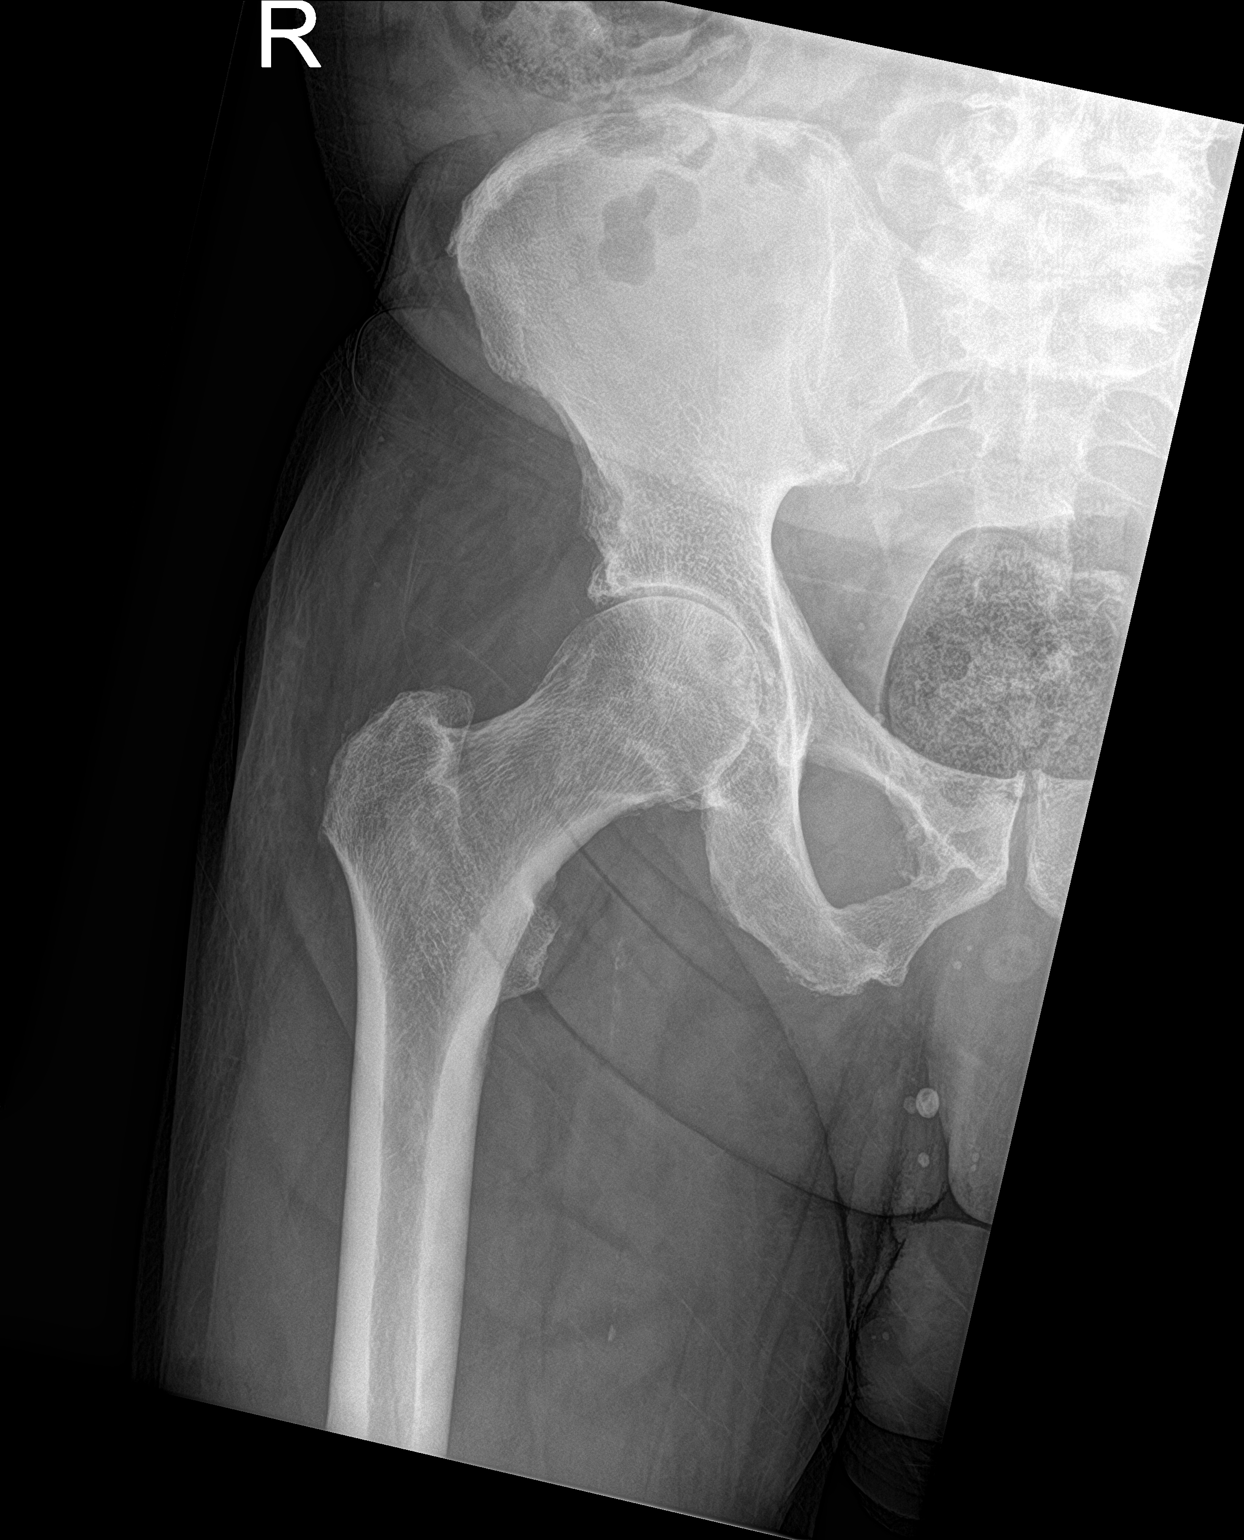

[hip lat]
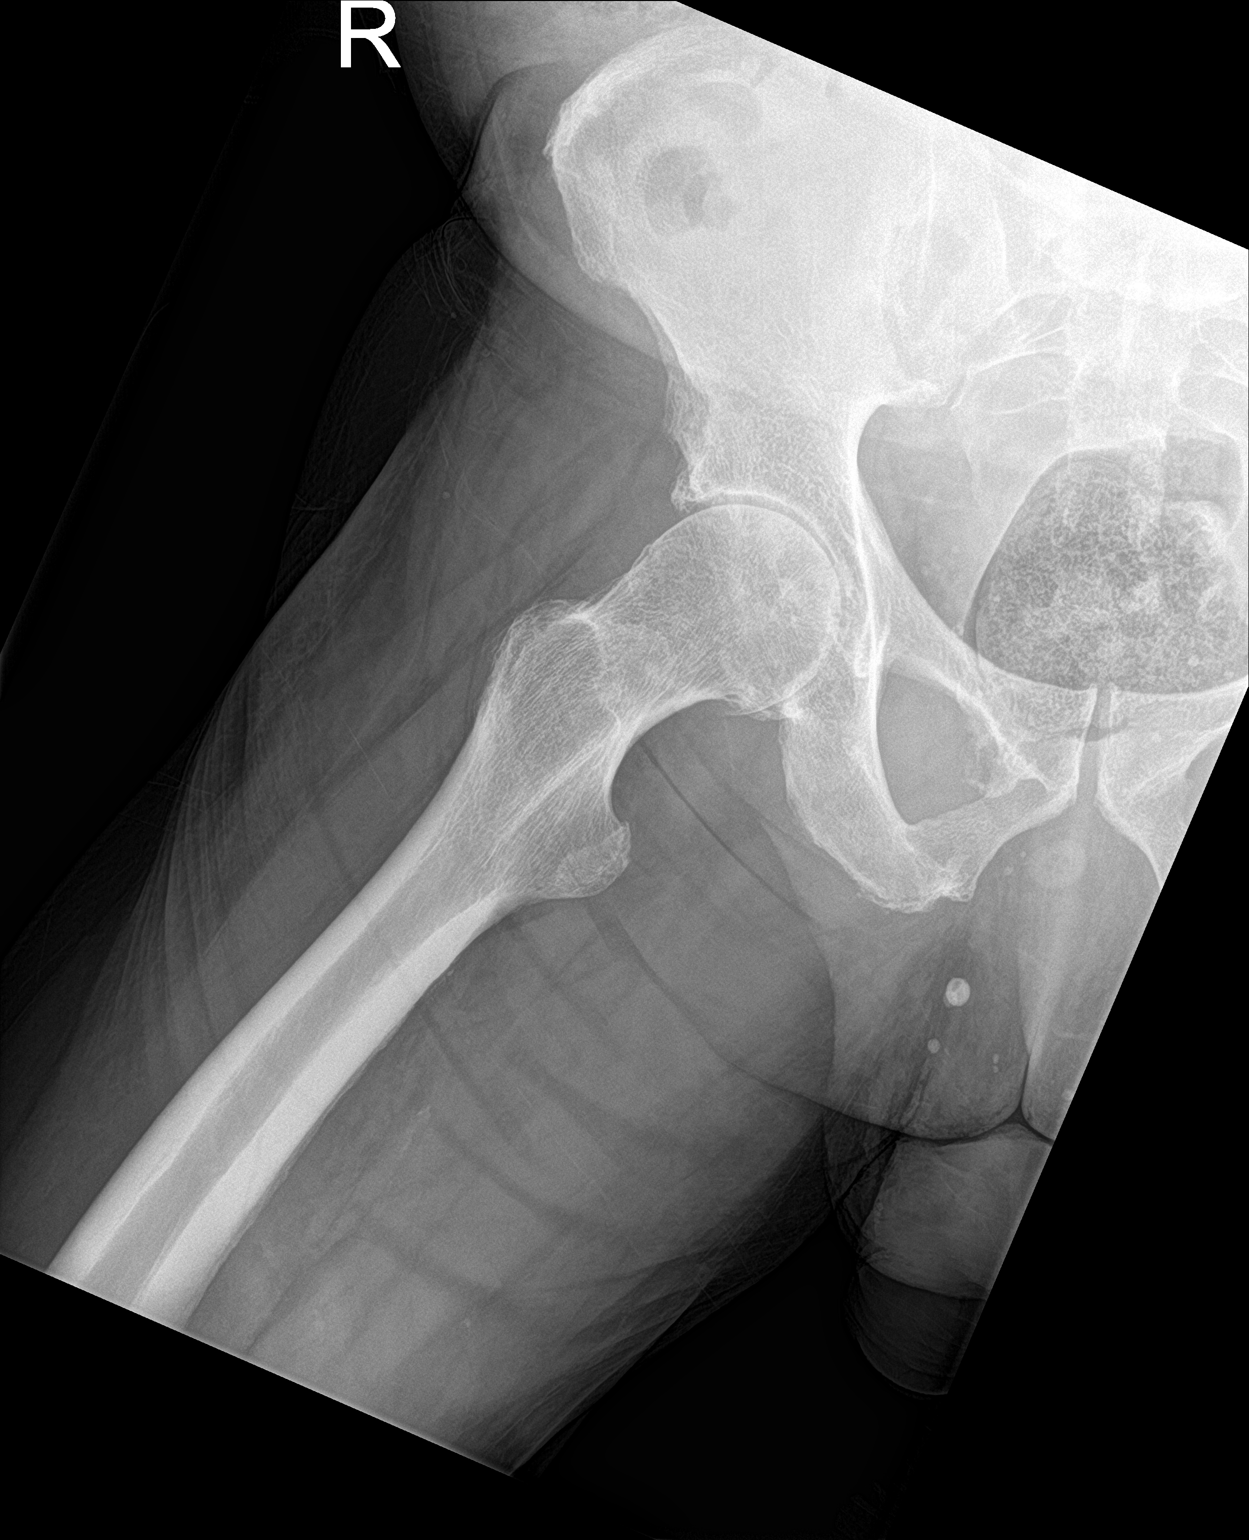

[3 of 3 positions shown; findings below may reference images not displayed]

FINDINGS: Severe bilateral superomedial femoroacetabular joint space
narrowing. Mild right femoral head-neck junction and mild bilateral
superolateral acetabular degenerative osteophytes. Mild bilateral
sacroiliac joint subchondral sclerosis. Mild superior pubic
symphysis degenerative spurring. No acute fracture is seen. No
dislocation.
IMPRESSION: Severe bilateral femoroacetabular osteoarthritis. No acute fracture.

## 2024-03-16 ENCOUNTER — Ambulatory Visit: Payer: No Typology Code available for payment source | Attending: Internal Medicine | Admitting: Internal Medicine

## 2024-04-13 ENCOUNTER — Ambulatory Visit: Payer: No Typology Code available for payment source | Admitting: Family Medicine

## 2024-04-21 ENCOUNTER — Telehealth: Payer: Self-pay | Admitting: Family Medicine

## 2024-04-21 NOTE — Telephone Encounter (Signed)
 Returned patients call no answer LVM for patient to return call .

## 2024-04-21 NOTE — Telephone Encounter (Signed)
 Copied from CRM [id number]. Topic: General - Call Back - No Documentation >> Apr 21, 2024 11:07 AM Marvin West wrote: Patient would like a call back from his pcp >> Apr 21, 2024 11:43 AM Marvin West wrote: Patient wanted to speak to you about the reason he canceled his appointment that was for tomorrow. Patient stated that he already stated the issues to someone else. (CRM)  and they sent over a email stating that the patient would like a call from their PCP.

## 2024-04-22 ENCOUNTER — Ambulatory Visit: Admitting: Family Medicine
